# Patient Record
Sex: Male | Born: 1945
Health system: Southern US, Community
[De-identification: ages and names within clinical notes are randomized; demographics above are authoritative.]

## PROBLEM LIST (undated history)

## (undated) DIAGNOSIS — H269 Unspecified cataract: Secondary | ICD-10-CM

## (undated) DIAGNOSIS — I10 Essential (primary) hypertension: Secondary | ICD-10-CM

## (undated) DIAGNOSIS — E559 Vitamin D deficiency, unspecified: Secondary | ICD-10-CM

## (undated) DIAGNOSIS — Z860100 Personal history of colon polyps, unspecified: Secondary | ICD-10-CM

## (undated) DIAGNOSIS — M542 Cervicalgia: Secondary | ICD-10-CM

## (undated) DIAGNOSIS — E785 Hyperlipidemia, unspecified: Secondary | ICD-10-CM

## (undated) DIAGNOSIS — M1990 Unspecified osteoarthritis, unspecified site: Secondary | ICD-10-CM

## (undated) DIAGNOSIS — D696 Thrombocytopenia, unspecified: Secondary | ICD-10-CM

## (undated) HISTORY — PX: CATARACT EXTRACTION: SUR2

## (undated) HISTORY — DX: Personal history of colonic polyps: Z86.010

## (undated) HISTORY — DX: Vitamin D deficiency, unspecified: E55.9

## (undated) HISTORY — DX: Personal history of colon polyps, unspecified: Z86.0100

## (undated) HISTORY — DX: Cervicalgia: M54.2

## (undated) HISTORY — DX: Hyperlipidemia, unspecified: E78.5

## (undated) HISTORY — PX: CARDIAC CATHETERIZATION: SHX172

## (undated) HISTORY — DX: Essential (primary) hypertension: I10

## (undated) HISTORY — DX: Thrombocytopenia, unspecified: D69.6

## (undated) HISTORY — DX: Unspecified cataract: H26.9

---

## 2013-07-14 DIAGNOSIS — I10 Essential (primary) hypertension: Secondary | ICD-10-CM | POA: Diagnosis not present

## 2013-07-14 DIAGNOSIS — D126 Benign neoplasm of colon, unspecified: Secondary | ICD-10-CM | POA: Diagnosis not present

## 2013-07-14 DIAGNOSIS — Z86010 Personal history of colonic polyps: Secondary | ICD-10-CM | POA: Diagnosis not present

## 2013-08-27 DIAGNOSIS — Z23 Encounter for immunization: Secondary | ICD-10-CM | POA: Diagnosis not present

## 2013-08-31 DIAGNOSIS — H2510 Age-related nuclear cataract, unspecified eye: Secondary | ICD-10-CM | POA: Diagnosis not present

## 2013-08-31 DIAGNOSIS — H268 Other specified cataract: Secondary | ICD-10-CM | POA: Diagnosis not present

## 2013-09-15 DIAGNOSIS — D485 Neoplasm of uncertain behavior of skin: Secondary | ICD-10-CM | POA: Diagnosis not present

## 2013-09-15 DIAGNOSIS — Z85828 Personal history of other malignant neoplasm of skin: Secondary | ICD-10-CM | POA: Diagnosis not present

## 2013-09-15 DIAGNOSIS — L821 Other seborrheic keratosis: Secondary | ICD-10-CM | POA: Diagnosis not present

## 2013-09-15 DIAGNOSIS — L570 Actinic keratosis: Secondary | ICD-10-CM | POA: Diagnosis not present

## 2013-11-04 DIAGNOSIS — E785 Hyperlipidemia, unspecified: Secondary | ICD-10-CM | POA: Diagnosis not present

## 2013-11-09 DIAGNOSIS — I10 Essential (primary) hypertension: Secondary | ICD-10-CM | POA: Diagnosis not present

## 2013-11-09 DIAGNOSIS — E559 Vitamin D deficiency, unspecified: Secondary | ICD-10-CM | POA: Diagnosis not present

## 2013-11-09 DIAGNOSIS — E785 Hyperlipidemia, unspecified: Secondary | ICD-10-CM | POA: Diagnosis not present

## 2013-11-09 DIAGNOSIS — Z0000 Encounter for general adult medical examination without abnormal findings: Secondary | ICD-10-CM | POA: Diagnosis not present

## 2014-03-16 DIAGNOSIS — D2360 Other benign neoplasm of skin of unspecified upper limb, including shoulder: Secondary | ICD-10-CM | POA: Diagnosis not present

## 2014-03-16 DIAGNOSIS — L255 Unspecified contact dermatitis due to plants, except food: Secondary | ICD-10-CM | POA: Diagnosis not present

## 2014-03-16 DIAGNOSIS — Z85828 Personal history of other malignant neoplasm of skin: Secondary | ICD-10-CM | POA: Diagnosis not present

## 2014-03-16 DIAGNOSIS — L570 Actinic keratosis: Secondary | ICD-10-CM | POA: Diagnosis not present

## 2014-05-03 DIAGNOSIS — E785 Hyperlipidemia, unspecified: Secondary | ICD-10-CM | POA: Diagnosis not present

## 2014-05-03 DIAGNOSIS — E559 Vitamin D deficiency, unspecified: Secondary | ICD-10-CM | POA: Diagnosis not present

## 2014-05-03 DIAGNOSIS — Z125 Encounter for screening for malignant neoplasm of prostate: Secondary | ICD-10-CM | POA: Diagnosis not present

## 2014-05-10 DIAGNOSIS — Z0000 Encounter for general adult medical examination without abnormal findings: Secondary | ICD-10-CM | POA: Diagnosis not present

## 2014-08-20 DIAGNOSIS — Z23 Encounter for immunization: Secondary | ICD-10-CM | POA: Diagnosis not present

## 2014-10-15 DIAGNOSIS — L821 Other seborrheic keratosis: Secondary | ICD-10-CM | POA: Diagnosis not present

## 2014-10-15 DIAGNOSIS — L570 Actinic keratosis: Secondary | ICD-10-CM | POA: Diagnosis not present

## 2014-10-15 DIAGNOSIS — D225 Melanocytic nevi of trunk: Secondary | ICD-10-CM | POA: Diagnosis not present

## 2014-10-15 DIAGNOSIS — L820 Inflamed seborrheic keratosis: Secondary | ICD-10-CM | POA: Diagnosis not present

## 2014-10-15 DIAGNOSIS — D1801 Hemangioma of skin and subcutaneous tissue: Secondary | ICD-10-CM | POA: Diagnosis not present

## 2014-10-15 DIAGNOSIS — C44519 Basal cell carcinoma of skin of other part of trunk: Secondary | ICD-10-CM | POA: Diagnosis not present

## 2014-12-13 ENCOUNTER — Ambulatory Visit: Payer: Medicare Other | Admitting: Internal Medicine

## 2014-12-17 ENCOUNTER — Ambulatory Visit (INDEPENDENT_AMBULATORY_CARE_PROVIDER_SITE_OTHER): Payer: Medicare Other | Admitting: Internal Medicine

## 2014-12-17 ENCOUNTER — Encounter: Payer: Self-pay | Admitting: Internal Medicine

## 2014-12-17 VITALS — BP 118/70 | HR 60 | Temp 97.1°F | Resp 18 | Ht 73.78 in | Wt 190.6 lb

## 2014-12-17 DIAGNOSIS — D696 Thrombocytopenia, unspecified: Secondary | ICD-10-CM | POA: Diagnosis not present

## 2014-12-17 DIAGNOSIS — Z860100 Personal history of colon polyps, unspecified: Secondary | ICD-10-CM | POA: Insufficient documentation

## 2014-12-17 DIAGNOSIS — Z7189 Other specified counseling: Secondary | ICD-10-CM | POA: Insufficient documentation

## 2014-12-17 DIAGNOSIS — E559 Vitamin D deficiency, unspecified: Secondary | ICD-10-CM | POA: Diagnosis not present

## 2014-12-17 DIAGNOSIS — E785 Hyperlipidemia, unspecified: Secondary | ICD-10-CM | POA: Insufficient documentation

## 2014-12-17 DIAGNOSIS — I10 Essential (primary) hypertension: Secondary | ICD-10-CM | POA: Diagnosis not present

## 2014-12-17 NOTE — Progress Notes (Signed)
Patient ID: Brandon [name], male   DOB: 03-12-1946, 69 y.o.   MRN: [id number]   Location:  [facility]  Code Status: full code at present--was discussed in detail and says if he was resuscitated and did not recover well, then he would expect his living will to be put into effect  Advanced Directive information Does patient have an advance directive?: Yes, Type of Advance Directive: [place], Does patient want to make changes to advanced directive?: No - Patient declined   Allergies  Allergen Reactions  . Niaspan [Niacin Er]   . Zocor [Simvastatin]     Chief Complaint  Patient presents with  . Establish Care    HPI: Patient is a 69 y.o. male seen in the office today to establish with the practice.  Moved from [place], [place] in early June.  Had been living rurally far from hospitals and they have a son in [place].  Prior PCP Pamala Hurry.  Pt is a retired Gaffer.  He is now in walking distance.    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  Has been seen at [facility] previously.    2005-7ish, was hospitalized with irregular heart rate.  Thinks he got medication and it went back into sinus rhythm.  About a year or two later, felt funny with a pressure in the chest and had a cath at that time.  No significant blockages per pt.  Monitored.  Got a cardiologist he followed with for 2-3 years.  No recurrence since.  Primary care has put him on bp and cholesterol medication.  On hctz in addition to lisinopril and has been at goal since.  Pt's wife noted some memory loss when he was on zocor.  Something new started and he got hives (?this was the niacin).  Doing fenofibrate after that and has tolerated it.    Colonoscopy:  Has his records for those--2011 with polyps. 2014 was told to return in 5 years (2019). We need those records also.    Dermatology:  Martin Majestic to one and to sue 2 wks of efudex cream.    Uses some  [facility] rx meds.  Advised to reorder his meds.    Concerned about short term memory loss.  Has living will and hcpoa he and his wife just did.  Says he's in good health.  Asks about DNR.  His mother had circulatory problems, ended up on HD, was having mini strokes.  Decided to stop HD.  His father committed suicide.  Pt does not want to linger, does not want to be a burden, and wants to maintain good quality of life.  If he has dementia, he wants the plug pulled asap.    Review of Systems:  Review of Systems  Constitutional: Negative for fever, chills and malaise/fatigue.  HENT: Negative for hearing loss.   Eyes:       Wears glasses  Respiratory: Negative for shortness of breath.   Cardiovascular: Negative for chest pain and leg swelling.  Gastrointestinal: Negative for abdominal pain, constipation, blood in stool and melena.  Genitourinary: Negative for dysuria.  Musculoskeletal: Negative for falls.  Skin: Negative for rash.  Neurological: Negative for dizziness, loss of consciousness, weakness and headaches.  Psychiatric/Behavioral: Positive for memory loss. Negative for depression.    Past Medical History  Diagnosis Date  . Hypertension   . Vitamin D deficiency   . Thrombocytopenia   . History of colonic polyps   .  Hyperlipidemia     No past surgical history on file.  Social History:   reports that he has never smoked. He does not have any smokeless tobacco history on file. He reports that he drinks alcohol. He reports that he does not use illicit drugs.  Family History  Problem Relation Age of Onset  . Clotting disorder Father   . Suicidality Father   . High Cholesterol Mother   . Renal Disease Mother   . Stroke Mother 33    Medications: Patient's Medications  New Prescriptions   No medications on file  Previous Medications   ASPIRIN EC 81 MG TABLET    Take 81 mg by mouth daily.   CHOLECALCIFEROL (VITAMIN D3) 2000 UNITS TABS    Take 1 tablet by mouth daily.     FENOFIBRATE 160 MG TABLET    Take 160 mg by mouth daily.   FLAXSEED, LINSEED, (FLAX SEED OIL) 1000 MG CAPS    Take 1 capsule by mouth daily.    FLUOROURACIL (EFUDEX) 5 % CREAM    Apply topically as needed.   HYDROCHLOROTHIAZIDE (HYDRODIURIL) 25 MG TABLET    Take 25 mg by mouth daily.   LISINOPRIL (PRINIVIL,ZESTRIL) 40 MG TABLET    Take 40 mg by mouth daily.   VITAMIN B-12 (CYANOCOBALAMIN) 100 MCG TABLET    Take 100 mcg by mouth daily.  Modified Medications   No medications on file  Discontinued Medications   No medications on file     Physical Exam: Filed Vitals:   12/17/14 1038  BP: 118/70  Pulse: 60  Temp: 97.1 F (36.2 C)  TempSrc: Oral  Resp: 18  Height: 6' 1.78" (1.874 m)  Weight: 190 lb 9.6 oz (86.456 kg)  SpO2: 97%  Physical Exam  Constitutional: He is oriented to person, place, and time. He appears well-developed and well-nourished. No distress.  Cardiovascular: Normal rate, regular rhythm, normal heart sounds and intact distal pulses.   Pulmonary/Chest: Effort normal and breath sounds normal. No respiratory distress.  Abdominal: Bowel sounds are normal.  Musculoskeletal: Normal range of motion.  Ambulatory w/o assistive device  Neurological: He is alert and oriented to person, place, and time.  Skin: Skin is warm and dry.  Psychiatric:  anxious   Labs reviewed: Need records  Assessment/Plan 1. Essential hypertension -bp at goal with hctz, lisinopril  2. Vitamin D deficiency -cont vitamin D 2000 units daily  3. Thrombocytopenia -f/u cbc--unclear what cause was until we receive his records - CBC with Differential/Platelet; Future  4. History of colonic polyps -has had these on first cscope and had repeat in 3 years, next due in 2019 he thinks--again await records  5. Hyperlipidemia -cont fenofibrate, flax seed oil and reassess - Comprehensive metabolic panel; Future - Lipid panel; Future  6. Counseling regarding advanced care planning and goals of  care -had extensive discussion about code status orders, living will and hcpoa -he has decided to maintain full code status due to his currently excellent qol and health, but we will discuss again should his health circumstances decline -he has a living will  Labs/tests ordered:   Orders Placed This Encounter  Procedures  . CBC with Differential/Platelet    Standing Status: Future     Number of Occurrences:      Standing Expiration Date: 06/17/2015  . Comprehensive metabolic panel    Standing Status: Future     Number of Occurrences:      Standing Expiration Date: 06/17/2015    Order Specific  Question:  Has the patient fasted?    Answer:  Yes  . Lipid panel    Standing Status: Future     Number of Occurrences:      Standing Expiration Date: 06/17/2015    Order Specific Question:  Has the patient fasted?    Answer:  Yes   We do not yet have any vaccination records or any other preventive care info  Next appt:  About 2 mos (next available physical slot)  Mckinze Poirier L. Marisol Glazer, D.O. [facility] [address], [place] [address] Cell Phone (Mon-Fri 8am-5pm):  [phone] On Call:  [phone] & follow prompts after 5pm & weekends Office Phone:  [phone] Office Fax:  [phone]

## 2014-12-17 NOTE — Patient Instructions (Signed)
Please bring [place] a copy of your health care power of attorney paperwork.

## 2015-03-17 ENCOUNTER — Other Ambulatory Visit: Payer: Self-pay | Admitting: *Deleted

## 2015-03-17 ENCOUNTER — Other Ambulatory Visit: Payer: Medicare Other

## 2015-03-17 DIAGNOSIS — I10 Essential (primary) hypertension: Secondary | ICD-10-CM | POA: Diagnosis not present

## 2015-03-17 DIAGNOSIS — D696 Thrombocytopenia, unspecified: Secondary | ICD-10-CM

## 2015-03-17 DIAGNOSIS — E785 Hyperlipidemia, unspecified: Secondary | ICD-10-CM

## 2015-03-18 LAB — LIPID PANEL
Chol/HDL Ratio: 2.2 ratio units (ref 0.0–5.0)
Cholesterol, Total: 115 mg/dL (ref 100–199)
HDL: 53 mg/dL (ref >39)
LDL Calculated: 53 mg/dL (ref 0–99)
Triglycerides: 44 mg/dL (ref 0–149)
VLDL Cholesterol Cal: 9 mg/dL (ref 5–40)

## 2015-03-18 LAB — COMPREHENSIVE METABOLIC PANEL
ALT: 18 IU/L (ref 0–44)
AST: 24 IU/L (ref 0–40)
Albumin/Globulin Ratio: 2.2 (ref 1.1–2.5)
Albumin: 3.9 g/dL (ref 3.6–4.8)
Alkaline Phosphatase: 34 IU/L — ABNORMAL LOW (ref 39–117)
BUN/Creatinine Ratio: 20 (ref 10–22)
BUN: 22 mg/dL (ref 8–27)
Bilirubin Total: 0.5 mg/dL (ref 0.0–1.2)
CO2: 22 mmol/L (ref 18–29)
Calcium: 9.1 mg/dL (ref 8.6–10.2)
Chloride: 104 mmol/L (ref 97–108)
Creatinine, Ser: 1.08 mg/dL (ref 0.76–1.27)
GFR calc Af Amer: 82 mL/min/{1.73_m2} (ref >59)
GFR calc non Af Amer: 71 mL/min/{1.73_m2} (ref >59)
Globulin, Total: 1.8 g/dL (ref 1.5–4.5)
Glucose: 81 mg/dL (ref 65–99)
Potassium: 4 mmol/L (ref 3.5–5.2)
Sodium: 143 mmol/L (ref 134–144)
Total Protein: 5.7 g/dL — ABNORMAL LOW (ref 6.0–8.5)

## 2015-03-18 LAB — CBC WITH DIFFERENTIAL/PLATELET
Basophils Absolute: 0 10*3/uL (ref 0.0–0.2)
Basos: 1 %
EOS (ABSOLUTE): 0.1 10*3/uL (ref 0.0–0.4)
Eos: 3 %
Hematocrit: 43.1 % (ref 37.5–51.0)
Hemoglobin: 14.6 g/dL (ref 12.6–17.7)
Immature Grans (Abs): 0 10*3/uL (ref 0.0–0.1)
Immature Granulocytes: 0 %
Lymphocytes Absolute: 1.2 10*3/uL (ref 0.7–3.1)
Lymphs: 27 %
MCH: 30.2 pg (ref 26.6–33.0)
MCHC: 33.9 g/dL (ref 31.5–35.7)
MCV: 89 fL (ref 79–97)
Monocytes Absolute: 0.4 10*3/uL (ref 0.1–0.9)
Monocytes: 8 %
Neutrophils Absolute: 2.7 10*3/uL (ref 1.4–7.0)
Neutrophils: 61 %
Platelets: 133 10*3/uL — ABNORMAL LOW (ref 150–379)
RBC: 4.84 x10E6/uL (ref 4.14–5.80)
RDW: 13.4 % (ref 12.3–15.4)
WBC: 4.4 10*3/uL (ref 3.4–10.8)

## 2015-03-21 ENCOUNTER — Encounter: Payer: Self-pay | Admitting: Internal Medicine

## 2015-03-21 ENCOUNTER — Ambulatory Visit (INDEPENDENT_AMBULATORY_CARE_PROVIDER_SITE_OTHER): Payer: Medicare Other | Admitting: Internal Medicine

## 2015-03-21 VITALS — BP 110/78 | HR 64 | Temp 97.6°F | Resp 18 | Ht 74.0 in | Wt 184.8 lb

## 2015-03-21 DIAGNOSIS — Z86010 Personal history of colonic polyps: Secondary | ICD-10-CM

## 2015-03-21 DIAGNOSIS — Z0100 Encounter for examination of eyes and vision without abnormal findings: Secondary | ICD-10-CM | POA: Diagnosis not present

## 2015-03-21 DIAGNOSIS — E785 Hyperlipidemia, unspecified: Secondary | ICD-10-CM | POA: Diagnosis not present

## 2015-03-21 DIAGNOSIS — I10 Essential (primary) hypertension: Secondary | ICD-10-CM | POA: Diagnosis not present

## 2015-03-21 DIAGNOSIS — D696 Thrombocytopenia, unspecified: Secondary | ICD-10-CM

## 2015-03-21 DIAGNOSIS — Z23 Encounter for immunization: Secondary | ICD-10-CM | POA: Diagnosis not present

## 2015-03-21 DIAGNOSIS — C4442 Squamous cell carcinoma of skin of scalp and neck: Secondary | ICD-10-CM | POA: Diagnosis not present

## 2015-03-21 DIAGNOSIS — M25551 Pain in right hip: Secondary | ICD-10-CM | POA: Diagnosis not present

## 2015-03-21 MED ORDER — FENOFIBRATE 160 MG PO TABS: 160.0000 mg | ORAL_TABLET | Freq: Every day | ORAL | Status: DC

## 2015-03-21 MED ORDER — HYDROCHLOROTHIAZIDE 25 MG PO TABS: 25.0000 mg | ORAL_TABLET | Freq: Every day | ORAL | Status: DC

## 2015-03-21 MED ORDER — LISINOPRIL 40 MG PO TABS: 40.0000 mg | ORAL_TABLET | Freq: Every day | ORAL | Status: DC

## 2015-03-21 NOTE — Progress Notes (Signed)
Passed clock test 

## 2015-03-21 NOTE — Progress Notes (Signed)
Patient ID: Brandon [name], male   DOB: Jan 12, 1946, 69 y.o.   MRN: [id number]   Location:  [facility]  Code Status: full code Goals of Care: Advanced Directive information Does patient have an advance directive?: Yes, Type of Advance Directive: [place];Living will, Does patient want to make changes to advanced directive?: No - Patient declined   Chief Complaint  Patient presents with  . Annual Exam    Annual exam    HPI: Patient is a 69 y.o. white male seen in the office today for his annual exam.  Nothing is new to speak of.  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    1.  Ophthalmology--his wife is Company secretary.  Needs referral. 2.  Colonoscopy:  Due Sept 2019 as had one polyp in 9/14 3.  BP log:  Excellent--range from 81-121/58-81; HR 58-75--using automatic cuff.  Has not felt weak, fatigued, dizzy, lightheaded.   4.  Has a dentist and dermatologist [facility]).  Used two weeks of efudex cream on top of head.  Thinks it was effective.  Didn't want to put on forehead or sides of face.  Is using sunscreen and wearing hat.  Had place frozen on left temple near eyebrow that didn't work and another on right temple that was successful.  Had been there in December.   5.  ACO:  Explained THN to him. 6.  Vaccinations:  Had zostavax.  Has always gotten his flu shots.  Had tdap.  Prevnar can be given today and pneumovax in 11 mos.    Review of Systems:  Review of Systems  Constitutional: Negative for fever, chills, weight loss and malaise/fatigue.  HENT: Negative for congestion and hearing loss.   Eyes: Negative for blurred vision.       Glasses  Respiratory: Negative for cough and shortness of breath.   Cardiovascular: Negative for chest pain and leg swelling.    Gastrointestinal: Negative for heartburn, abdominal pain, constipation, blood in stool and melena.  Genitourinary: Negative for dysuria, urgency, frequency and hematuria.  Musculoskeletal: Negative for myalgias and falls.  Skin: Negative for rash.  Neurological: Negative for dizziness, loss of consciousness, weakness and headaches.  Endo/Heme/Allergies: Does not bruise/bleed easily.  Psychiatric/Behavioral: Negative for depression and memory loss.    Past Medical History  Diagnosis Date  . Hypertension   . Vitamin D deficiency   . Thrombocytopenia   . History of colonic polyps   . Hyperlipidemia     History reviewed. No pertinent past surgical history.  Allergies  Allergen Reactions  . Zocor [Simvastatin] Other (See Comments)    Memory loss  . Niaspan [Niacin Er]    Medications: Patient's Medications  New Prescriptions   No medications on file  Previous Medications   ASPIRIN EC 81 MG TABLET    Take 81 mg by mouth daily.   CHOLECALCIFEROL (VITAMIN D3) 2000 UNITS TABS    Take 1 tablet by mouth daily.    FENOFIBRATE 160 MG TABLET    Take 160 mg by mouth daily.   FLAXSEED, LINSEED, (FLAX SEED OIL) 1000 MG CAPS    Take 1 capsule by mouth daily.    FLUOROURACIL (EFUDEX) 5 % CREAM    Apply topically as needed.   HYDROCHLOROTHIAZIDE (HYDRODIURIL) 25 MG TABLET    Take 25 mg by  mouth daily.   LISINOPRIL (PRINIVIL,ZESTRIL) 40 MG TABLET    Take 40 mg by mouth daily.   VITAMIN B-12 (CYANOCOBALAMIN) 100 MCG TABLET    Take 100 mcg by mouth daily.  Modified Medications   No medications on file  Discontinued Medications   No medications on file    Physical Exam: Filed Vitals:   03/21/15 0846  BP: 110/78  Pulse: 64  Temp: 97.6 F (36.4 C)  TempSrc: Oral  Resp: 18  Height: 6\' 2"  (1.88 m)  Weight: 184 lb 12.8 oz (83.825 kg)  SpO2: 96%   Physical Exam  Constitutional: He is oriented to person, place, and time. He appears well-developed and well-nourished. No distress.  HENT:   Head: Normocephalic and atraumatic.  Right Ear: External ear normal.  Left Ear: External ear normal.  Nose: Nose normal.  Mouth/Throat: Oropharynx is clear and moist. No oropharyngeal exudate.  Some wax in bilateral canals, left greater than right  Eyes: Conjunctivae and EOM are normal. Pupils are equal, round, and reactive to light.  Neck: Normal range of motion. Neck supple. No JVD present. No thyromegaly present.  Cardiovascular: Normal rate, regular rhythm, normal heart sounds and intact distal pulses.   Pulmonary/Chest: Effort normal and breath sounds normal. No respiratory distress.  Abdominal: Soft. Bowel sounds are normal. He exhibits no distension and no mass. There is no tenderness.  Musculoskeletal: Normal range of motion. He exhibits no edema or tenderness.  Neurological: He is alert and oriented to person, place, and time. No cranial nerve deficit. He exhibits normal muscle tone. Coordination normal.  2+DTR left, 1+ right  Skin: Skin is warm and dry.  Psychiatric: He has a normal mood and affect. His behavior is normal. Judgment and thought content normal.  A bit anxious, talks fast    Labs reviewed: Basic Metabolic Panel:  Recent Labs  03/17/15 0901  NA 143  K 4.0  CL 104  CO2 22  GLUCOSE 81  BUN 22  CREATININE 1.08  CALCIUM 9.1   Liver Function Tests:  Recent Labs  03/17/15 0901  AST 24  ALT 18  ALKPHOS 34*  BILITOT 0.5  PROT 5.7*   CBC:  Recent Labs  03/17/15 0901  WBC 4.4  NEUTROABS 2.7  HCT 43.1   Lipid Panel:  Recent Labs  03/17/15 0901  CHOL 115  HDL 53  LDLCALC 53  TRIG 44  CHOLHDL 2.2    Assessment/Plan 1. Essential hypertension -bp at goal with lisinopril 40mg  daily and hctz 25mg  daily  2. Thrombocytopenia -platelets were slightly low which has been chronic  3. Hyperlipidemia -lipids at goal  -cont fenofibrate 160mg  daily  4. Squamous cell carcinoma of scalp -has completed treated with efudex and will keep annual  f/u with [facility]  5. Right hip pain -unclear for sure if this is bursitis vs. OA vs. Coming from his back -he noted this after walking longer distances, but does not have any now to complete a complete exam about it -advised to let me know if returns  6. History of colonic polyps - due 07/14/18 next due to single polyp on last cscope in [place] in 2014  7. Encounter for vision screening - Ambulatory referral to Ophthalmology for routine exam, wears glasses  8. Need for vaccination with 13-polyvalent pneumococcal conjugate vaccine -prevnar given  Labs/tests ordered:  Will order fasting labs the morning of his appt so he does not have to come in twice.  Next appt:  6 mos with labs  day of  Rithwik Schmieg L. Danely Bayliss, D.O. [facility] [address], [place] [address] Cell Phone (Mon-Fri 8am-5pm):  [phone] On Call:  [phone] & follow prompts after 5pm & weekends Office Phone:  [phone] Office Fax:  [phone]

## 2015-03-29 ENCOUNTER — Encounter: Payer: Self-pay | Admitting: Internal Medicine

## 2015-03-29 ENCOUNTER — Ambulatory Visit (INDEPENDENT_AMBULATORY_CARE_PROVIDER_SITE_OTHER): Payer: Medicare Other | Admitting: Internal Medicine

## 2015-03-29 VITALS — BP 110/78 | HR 58 | Temp 97.9°F | Resp 20 | Ht 74.0 in | Wt 182.6 lb

## 2015-03-29 DIAGNOSIS — M542 Cervicalgia: Secondary | ICD-10-CM | POA: Insufficient documentation

## 2015-03-29 HISTORY — DX: Cervicalgia: M54.2

## 2015-03-29 MED ORDER — HYDROCODONE-ACETAMINOPHEN 5-325 MG PO TABS: ORAL_TABLET | ORAL | Status: DC

## 2015-03-29 MED ORDER — CYCLOBENZAPRINE HCL 10 MG PO TABS: ORAL_TABLET | ORAL | Status: DC

## 2015-03-29 NOTE — Progress Notes (Signed)
Patient ID: Brandon [name], male   DOB: Jun 15, 1946, 69 y.o.   MRN: [id number]    Facility  [facility]    Place of Service:   OFFICE    Allergies  Allergen Reactions  . Zocor [Simvastatin] Other (See Comments)    Memory loss  . Niaspan [Niacin Er]     Chief Complaint  Patient presents with  . Acute Visit    Patient c/o He is complaining of neck pain on right side , says pain started last tuesday     HPI:  Having right-sided neck pain. He was taking [place] drain ditch over the last month. He's had no falls or other injuries. Started hurting during a drive to [facility]. Use ibuprofen as gave little relief. He has trouble looking over his right shoulder.  Medications: Patient's Medications  New Prescriptions   No medications on file  Previous Medications   ASPIRIN EC 81 MG TABLET    Take 81 mg by mouth daily.   CHOLECALCIFEROL (VITAMIN D3) 2000 UNITS TABS    Take 1 tablet by mouth daily.    FENOFIBRATE 160 MG TABLET    Take 1 tablet (160 mg total) by mouth daily.   FLAXSEED, LINSEED, (FLAX SEED OIL) 1000 MG CAPS    Take 1 capsule by mouth daily.    HYDROCHLOROTHIAZIDE (HYDRODIURIL) 25 MG TABLET    Take 1 tablet (25 mg total) by mouth daily.   LISINOPRIL (PRINIVIL,ZESTRIL) 40 MG TABLET    Take 1 tablet (40 mg total) by mouth daily.   VITAMIN B-12 (CYANOCOBALAMIN) 100 MCG TABLET    Take 100 mcg by mouth daily.  Modified Medications   No medications on file  Discontinued Medications   No medications on file     Review of Systems  Constitutional: Negative for fever and chills.  HENT: Negative for congestion and hearing loss.   Eyes:       Glasses  Respiratory: Negative for cough and shortness of breath.   Cardiovascular: Negative for chest pain and leg swelling.  Gastrointestinal: Negative for abdominal pain, constipation and blood in stool.  Genitourinary: Negative for dysuria, urgency, frequency and hematuria.  Musculoskeletal: Positive for neck pain and neck stiffness.  Skin:  Negative for rash.  Neurological: Negative for dizziness, weakness and headaches.  Hematological: Does not bruise/bleed easily.    Filed Vitals:   03/29/15 1657  BP: 110/78  Pulse: 58  Temp: 97.9 F (36.6 C)  TempSrc: Oral  Resp: 20  Height: 6\' 2"  (1.88 m)  Weight: 182 lb 9.6 oz (82.827 kg)  SpO2: 98%   Body mass index is 23.43 kg/(m^2).  Physical Exam  Constitutional: He is oriented to person, place, and time. He appears well-developed and well-nourished. No distress.  HENT:  Head: Normocephalic and atraumatic.  Right Ear: External ear normal.  Left Ear: External ear normal.  Nose: Nose normal.  Mouth/Throat: Oropharynx is clear and moist. No oropharyngeal exudate.  Some wax in bilateral canals, left greater than right  Eyes: Conjunctivae and EOM are normal. Pupils are equal, round, and reactive to light.  Neck: Normal range of motion. Neck supple. No JVD present. No thyromegaly present.  Cardiovascular: Normal rate, regular rhythm, normal heart sounds and intact distal pulses.   Pulmonary/Chest: Effort normal and breath sounds normal. No respiratory distress.  Abdominal: Soft. Bowel sounds are normal. He exhibits no distension and no mass. There is no tenderness.  Musculoskeletal: Normal range of motion. He exhibits tenderness (right neck). He exhibits no edema.  Painful area of the right neck on sternocleidomastoid and trapezoid  Neurological: He is alert and oriented to person, place, and time. No cranial nerve deficit. He exhibits normal muscle tone. Coordination normal.  2+DTR left, 1+ right  Skin: Skin is warm and dry.  Psychiatric: He has a normal mood and affect. His behavior is normal. Judgment and thought content normal.  A bit anxious, talks fast     Labs reviewed: Lab on 03/17/2015  Component Date Value Ref Range Status  . WBC 03/17/2015 4.4  3.4 - 10.8 x10E3/uL Final  . RBC 03/17/2015 4.84  4.14 - 5.80 x10E6/uL Final  . Hemoglobin 03/17/2015 14.6  12.6 -  17.7 g/dL Final  . Hematocrit 03/17/2015 43.1  37.5 - 51.0 % Final  . MCV 03/17/2015 89  79 - 97 fL Final  . MCH 03/17/2015 30.2  26.6 - 33.0 pg Final  . MCHC 03/17/2015 33.9  31.5 - 35.7 g/dL Final  . RDW 03/17/2015 13.4  12.3 - 15.4 % Final  . Platelets 03/17/2015 133* 150 - 379 x10E3/uL Final  . NEUTROPHILS 03/17/2015 61   Final  . Lymphs 03/17/2015 27   Final  . Monocytes 03/17/2015 8   Final  . Eos 03/17/2015 3   Final  . Basos 03/17/2015 1   Final  . Neutrophils Absolute 03/17/2015 2.7  1.4 - 7.0 x10E3/uL Final  . Lymphocytes Absolute 03/17/2015 1.2  0.7 - 3.1 x10E3/uL Final  . Monocytes Absolute 03/17/2015 0.4  0.1 - 0.9 x10E3/uL Final  . EOS (ABSOLUTE) 03/17/2015 0.1  0.0 - 0.4 x10E3/uL Final  . Basophils Absolute 03/17/2015 0.0  0.0 - 0.2 x10E3/uL Final  . Immature Granulocytes 03/17/2015 0   Final  . Immature Grans (Abs) 03/17/2015 0.0  0.0 - 0.1 x10E3/uL Final  . Glucose 03/17/2015 81  65 - 99 mg/dL Final  . BUN 03/17/2015 22  8 - 27 mg/dL Final  . Creatinine, Ser 03/17/2015 1.08  0.76 - 1.27 mg/dL Final  . GFR calc non Af Amer 03/17/2015 71  >59 mL/min/1.73 Final  . GFR calc Af Amer 03/17/2015 82  >59 mL/min/1.73 Final  . BUN/Creatinine Ratio 03/17/2015 20  10 - 22 Final  . Sodium 03/17/2015 143  134 - 144 mmol/L Final  . Potassium 03/17/2015 4.0  3.5 - 5.2 mmol/L Final  . Chloride 03/17/2015 104  97 - 108 mmol/L Final  . CO2 03/17/2015 22  18 - 29 mmol/L Final  . Calcium 03/17/2015 9.1  8.6 - 10.2 mg/dL Final  . Total Protein 03/17/2015 5.7* 6.0 - 8.5 g/dL Final  . Albumin 03/17/2015 3.9  3.6 - 4.8 g/dL Final  . Globulin, Total 03/17/2015 1.8  1.5 - 4.5 g/dL Final  . Albumin/Globulin Ratio 03/17/2015 2.2  1.1 - 2.5 Final  . Bilirubin Total 03/17/2015 0.5  0.0 - 1.2 mg/dL Final  . Alkaline Phosphatase 03/17/2015 34* 39 - 117 IU/L Final  . AST 03/17/2015 24  0 - 40 IU/L Final  . ALT 03/17/2015 18  0 - 44 IU/L Final  . Cholesterol, Total 03/17/2015 115  100 - 199 mg/dL  Final  . Triglycerides 03/17/2015 44  0 - 149 mg/dL Final  . HDL 03/17/2015 53  >39 mg/dL Final   Comment: According to ATP-III Guidelines, HDL-C >59 mg/dL is considered a negative risk factor for CHD.   Marland Kitchen VLDL Cholesterol Cal 03/17/2015 9  5 - 40 mg/dL Final  . LDL Calculated 03/17/2015 53  0 - 99 mg/dL Final  .  Chol/HDL Ratio 03/17/2015 2.2  0.0 - 5.0 ratio units Final   Comment:                                   T. Chol/HDL Ratio                                             Men  Women                               1/2 Avg.Risk  3.4    3.3                                   Avg.Risk  5.0    4.4                                2X Avg.Risk  9.6    7.1                                3X Avg.Risk 23.4   11.0      Assessment/Plan  1. Cervicalgia - cyclobenzaprine (FLEXERIL) 10 MG tablet; One tablet 2-3 times daily to relax muscles  Dispense: 30 tablet; Refill: 0 - HYDROcodone-acetaminophen (NORCO/VICODIN) 5-325 MG per tablet; One every 4 hours if needed for pain  Dispense: 60 tablet; Refill: 0

## 2015-04-06 ENCOUNTER — Encounter: Payer: Self-pay | Admitting: Internal Medicine

## 2015-04-15 DIAGNOSIS — D3131 Benign neoplasm of right choroid: Secondary | ICD-10-CM | POA: Diagnosis not present

## 2015-04-15 DIAGNOSIS — H2513 Age-related nuclear cataract, bilateral: Secondary | ICD-10-CM | POA: Diagnosis not present

## 2015-04-15 DIAGNOSIS — H1789 Other corneal scars and opacities: Secondary | ICD-10-CM | POA: Diagnosis not present

## 2015-04-18 ENCOUNTER — Encounter: Payer: Self-pay | Admitting: Internal Medicine

## 2015-06-23 ENCOUNTER — Ambulatory Visit: Payer: Medicare Other | Admitting: Internal Medicine

## 2015-07-25 ENCOUNTER — Ambulatory Visit (INDEPENDENT_AMBULATORY_CARE_PROVIDER_SITE_OTHER): Payer: Medicare Other

## 2015-07-25 DIAGNOSIS — Z23 Encounter for immunization: Secondary | ICD-10-CM | POA: Diagnosis not present

## 2015-09-26 ENCOUNTER — Ambulatory Visit (INDEPENDENT_AMBULATORY_CARE_PROVIDER_SITE_OTHER): Payer: Medicare Other | Admitting: Internal Medicine

## 2015-09-26 ENCOUNTER — Encounter: Payer: Self-pay | Admitting: Internal Medicine

## 2015-09-26 VITALS — BP 110/80 | HR 67 | Temp 97.4°F | Resp 18 | Ht 74.0 in | Wt 192.4 lb

## 2015-09-26 DIAGNOSIS — D696 Thrombocytopenia, unspecified: Secondary | ICD-10-CM

## 2015-09-26 DIAGNOSIS — L910 Hypertrophic scar: Secondary | ICD-10-CM | POA: Diagnosis not present

## 2015-09-26 DIAGNOSIS — I10 Essential (primary) hypertension: Secondary | ICD-10-CM

## 2015-09-26 DIAGNOSIS — E785 Hyperlipidemia, unspecified: Secondary | ICD-10-CM

## 2015-09-26 DIAGNOSIS — M6588 Other synovitis and tenosynovitis, other site: Secondary | ICD-10-CM | POA: Diagnosis not present

## 2015-09-26 DIAGNOSIS — M779 Enthesopathy, unspecified: Secondary | ICD-10-CM

## 2015-09-26 DIAGNOSIS — M778 Other enthesopathies, not elsewhere classified: Secondary | ICD-10-CM

## 2015-09-26 NOTE — Patient Instructions (Signed)
I recommend you discuss your thumb discomfort with your bike shop to see if they can adjust your shifter or handlebars.   Make an annual follow up with dermatology.    You may cut your hydrochlorothiazide in half and continue to record your blood pressures.  If they are elevated over 130 consistently, resumed the full 25mg  tablet daily.

## 2015-09-26 NOTE — Progress Notes (Signed)
Patient ID: Brandon [name], male   DOB: 1946/08/05, 69 y.o.   MRN: [id number]   Location: [facility] Provider: Rexene Edison. Mariea Clonts, D.O., C.M.D.  Code Status: full code Goals of Care: Advanced Directive information Does patient have an advance directive?: Yes  Chief Complaint  Patient presents with  . Medical Management of Chronic Issues    6 month follow-up for Hypertension, Hyperlipidemia  . OTHER    B/P record in chart Patients personal    HPI: Patient is a 69 y.o. male seen in the office today for med mgt of chronic diseases.  He's been bicycling recently outside.  Says spinning is boring.  60 miles per week.  HTN:  BP doing great at home--takes his lisinopril and hctz.  98-133/60s-80s.  Denies side effects from medications.  Has been on same regimen long term.  When he started the hctz, it's been enough to lower his pressure.  Discussed cutting hctz in half and keeping track of it.  If going over 130, then resume.  Hyperlipidemia:  Cholesterol levels are normal now.    Vitamin D deficiency:  Takes supplement.  Has a bump where he had a biopsy done on his right lower neck.  He can't see it.  It stays itchy.  Had it at his last appt and didn't remember to ask me.  Has been bicycling 3-4 months.  Right thumb joint now has a click.  He correlates it with the right thumb shifting and shifting more on the right then left.  Under certain conditions, it hurts.    Review of Systems:  Review of Systems  Constitutional: Negative for fever and chills.  HENT: Negative for congestion.   Eyes: Negative for blurred vision.       Glasses  Respiratory: Negative for cough and shortness of breath.   Cardiovascular: Negative for chest pain.  Gastrointestinal: Negative for abdominal pain.  Genitourinary: Negative for dysuria.  Musculoskeletal: Positive for joint pain. Negative for falls.       Right thumb  Skin: Negative for rash.       Bump on neck where had skin biopsy  Neurological:  Negative for dizziness, loss of consciousness and headaches.  Endo/Heme/Allergies: Does not bruise/bleed easily.  Psychiatric/Behavioral: Negative for memory loss.       Some word-finding difficulty which seems worse    Past Medical History  Diagnosis Date  . Hypertension   . Vitamin D deficiency   . Thrombocytopenia ([place])   . History of colonic polyps   . Hyperlipidemia   . Cervicalgia 03/29/2015    History reviewed. No pertinent past surgical history.  Allergies  Allergen Reactions  . Zocor [Simvastatin] Other (See Comments)    Memory loss  . Niaspan [Niacin Er]       Medication List       This list is accurate as of: 09/26/15  8:36 AM.  Always use your most recent med list.               aspirin EC 81 MG tablet  Take 81 mg by mouth daily.     fenofibrate 160 MG tablet  Take 1 tablet (160 mg total) by mouth daily.     Flax Seed Oil 1000 MG Caps  Take 1 capsule by mouth daily.     hydrochlorothiazide 25 MG tablet  Commonly known as:  HYDRODIURIL  Take 1 tablet (25 mg total) by mouth daily.     lisinopril 40 MG tablet  Commonly known as:  PRINIVIL,ZESTRIL  Take 1 tablet (40 mg total) by mouth daily.     vitamin B-12 100 MCG tablet  Commonly known as:  CYANOCOBALAMIN  Take 100 mcg by mouth daily.     Vitamin D3 2000 UNITS Tabs  Take 1 tablet by mouth daily.        Health Maintenance  Topic Date Due  . Hepatitis C Screening  [birth year]  . PNA vac Low Risk Adult (2 of 2 - PPSV23) 03/20/2016  . INFLUENZA VACCINE  05/29/2016  . TETANUS/TDAP  10/29/2017  . COLONOSCOPY  07/14/2018  . ZOSTAVAX  Completed    Physical Exam: Filed Vitals:   09/26/15 0823  BP: 110/80  Pulse: 67  Temp: 97.4 F (36.3 C)  TempSrc: Oral  Resp: 18  Height: 6\' 2"  (1.88 m)  Weight: 192 lb 6.4 oz (87.272 kg)  SpO2: 98%   Body mass index is 24.69 kg/(m^2). Physical Exam  Constitutional: He is oriented to person, place, and time. He appears well-developed and  well-nourished. No distress.  Cardiovascular: Normal rate, regular rhythm, normal heart sounds and intact distal pulses.   Musculoskeletal:  Notes discomfort with pressing motion of right thumb (like when shifting his bike), some popping at base/MCP joint  Neurological: He is alert and oriented to person, place, and time.  Word-finding difficulty seems more prominent today  Skin:  Right posterior neck with small keloid (pencil-eraser sized) at site of prior skin biopsy  Psychiatric: He has a normal mood and affect.    Labs reviewed: Basic Metabolic Panel:  Recent Labs  03/17/15 0901  NA 143  K 4.0  CL 104  CO2 22  GLUCOSE 81  BUN 22  CREATININE 1.08  CALCIUM 9.1   Liver Function Tests:  Recent Labs  03/17/15 0901  AST 24  ALT 18  ALKPHOS 34*  BILITOT 0.5  PROT 5.7*  ALBUMIN 3.9   No results for input(s): LIPASE, AMYLASE in the last 8760 hours. No results for input(s): AMMONIA in the last 8760 hours. CBC:  Recent Labs  03/17/15 0901  WBC 4.4  NEUTROABS 2.7  HCT 43.1   Lipid Panel:  Recent Labs  03/17/15 0901  CHOL 115  HDL 53  LDLCALC 53  TRIG 44  CHOLHDL 2.2   Assessment/Plan 1. Essential hypertension, benign -bp is well controlled with ace and hctz -cont these and cycling for exercise -may try reducing hctz by 1/2 and continue bp record b/c some bps have been in the 90s - Comprehensive metabolic panel; Future  2. Hyperlipidemia - TG normal with fenofibrate and flaxseed oil - Lipid panel; Future  3. Thrombocytopenia ([place]) - reassess cbc before next appt -no symptoms related to this prior finding - CBC with Differential/Platelet; Future  4. Keloid scar of skin -right posterior neck from biopsy last year -is going to schedule his annual f/u with derm  5. Thumb tendonitis -advised to first try making modifications at the bike shop to accommodate his pain -if these changes are unsuccessful, would consider imaging to assess the arthritis  and also possible OT referral for advice on this  Labs/tests ordered:   Orders Placed This Encounter  Procedures  . CBC with Differential/Platelet    Standing Status: Future     Number of Occurrences:      Standing Expiration Date: 09/25/2016  . Comprehensive metabolic panel    Standing Status: Future     Number of Occurrences:      Standing Expiration Date: 09/25/2016    Order  Specific Question:  Has the patient fasted?    Answer:  Yes  . Lipid panel    Standing Status: Future     Number of Occurrences:      Standing Expiration Date: 09/25/2016    Order Specific Question:  Has the patient fasted?    Answer:  Yes    Next appt:  6 mos for annual exam with labs before, pneumovax  Aeliana Spates L. Reagen Goates, D.O. [facility] [address], [place] [address] Cell Phone (Mon-Fri 8am-5pm):  [phone] On Call:  [phone] & follow prompts after 5pm & weekends Office Phone:  [phone] Office Fax:  [phone]

## 2015-10-27 DIAGNOSIS — D225 Melanocytic nevi of trunk: Secondary | ICD-10-CM | POA: Diagnosis not present

## 2015-10-27 DIAGNOSIS — Z85828 Personal history of other malignant neoplasm of skin: Secondary | ICD-10-CM | POA: Diagnosis not present

## 2015-10-27 DIAGNOSIS — L570 Actinic keratosis: Secondary | ICD-10-CM | POA: Diagnosis not present

## 2015-10-27 DIAGNOSIS — D1801 Hemangioma of skin and subcutaneous tissue: Secondary | ICD-10-CM | POA: Diagnosis not present

## 2015-10-27 DIAGNOSIS — L814 Other melanin hyperpigmentation: Secondary | ICD-10-CM | POA: Diagnosis not present

## 2015-10-27 DIAGNOSIS — D2262 Melanocytic nevi of left upper limb, including shoulder: Secondary | ICD-10-CM | POA: Diagnosis not present

## 2015-10-27 DIAGNOSIS — L821 Other seborrheic keratosis: Secondary | ICD-10-CM | POA: Diagnosis not present

## 2015-11-14 ENCOUNTER — Ambulatory Visit (INDEPENDENT_AMBULATORY_CARE_PROVIDER_SITE_OTHER): Payer: Medicare Other | Admitting: Internal Medicine

## 2015-11-14 ENCOUNTER — Encounter: Payer: Self-pay | Admitting: Internal Medicine

## 2015-11-14 VITALS — BP 122/70 | HR 59 | Temp 97.8°F | Resp 20 | Wt 189.2 lb

## 2015-11-14 DIAGNOSIS — I10 Essential (primary) hypertension: Secondary | ICD-10-CM | POA: Diagnosis not present

## 2015-11-14 DIAGNOSIS — R0781 Pleurodynia: Secondary | ICD-10-CM | POA: Diagnosis not present

## 2015-11-14 DIAGNOSIS — B9789 Other viral agents as the cause of diseases classified elsewhere: Principal | ICD-10-CM

## 2015-11-14 DIAGNOSIS — J069 Acute upper respiratory infection, unspecified: Secondary | ICD-10-CM

## 2015-11-14 NOTE — Patient Instructions (Signed)
Nettipot to flush sinuses  vicks vaporub  Hydration  Warm humidity  Upper Respiratory Infection, Adult Most upper respiratory infections (URIs) are a viral infection of the air passages leading to the lungs. A URI affects the nose, throat, and upper air passages. The most common type of URI is nasopharyngitis and is typically referred to as "the common cold." URIs run their course and usually go away on their own. Most of the time, a URI does not require medical attention, but sometimes a bacterial infection in the upper airways can follow a viral infection. This is called a secondary infection. Sinus and middle ear infections are common types of secondary upper respiratory infections. Bacterial pneumonia can also complicate a URI. A URI can worsen asthma and chronic obstructive pulmonary disease (COPD). Sometimes, these complications can require emergency medical care and may be life threatening.  CAUSES Almost all URIs are caused by viruses. A virus is a type of germ and can spread from one person to another.  RISKS FACTORS You may be at risk for a URI if:   You smoke.   You have chronic heart or lung disease.  You have a weakened defense (immune) system.   You are very young or very old.   You have nasal allergies or asthma.  You work in crowded or poorly ventilated areas.  You work in health care facilities or schools. SIGNS AND SYMPTOMS  Symptoms typically develop 2-3 days after you come in contact with a cold virus. Most viral URIs last 7-10 days. However, viral URIs from the influenza virus (flu virus) can last 14-18 days and are typically more severe. Symptoms may include:   Runny or stuffy (congested) nose.   Sneezing.   Cough.   Sore throat.   Headache.   Fatigue.   Fever.   Loss of appetite.   Pain in your forehead, behind your eyes, and over your cheekbones (sinus pain).  Muscle aches.  DIAGNOSIS  Your health care provider may diagnose a  URI by:  Physical exam.  Tests to check that your symptoms are not due to another condition such as:  Strep throat.  Sinusitis.  Pneumonia.  Asthma. TREATMENT  A URI goes away on its own with time. It cannot be cured with medicines, but medicines may be prescribed or recommended to relieve symptoms. Medicines may help:  Reduce your fever.  Reduce your cough.  Relieve nasal congestion. HOME CARE INSTRUCTIONS   Take medicines only as directed by your health care provider.   Gargle warm saltwater or take cough drops to comfort your throat as directed by your health care provider.  Use a warm mist humidifier or inhale steam from a shower to increase air moisture. This may make it easier to breathe.  Drink enough fluid to keep your urine clear or pale yellow.   Eat soups and other clear broths and maintain good nutrition.   Rest as needed.   Return to work when your temperature has returned to normal or as your health care provider advises. You may need to stay home longer to avoid infecting others. You can also use a face mask and careful hand washing to prevent spread of the virus.  Increase the usage of your inhaler if you have asthma.   Do not use any tobacco products, including cigarettes, chewing tobacco, or electronic cigarettes. If you need help quitting, ask your health care provider. PREVENTION  The best way to protect yourself from getting a cold is to practice  good hygiene.   Avoid oral or hand contact with people with cold symptoms.   Wash your hands often if contact occurs.  There is no clear evidence that vitamin C, vitamin E, echinacea, or exercise reduces the chance of developing a cold. However, it is always recommended to get plenty of rest, exercise, and practice good nutrition.  SEEK MEDICAL CARE IF:   You are getting worse rather than better.   Your symptoms are not controlled by medicine.   You have chills.  You have worsening  shortness of breath.  You have brown or red mucus.  You have yellow or brown nasal discharge.  You have pain in your face, especially when you bend forward.  You have a fever.  You have swollen neck glands.  You have pain while swallowing.  You have white areas in the back of your throat. SEEK IMMEDIATE MEDICAL CARE IF:   You have severe or persistent:  Headache.  Ear pain.  Sinus pain.  Chest pain.  You have chronic lung disease and any of the following:  Wheezing.  Prolonged cough.  Coughing up blood.  A change in your usual mucus.  You have a stiff neck.  You have changes in your:  Vision.  Hearing.  Thinking.  Mood. MAKE SURE YOU:   Understand these instructions.  Will watch your condition.  Will get help right away if you are not doing well or get worse.   This information is not intended to replace advice given to you by your health care provider. Make sure you discuss any questions you have with your health care provider.   Document Released: 04/10/2001 Document Revised: 03/01/2015 Document Reviewed: 01/20/2014 Elsevier Interactive Patient Education [employer].

## 2015-11-14 NOTE — Progress Notes (Signed)
Patient ID: Brandon [name], male   DOB: 02-06-1946, 70 y.o.   MRN: [id number]   Location: [facility] Provider: Rexene Edison. Mariea Clonts, D.O., C.M.D.  Code Status: full code Goals of Care: Advanced Directive information Advanced Directives 11/14/2015  Does patient have an advance directive? Yes  Type of Advance Directive [place]  Does patient want to make changes to advanced directive? No - Patient declined  Copy of advanced directive(s) in chart? Yes    Chief Complaint  Patient presents with  . Acute Visit    cold& cough x 1 1/2 weeks, OTC meds -store brand cough syrup and throat lozenges, phlegm was seen     HPI: Patient is a 70 y.o. male seen in the office today for an acute visit for cough.   No sore throat initially, just felt a little off, dry throat only, then increased cough and productive of sputum.  Got worse after he rode his bike Friday--lots of hacking of thick mucus. Feels pain over his left side.  Had walking pneumonia years before.  No fever.   No chills.   Yesterday and today, dryer.  Still some head congestion present.  No headache.  No ear ache. His wife has not been ill.   Has used otc generic cough syrup, throat lozenges.  Some trouble with sleep at first due to cough, but that's improved also.  Not short of breath on exertion.  Oxygen 98%.    Reduction of hctz led to elevated bps so back up to full tablet.  Did a restoration program two-three wks before with lots of mold and mildew were present there.  Was meant to do this again, but opted not to b/c of coughing.    Review of Systems:  Review of Systems  Constitutional: Negative for fever, chills and malaise/fatigue.  HENT: Positive for congestion. Negative for ear pain and sore throat.        Some ear pressure and popping  Respiratory: Positive for cough and sputum production. Negative for hemoptysis, shortness of breath and wheezing.   Cardiovascular: Positive for chest pain. Negative for  leg swelling.       Left lower ribs with coughing  Gastrointestinal: Negative for nausea, vomiting, abdominal pain and diarrhea.  Musculoskeletal: Negative for falls.  Neurological: Negative for dizziness, weakness and headaches.    Past Medical History  Diagnosis Date  . Hypertension   . Vitamin D deficiency   . Thrombocytopenia ([place])   . History of colonic polyps   . Hyperlipidemia   . Cervicalgia 03/29/2015    No past surgical history on file.  Allergies  Allergen Reactions  . Zocor [Simvastatin] Other (See Comments)    Memory loss  . Niaspan [Niacin Er]       Medication List       This list is accurate as of: 11/14/15 12:36 PM.  Always use your most recent med list.               aspirin EC 81 MG tablet  Take 81 mg by mouth daily.     fenofibrate 160 MG tablet  Take 1 tablet (160 mg total) by mouth daily.     Flax Seed Oil 1000 MG Caps  Take 1 capsule by mouth daily.     hydrochlorothiazide 25 MG tablet  Commonly known as:  HYDRODIURIL  Take 1 tablet (25 mg total) by mouth daily.     lisinopril 40 MG tablet  Commonly known as:  PRINIVIL,ZESTRIL  Take 1 tablet (40 mg total) by mouth daily.     vitamin B-12 100 MCG tablet  Commonly known as:  CYANOCOBALAMIN  Take 100 mcg by mouth daily.     Vitamin D3 2000 units Tabs  Take 1 tablet by mouth daily.        Physical Exam: Filed Vitals:   11/14/15 1212  BP: 122/70  Pulse: 59  Temp: 97.8 F (36.6 C)  TempSrc: Oral  Resp: 20  Weight: 189 lb 3.2 oz (85.821 kg)  SpO2: 98%   Body mass index is 24.28 kg/(m^2). Physical Exam  Constitutional: He is oriented to person, place, and time. He appears well-developed and well-nourished. No distress.  HENT:  Head: Normocephalic and atraumatic.  Right Ear: External ear normal.  Left Ear: External ear normal.  Nose: Nose normal.  Mouth/Throat: Oropharynx is clear and moist.  Mild PND; nasal septal deviation  Eyes: Conjunctivae are normal.  Neck: Neck  supple.  Cardiovascular: Normal rate, regular rhythm and normal heart sounds.   Pulmonary/Chest: Effort normal.  Few rhonchi audible on posterior exam, but cleared with more breaths and cough  Lymphadenopathy:    He has no cervical adenopathy.  Neurological: He is alert and oriented to person, place, and time.  Psychiatric: He has a normal mood and affect.    Labs reviewed: Basic Metabolic Panel:  Recent Labs  03/17/15 0901  NA 143  K 4.0  CL 104  CO2 22  GLUCOSE 81  BUN 22  CREATININE 1.08  CALCIUM 9.1   Liver Function Tests:  Recent Labs  03/17/15 0901  AST 24  ALT 18  ALKPHOS 34*  BILITOT 0.5  PROT 5.7*  ALBUMIN 3.9   No results for input(s): LIPASE, AMYLASE in the last 8760 hours. No results for input(s): AMMONIA in the last 8760 hours. CBC:  Recent Labs  03/17/15 0901  WBC 4.4  NEUTROABS 2.7  HCT 43.1  MCV 89  PLT 133*   Lipid Panel:  Recent Labs  03/17/15 0901  CHOL 115  HDL 53  LDLCALC 53  TRIG 44  CHOLHDL 2.2   Assessment/Plan 1. Viral URI with cough -with some bronchitis from this -is improving gradually -hydrate with water, inhale warm humidity, use nettipot to help with sinus congestion component and decrease postnasal drip -may continue a cough syrup if needed otc -does not appear to need abx or CXR at present  2. Essential hypertension, benign -bp controlled with full hctz so continue this  3. Rib pain on left side -suspect intercostal muscle irritation from coughing, had no diminished breath sounds in that region  -should this and cough and congestion not be improving further by the end of the week, would obtain CXR--advised to call fri am if not better  Labs/tests ordered:  No new Next appt:  04/02/2016  Jobin Montelongo L. Alizee Maple, D.O. [facility] [address], [place] [address] Cell Phone (Mon-Fri 8am-5pm):  [phone] On Call:  [phone] & follow prompts after 5pm &  weekends Office Phone:  [phone] Office Fax:  [phone]

## 2016-02-20 DIAGNOSIS — L814 Other melanin hyperpigmentation: Secondary | ICD-10-CM | POA: Diagnosis not present

## 2016-02-20 DIAGNOSIS — D492 Neoplasm of unspecified behavior of bone, soft tissue, and skin: Secondary | ICD-10-CM | POA: Diagnosis not present

## 2016-02-20 DIAGNOSIS — D485 Neoplasm of uncertain behavior of skin: Secondary | ICD-10-CM | POA: Diagnosis not present

## 2016-02-20 DIAGNOSIS — L570 Actinic keratosis: Secondary | ICD-10-CM | POA: Diagnosis not present

## 2016-02-20 DIAGNOSIS — Z85828 Personal history of other malignant neoplasm of skin: Secondary | ICD-10-CM | POA: Diagnosis not present

## 2016-02-27 HISTORY — PX: ARM SKIN LESION BIOPSY / EXCISION: SUR471

## 2016-03-13 DIAGNOSIS — L821 Other seborrheic keratosis: Secondary | ICD-10-CM | POA: Diagnosis not present

## 2016-03-13 DIAGNOSIS — D485 Neoplasm of uncertain behavior of skin: Secondary | ICD-10-CM | POA: Diagnosis not present

## 2016-03-13 DIAGNOSIS — L089 Local infection of the skin and subcutaneous tissue, unspecified: Secondary | ICD-10-CM | POA: Diagnosis not present

## 2016-03-27 ENCOUNTER — Encounter: Payer: Self-pay | Admitting: Internal Medicine

## 2016-03-28 NOTE — Telephone Encounter (Signed)
I was unable to get this procedure to add to patients history.

## 2016-03-29 ENCOUNTER — Other Ambulatory Visit: Payer: Medicare Other

## 2016-04-02 ENCOUNTER — Ambulatory Visit: Payer: Medicare Other | Admitting: Internal Medicine

## 2016-04-04 ENCOUNTER — Other Ambulatory Visit: Payer: Medicare Other

## 2016-04-04 DIAGNOSIS — I10 Essential (primary) hypertension: Secondary | ICD-10-CM

## 2016-04-04 DIAGNOSIS — E785 Hyperlipidemia, unspecified: Secondary | ICD-10-CM | POA: Diagnosis not present

## 2016-04-04 DIAGNOSIS — D696 Thrombocytopenia, unspecified: Secondary | ICD-10-CM | POA: Diagnosis not present

## 2016-04-05 ENCOUNTER — Encounter: Payer: Self-pay | Admitting: *Deleted

## 2016-04-05 LAB — CBC WITH DIFFERENTIAL/PLATELET
Basophils Absolute: 0 10*3/uL (ref 0.0–0.2)
Basos: 0 %
EOS (ABSOLUTE): 0.1 10*3/uL (ref 0.0–0.4)
Eos: 2 %
Hematocrit: 43.4 % (ref 37.5–51.0)
Hemoglobin: 14.8 g/dL (ref 12.6–17.7)
Immature Grans (Abs): 0 10*3/uL (ref 0.0–0.1)
Immature Granulocytes: 1 %
Lymphocytes Absolute: 1.4 10*3/uL (ref 0.7–3.1)
Lymphs: 23 %
MCH: 29.9 pg (ref 26.6–33.0)
MCHC: 34.1 g/dL (ref 31.5–35.7)
MCV: 88 fL (ref 79–97)
Monocytes Absolute: 0.4 10*3/uL (ref 0.1–0.9)
Monocytes: 6 %
Neutrophils Absolute: 4 10*3/uL (ref 1.4–7.0)
Neutrophils: 68 %
Platelets: 150 10*3/uL (ref 150–379)
RBC: 4.95 x10E6/uL (ref 4.14–5.80)
RDW: 13.6 % (ref 12.3–15.4)
WBC: 5.9 10*3/uL (ref 3.4–10.8)

## 2016-04-05 LAB — COMPREHENSIVE METABOLIC PANEL
ALT: 18 IU/L (ref 0–44)
AST: 22 IU/L (ref 0–40)
Albumin/Globulin Ratio: 2.3 — ABNORMAL HIGH (ref 1.2–2.2)
Albumin: 4.2 g/dL (ref 3.6–4.8)
Alkaline Phosphatase: 41 IU/L (ref 39–117)
BUN/Creatinine Ratio: 15 (ref 10–24)
BUN: 18 mg/dL (ref 8–27)
Bilirubin Total: 0.5 mg/dL (ref 0.0–1.2)
CO2: 27 mmol/L (ref 18–29)
Calcium: 9.4 mg/dL (ref 8.6–10.2)
Chloride: 102 mmol/L (ref 96–106)
Creatinine, Ser: 1.17 mg/dL (ref 0.76–1.27)
GFR calc Af Amer: 74 mL/min/{1.73_m2} (ref >59)
GFR calc non Af Amer: 64 mL/min/{1.73_m2} (ref >59)
Globulin, Total: 1.8 g/dL (ref 1.5–4.5)
Glucose: 80 mg/dL (ref 65–99)
Potassium: 3.9 mmol/L (ref 3.5–5.2)
Sodium: 141 mmol/L (ref 134–144)
Total Protein: 6 g/dL (ref 6.0–8.5)

## 2016-04-05 LAB — LIPID PANEL
Chol/HDL Ratio: 2.8 ratio units (ref 0.0–5.0)
Cholesterol, Total: 147 mg/dL (ref 100–199)
HDL: 52 mg/dL (ref >39)
LDL Calculated: 81 mg/dL (ref 0–99)
Triglycerides: 70 mg/dL (ref 0–149)
VLDL Cholesterol Cal: 14 mg/dL (ref 5–40)

## 2016-04-06 ENCOUNTER — Encounter: Payer: Self-pay | Admitting: Internal Medicine

## 2016-04-06 ENCOUNTER — Ambulatory Visit (INDEPENDENT_AMBULATORY_CARE_PROVIDER_SITE_OTHER): Payer: Medicare Other | Admitting: Internal Medicine

## 2016-04-06 VITALS — BP 120/68 | HR 60 | Temp 98.2°F | Ht 74.0 in | Wt 187.0 lb

## 2016-04-06 DIAGNOSIS — M6588 Other synovitis and tenosynovitis, other site: Secondary | ICD-10-CM | POA: Diagnosis not present

## 2016-04-06 DIAGNOSIS — E785 Hyperlipidemia, unspecified: Secondary | ICD-10-CM | POA: Diagnosis not present

## 2016-04-06 DIAGNOSIS — R4701 Aphasia: Secondary | ICD-10-CM | POA: Diagnosis not present

## 2016-04-06 DIAGNOSIS — Z23 Encounter for immunization: Secondary | ICD-10-CM

## 2016-04-06 DIAGNOSIS — I10 Essential (primary) hypertension: Secondary | ICD-10-CM

## 2016-04-06 DIAGNOSIS — M779 Enthesopathy, unspecified: Secondary | ICD-10-CM

## 2016-04-06 DIAGNOSIS — M778 Other enthesopathies, not elsewhere classified: Secondary | ICD-10-CM | POA: Insufficient documentation

## 2016-04-06 NOTE — Progress Notes (Signed)
Location:  [facility] clinic Provider:  Yarissa Reining L. Mariea Clonts, D.O., C.M.D.  Code Status: full code for now as discussed at prior appt Goals of Care:  Advanced Directives 04/06/2016  Does patient have an advance directive? Yes  Type of Advance Directive [place]  Does patient want to make changes to advanced directive? -  Copy of advanced directive(s) in chart? Yes     Chief Complaint  Patient presents with  . Medical Management of Chronic Issues    6 mth follow-up    HPI: Patient is a 70 y.o. male seen today for medical management of chronic diseases.  He is doing great.    1.  Memory concerns.  He is almost 74.  Wants to know when he forgets things and doesn't remember, Pleas Koch thinks he is off to the dementia ward. Sometimes takes a few mins, sometimes a few hrs, but does come back.  2.  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  Dr. Harriett Sine.    Continues to cycle for exercise.  Was advised to use baby sunscreen with zinc by derm.  Thumb problem resolved after he got a new touring bicycle.  Was so bad he could hardly tie his shoe.  Still a little stiff, but pretty much normal now.  Was aggravated and resolved.    BP here was fabulous.  Has taken it some at home, but not consistently.  Could not do w/o hctz in addition to his lisinopril.    Asks about PSA testing.  Only gets up once.  No difficulty starting stream.    Past Medical History  Diagnosis Date  . Hypertension   . Vitamin D deficiency   . Thrombocytopenia ([place])   . History of colonic polyps   . Hyperlipidemia   . Cervicalgia 03/29/2015    Past Surgical History  Procedure Laterality Date  . Arm skin lesion biopsy / excision Left 02/2016    atypical lentigmous and nested melanocytic proliferation--Dr. Harriett Sine    Allergies  Allergen Reactions  . Zocor [Simvastatin] Other (See  Comments)    Memory loss  . Niaspan [Niacin Er]       Medication List       This list is accurate as of: 04/06/16  8:30 AM.  Always use your most recent med list.               aspirin EC 81 MG tablet  Take 81 mg by mouth daily.     fenofibrate 160 MG tablet  Take 1 tablet (160 mg total) by mouth daily.     Flax Seed Oil 1000 MG Caps  Take 1 capsule by mouth daily.     hydrochlorothiazide 25 MG tablet  Commonly known as:  HYDRODIURIL  Take 1 tablet (25 mg total) by mouth daily.     lisinopril 40 MG tablet  Commonly known as:  PRINIVIL,ZESTRIL  Take 1 tablet (40 mg total) by mouth daily.     vitamin B-12 100 MCG tablet  Commonly known as:  CYANOCOBALAMIN  Take 100 mcg by mouth daily.     Vitamin D3 2000 units Tabs  Take 1 tablet by mouth daily.        Review of Systems:  Review of Systems  Constitutional: Negative for fever, chills and malaise/fatigue.  HENT: Negative for congestion and hearing loss.   Eyes: Negative for blurred vision.  Glasses  Respiratory: Negative for cough and shortness of breath.   Cardiovascular: Negative for chest pain, palpitations and leg swelling.  Gastrointestinal: Negative for abdominal pain, constipation, blood in stool and melena.  Genitourinary: Negative for dysuria, urgency, frequency and hematuria.  Musculoskeletal: Negative for myalgias, joint pain and falls.       Right thumb better  Skin: Negative for itching and rash.       S/p excision of premalignant lesion on left forearm  Neurological: Negative for dizziness, loss of consciousness, weakness and headaches.  Psychiatric/Behavioral: Positive for memory loss. Negative for depression.       He reports word-finding and some is evident on exam, but he does eventually get words    Health Maintenance  Topic Date Due  . Hepatitis C Screening  10/26/1946  . PNA vac Low Risk Adult (2 of 2 - PPSV23) 03/20/2016  . INFLUENZA VACCINE  05/29/2016  . TETANUS/TDAP  10/29/2017   . COLONOSCOPY  07/14/2018  . ZOSTAVAX  Completed    Physical Exam: Filed Vitals:   04/06/16 0813  BP: 120/68  Pulse: 60  Temp: 98.2 F (36.8 C)  TempSrc: Oral  Height: 6\' 2"  (1.88 m)  Weight: 187 lb (84.823 kg)  SpO2: 96%   Body mass index is 24 kg/(m^2). Physical Exam  Constitutional: He is oriented to person, place, and time. He appears well-developed and well-nourished. No distress.  Eyes:  glasses  Cardiovascular: Normal rate, regular rhythm, normal heart sounds and intact distal pulses.   Pulmonary/Chest: Effort normal and breath sounds normal. No respiratory distress.  Abdominal: Soft. Bowel sounds are normal. He exhibits no distension. There is no tenderness.  Musculoskeletal: Normal range of motion.  Neurological: He is alert and oriented to person, place, and time.  Skin: Skin is warm and dry.  Left forearm with healing incision site from biopsy  Psychiatric: He has a normal mood and affect.    Labs reviewed: Basic Metabolic Panel:  Recent Labs  04/04/16 0910  NA 141  K 3.9  CL 102  CO2 27  GLUCOSE 80  BUN 18  CREATININE 1.17  CALCIUM 9.4   Liver Function Tests:  Recent Labs  04/04/16 0910  AST 22  ALT 18  ALKPHOS 41  BILITOT 0.5  PROT 6.0  ALBUMIN 4.2   No results for input(s): LIPASE, AMYLASE in the last 8760 hours. No results for input(s): AMMONIA in the last 8760 hours. CBC:  Recent Labs  04/04/16 0910  WBC 5.9  NEUTROABS 4.0  HCT 43.4  MCV 88  PLT 150   Lipid Panel:  Recent Labs  04/04/16 0910  CHOL 147  HDL 52  LDLCALC 81  TRIG 70  CHOLHDL 2.8   Assessment/Plan 1. Essential hypertension -bp at goal with current therapy so cont same plus diet and exercise  2. Hyperlipidemia - cont diet and exercise--did trend up this time - Lipid panel; Future - CBC with Differential/Platelet; Future  3. Thumb tendonitis -resolved  4. 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 Need for prophylactic vaccination against Streptococcus pneumoniae (pneumococcus) - Pneumococcal polysaccharide vaccine 23-valent greater than or equal to 2yo subcutaneous/IM--pneumovax given today so up to date now  Labs/tests ordered:   Orders Placed This Encounter  Procedures  . Pneumococcal polysaccharide vaccine 23-valent greater than or equal to 2yo subcutaneous/IM  . Lipid panel    Standing Status: Future     Number of Occurrences:      Standing Expiration Date: 04/06/2017    Order Specific Question:  Has the patient fasted?    Answer:  Yes  . CBC with Differential/Platelet    Standing Status: Future     Number of Occurrences:      Standing Expiration Date: 04/06/2017    Next appt:  6 mos for annual with labs before  [place]. Cortez Steelman, D.O. [facility] [address], [place] [address] Cell Phone (Mon-Fri 8am-5pm):  [phone] On Call:  [phone] & follow prompts after 5pm & weekends Office Phone:  [phone] Office Fax:  [phone]

## 2016-04-08 ENCOUNTER — Other Ambulatory Visit: Payer: Self-pay | Admitting: Internal Medicine

## 2016-04-18 DIAGNOSIS — Z0100 Encounter for examination of eyes and vision without abnormal findings: Secondary | ICD-10-CM | POA: Diagnosis not present

## 2016-04-18 DIAGNOSIS — D3131 Benign neoplasm of right choroid: Secondary | ICD-10-CM | POA: Diagnosis not present

## 2016-04-18 DIAGNOSIS — H401411 Capsular glaucoma with pseudoexfoliation of lens, right eye, mild stage: Secondary | ICD-10-CM | POA: Diagnosis not present

## 2016-04-18 DIAGNOSIS — H1789 Other corneal scars and opacities: Secondary | ICD-10-CM | POA: Diagnosis not present

## 2016-05-28 ENCOUNTER — Other Ambulatory Visit: Payer: Self-pay | Admitting: Internal Medicine

## 2016-08-03 ENCOUNTER — Ambulatory Visit (INDEPENDENT_AMBULATORY_CARE_PROVIDER_SITE_OTHER): Payer: Medicare Other

## 2016-08-03 DIAGNOSIS — Z23 Encounter for immunization: Secondary | ICD-10-CM | POA: Diagnosis not present

## 2016-10-08 ENCOUNTER — Other Ambulatory Visit: Payer: Medicare Other

## 2016-10-12 ENCOUNTER — Encounter: Payer: Medicare Other | Admitting: Internal Medicine

## 2016-10-15 DIAGNOSIS — Z0100 Encounter for examination of eyes and vision without abnormal findings: Secondary | ICD-10-CM | POA: Diagnosis not present

## 2016-10-15 DIAGNOSIS — H401411 Capsular glaucoma with pseudoexfoliation of lens, right eye, mild stage: Secondary | ICD-10-CM | POA: Diagnosis not present

## 2016-10-23 ENCOUNTER — Other Ambulatory Visit: Payer: Medicare Other

## 2016-10-23 ENCOUNTER — Other Ambulatory Visit: Payer: Self-pay

## 2016-10-23 DIAGNOSIS — E785 Hyperlipidemia, unspecified: Secondary | ICD-10-CM | POA: Diagnosis not present

## 2016-10-23 LAB — LIPID PANEL
Cholesterol: 118 mg/dL (ref <200)
HDL: 45 mg/dL (ref >40)
LDL Cholesterol: 64 mg/dL (ref <100)
Total CHOL/HDL Ratio: 2.6 Ratio (ref <5.0)
Triglycerides: 46 mg/dL (ref <150)
VLDL: 9 mg/dL (ref <30)

## 2016-10-23 LAB — CBC WITH DIFFERENTIAL/PLATELET
Basophils Absolute: 0 cells/uL (ref 0–200)
Basophils Relative: 0 %
Eosinophils Absolute: 150 cells/uL (ref 15–500)
Eosinophils Relative: 3 %
HCT: 44.6 % (ref 38.5–50.0)
Hemoglobin: 15.2 g/dL (ref 13.2–17.1)
Lymphocytes Relative: 19 %
Lymphs Abs: 950 cells/uL (ref 850–3900)
MCH: 31 pg (ref 27.0–33.0)
MCHC: 34.1 g/dL (ref 32.0–36.0)
MCV: 90.8 fL (ref 80.0–100.0)
MPV: 10.8 fL (ref 7.5–12.5)
Monocytes Absolute: 400 cells/uL (ref 200–950)
Monocytes Relative: 8 %
Neutro Abs: 3500 cells/uL (ref 1500–7800)
Neutrophils Relative %: 70 %
Platelets: 184 10*3/uL (ref 140–400)
RBC: 4.91 MIL/uL (ref 4.20–5.80)
RDW: 12.8 % (ref 11.0–15.0)
WBC: 5 10*3/uL (ref 3.8–10.8)

## 2016-10-25 ENCOUNTER — Ambulatory Visit (INDEPENDENT_AMBULATORY_CARE_PROVIDER_SITE_OTHER): Payer: Medicare Other | Admitting: Internal Medicine

## 2016-10-25 ENCOUNTER — Encounter: Payer: Self-pay | Admitting: Internal Medicine

## 2016-10-25 ENCOUNTER — Ambulatory Visit (INDEPENDENT_AMBULATORY_CARE_PROVIDER_SITE_OTHER): Payer: Medicare Other

## 2016-10-25 VITALS — BP 122/80 | HR 57 | Temp 97.7°F | Ht 73.5 in | Wt 188.6 lb

## 2016-10-25 VITALS — BP 122/80 | HR 57 | Temp 97.7°F | Ht 73.5 in | Wt 188.0 lb

## 2016-10-25 DIAGNOSIS — D0362 Melanoma in situ of left upper limb, including shoulder: Secondary | ICD-10-CM | POA: Diagnosis not present

## 2016-10-25 DIAGNOSIS — I10 Essential (primary) hypertension: Secondary | ICD-10-CM | POA: Diagnosis not present

## 2016-10-25 DIAGNOSIS — M779 Enthesopathy, unspecified: Secondary | ICD-10-CM

## 2016-10-25 DIAGNOSIS — Z1159 Encounter for screening for other viral diseases: Secondary | ICD-10-CM

## 2016-10-25 DIAGNOSIS — Z0000 Encounter for general adult medical examination without abnormal findings: Secondary | ICD-10-CM | POA: Diagnosis not present

## 2016-10-25 DIAGNOSIS — D696 Thrombocytopenia, unspecified: Secondary | ICD-10-CM

## 2016-10-25 DIAGNOSIS — L814 Other melanin hyperpigmentation: Secondary | ICD-10-CM | POA: Diagnosis not present

## 2016-10-25 DIAGNOSIS — D225 Melanocytic nevi of trunk: Secondary | ICD-10-CM | POA: Diagnosis not present

## 2016-10-25 DIAGNOSIS — E785 Hyperlipidemia, unspecified: Secondary | ICD-10-CM

## 2016-10-25 DIAGNOSIS — Z85828 Personal history of other malignant neoplasm of skin: Secondary | ICD-10-CM | POA: Diagnosis not present

## 2016-10-25 DIAGNOSIS — D2261 Melanocytic nevi of right upper limb, including shoulder: Secondary | ICD-10-CM | POA: Diagnosis not present

## 2016-10-25 DIAGNOSIS — M778 Other enthesopathies, not elsewhere classified: Secondary | ICD-10-CM | POA: Diagnosis not present

## 2016-10-25 DIAGNOSIS — D2262 Melanocytic nevi of left upper limb, including shoulder: Secondary | ICD-10-CM | POA: Diagnosis not present

## 2016-10-25 DIAGNOSIS — M542 Cervicalgia: Secondary | ICD-10-CM

## 2016-10-25 DIAGNOSIS — L821 Other seborrheic keratosis: Secondary | ICD-10-CM | POA: Diagnosis not present

## 2016-10-25 DIAGNOSIS — L570 Actinic keratosis: Secondary | ICD-10-CM | POA: Diagnosis not present

## 2016-10-25 DIAGNOSIS — D1801 Hemangioma of skin and subcutaneous tissue: Secondary | ICD-10-CM | POA: Diagnosis not present

## 2016-10-25 NOTE — Progress Notes (Signed)
Subjective:   Brandon [name] is a 70 y.o. male who presents for Medicare Annual/Subsequent preventive examination.  Review of Systems:  Cardiac Risk Factors include: advanced age (>35men, >68 women);family history of premature cardiovascular disease;hypertension;male gender     Objective:    Vitals: BP 122/80 (BP Location: Left Arm, Patient Position: Sitting, Cuff Size: Normal)   Pulse (!) 57   Temp 97.7 F (36.5 C) (Oral)   Ht 6' 1.5" (1.867 m)   Wt 188 lb 9.6 oz (85.5 kg)   SpO2 99%   BMI 24.55 kg/m   Body mass index is 24.55 kg/m.  Tobacco History  Smoking Status  . Never Smoker  Smokeless Tobacco  . Never Used     Counseling given: No   Past Medical History:  Diagnosis Date  . Cervicalgia 03/29/2015  . History of colonic polyps   . Hyperlipidemia   . Hypertension   . Thrombocytopenia ([place])   . Vitamin D deficiency    Past Surgical History:  Procedure Laterality Date  . ARM SKIN LESION BIOPSY / EXCISION Left 02/2016   atypical lentigmous and nested melanocytic proliferation--Dr. Harriett Sine   Family History  Problem Relation Age of Onset  . Clotting disorder Father   . Suicidality Father   . High Cholesterol Mother   . Renal Disease Mother   . Stroke Mother 72   History  Sexual Activity  . Sexual activity: Not Currently    Outpatient Encounter Prescriptions as of 10/25/2016  Medication Sig  . aspirin EC 81 MG tablet Take 81 mg by mouth daily.  . fenofibrate 160 MG tablet Take 1 tablet by mouth  daily  . hydrochlorothiazide (HYDRODIURIL) 25 MG tablet Take 1 tablet by mouth  daily  . lisinopril (PRINIVIL,ZESTRIL) 40 MG tablet Take 1 tablet by mouth  daily  . [DISCONTINUED] Cholecalciferol (VITAMIN D3) 2000 UNITS TABS Take 1 tablet by mouth daily.   . [DISCONTINUED] Flaxseed, Linseed, (FLAX SEED OIL) 1000 MG CAPS Take 1 capsule by mouth daily.   . [DISCONTINUED] vitamin B-12 (CYANOCOBALAMIN) 100 MCG tablet Take 100 mcg by mouth daily.   No  facility-administered encounter medications on file as of 10/25/2016.     Activities of Daily Living In your present state of health, do you have any difficulty performing the following activities: 10/25/2016  Hearing? N  Vision? Y  Difficulty concentrating or making decisions? Y  Walking or climbing stairs? N  Dressing or bathing? N  Doing errands, shopping? N  Preparing Food and eating ? N  Using the Toilet? N  In the past six months, have you accidently leaked urine? N  Do you have problems with loss of bowel control? N  Managing your Medications? N  Managing your Finances? N  Housekeeping or managing your Housekeeping? N  Some recent data might be hidden    Patient Care Team: Gayland Curry, DO as PCP - General (Geriatric Medicine)   Assessment:     Exercise Activities and Dietary recommendations Current Exercise Habits: Home exercise routine, Type of exercise: Other - see comments (Pt does 50 miles of bicycling per week ), Time (Minutes): 60, Frequency (Times/Week): 4, Weekly Exercise (Minutes/Week): 240, Intensity: Moderate  Goals    . <enter goal here>          Starting 10/25/16, I will maintain my current exercise lifestyle of biking 50 miles per week.       Fall Risk Fall Risk  10/25/2016 04/06/2016 11/14/2015 09/26/2015 03/29/2015  Falls in the  past year? Yes No No No No  Number falls in past yr: 1 - - - -  Injury with Fall? Yes - - - -  Follow up Education provided - - - -   Depression Screen PHQ 2/9 Scores 10/25/2016 04/06/2016 12/17/2014  PHQ - 2 Score 1 0 0    Cognitive Function MMSE - Mini Mental State Exam 10/25/2016 03/21/2015  Orientation to time 5 5  Orientation to Place 5 5  Registration 3 3  Attention/ Calculation 5 5  Recall 3 3  Language- name 2 objects 2 2  Language- repeat 1 1  Language- follow 3 step command 2 3  Language- read & follow direction 1 1  Write a sentence 1 1  Copy design 1 1  Total score 29 30        Immunization History   Administered Date(s) Administered  . Influenza,inj,Quad PF,36+ Mos 07/25/2015, 08/03/2016  . Pneumococcal Conjugate-13 03/21/2015  . Pneumococcal Polysaccharide-23 04/06/2016  . Tdap 10/30/2007  . Zoster 10/29/2012   Screening Tests Health Maintenance  Topic Date Due  . Hepatitis C Screening  02/25/1946  . TETANUS/TDAP  10/29/2017  . COLONOSCOPY  07/14/2018  . INFLUENZA VACCINE  Completed  . ZOSTAVAX  Completed  . PNA vac Low Risk Adult  Completed      Plan:    I have personally reviewed and addressed the Medicare Annual Wellness questionnaire and have noted the following in the patient's chart:  A. Medical and social history B. Use of alcohol, tobacco or illicit drugs  C. Current medications and supplements D. Functional ability and status E.  Nutritional status F.  Physical activity G. Advance directives H. List of other physicians I.  Hospitalizations, surgeries, and ER visits in previous 12 months J.  [place] to include hearing, vision, cognitive, depression L. Referrals and appointments - none  In addition, I have reviewed and discussed with patient certain preventive protocols, quality metrics, and best practice recommendations. A written personalized care plan for preventive services as well as general preventive health recommendations were provided to patient.  See attached scanned questionnaire for additional information.   Signed,   Allyn Kenner, LPN Health Advisor    I reviewed health advisor's note, was available for consultation and agree with the assessment and plan as written.    Abdirizak Richison L. Lillis Nuttle, D.O. [facility] [address], [place] [address] Cell Phone (Mon-Fri 8am-5pm):  [phone] On Call:  [phone] & follow prompts after 5pm & weekends Office Phone:  [phone] Office Fax:  [phone]

## 2016-10-25 NOTE — Progress Notes (Signed)
Quick Notes   Health Maintenance:  Pt is up to date on health maintenance.    Abnormal Screen: None; MMSE-29/30 Passed Clock Test     Patient Concerns:  Pt would like to take 2 month cross country bike ride and he would like to discuss this with you.     Nurse Concerns:  None

## 2016-10-25 NOTE — Patient Instructions (Addendum)
Brandon [name] , Thank you for taking time to come for your Medicare Wellness Visit. I appreciate your ongoing commitment to your health goals. Please review the following plan we discussed and let me know if I can assist you in the future.   These are the goals we discussed: Goals    . <enter goal here>          Starting 10/25/16, I will maintain my current exercise lifestyle of biking 50 miles per week.        This is a list of the screening recommended for you and due dates:  Health Maintenance  Topic Date Due  .  Hepatitis C: One time screening is recommended by Center for Disease Control  (CDC) for  adults born from [birth year] through 1964.   [birth year]  . Tetanus Vaccine  10/29/2017  . Colon Cancer Screening  07/14/2018  . Flu Shot  Completed  . Shingles Vaccine  Completed  . Pneumonia vaccines  Completed  Preventive Care for Adults  A healthy lifestyle and preventive care can promote health and wellness. Preventive health guidelines for adults include the following key practices.  . A routine yearly physical is a good way to check with your health care provider about your health and preventive screening. It is a chance to share any concerns and updates on your health and to receive a thorough exam.  . Visit your dentist for a routine exam and preventive care every 6 months. Brush your teeth twice a day and floss once a day. Good oral hygiene prevents tooth decay and gum disease.  . The frequency of eye exams is based on your age, health, family medical history, use  of contact lenses, and other factors. Follow your health care provider's ecommendations for frequency of eye exams.  . Eat a healthy diet. Foods like vegetables, fruits, whole grains, low-fat dairy products, and lean protein foods contain the nutrients you need without too many calories. Decrease your intake of foods high in solid fats, added sugars, and salt. Eat the right amount of calories for you. Get information about a  proper diet from your health care provider, if necessary.  . Regular physical exercise is one of the most important things you can do for your health. Most adults should get at least 150 minutes of moderate-intensity exercise (any activity that increases your heart rate and causes you to sweat) each week. In addition, most adults need muscle-strengthening exercises on 2 or more days a week.  Silver Sneakers may be a benefit available to you. To determine eligibility, you may visit the website: www.silversneakers.com or contact program at 415-758-6728 Mon-Fri between 8AM-8PM.   . Maintain a healthy weight. The body mass index (BMI) is a screening tool to identify possible weight problems. It provides an estimate of body fat based on height and weight. Your health care provider can find your BMI and can help you achieve or maintain a healthy weight.   For adults 20 years and older: ? A BMI below 18.5 is considered underweight. ? A BMI of 18.5 to 24.9 is normal. ? A BMI of 25 to 29.9 is considered overweight. ? A BMI of 30 and above is considered obese.   . Maintain normal blood lipids and cholesterol levels by exercising and minimizing your intake of saturated fat. Eat a balanced diet with plenty of fruit and vegetables. Blood tests for lipids and cholesterol should begin at age 57 and be repeated every 5 years. If your lipid  or cholesterol levels are high, you are over 50, or you are at high risk for heart disease, you may need your cholesterol levels checked more frequently. Ongoing high lipid and cholesterol levels should be treated with medicines if diet and exercise are not working.  . If you smoke, find out from your health care provider how to quit. If you do not use tobacco, please do not start.  . If you choose to drink alcohol, please do not consume more than 2 drinks per day. One drink is considered to be 12 ounces (355 mL) of beer, 5 ounces (148 mL) of wine, or 1.5 ounces (44 mL) of  liquor.  . If you are 40-62 years old, ask your health care provider if you should take aspirin to prevent strokes.  . Use sunscreen. Apply sunscreen liberally and repeatedly throughout the day. You should seek shade when your shadow is shorter than you. Protect yourself by wearing long sleeves, pants, a wide-brimmed hat, and sunglasses year round, whenever you are outdoors.  . Once a month, do a whole body skin exam, using a mirror to look at the skin on your back. Tell your health care provider of new moles, moles that have irregular borders, moles that are larger than a pencil eraser, or moles that have changed in shape or color.

## 2016-10-25 NOTE — Progress Notes (Signed)
Location:  [facility] clinic Provider:  Kadi Hession L. Mariea Clonts, D.O., C.M.D.  Code Status: full code Goals of Care:  Advanced Directives 10/25/2016  Does Patient Have a Medical Advance Directive? Yes  Type of Advance Directive [place]  Does patient want to make changes to medical advance directive? -  Copy of [place] in Chart? Yes   Chief Complaint  Patient presents with  . Extended Visit    HPI: Patient is a 70 y.o. male seen today for medical management of chronic diseases.  He has medicare so this is an extended visit.  He had his AWV with LPN, Alisa.    He is up to date except for hepatitis C screening.    He has a list of items: Age related vs. Bicycling--wants to take a 2 month bicycle trip.  He's been reading some blogs.  He is currently averaging 50 miles per week.  [place] a [place] in [place] and then ride over to [place] and come back.  Goal to be home in July for his bday.    Wonders about his labwork.  LDL has improved from last time to a normal (had been trending upward).  His increased cycling has helped. He bikes to eat.  Platelets have normalized.    He had a day he woke up with pain in his neck.  Had some difficulty with cervicalgia in May of last year.    When he saw ophtho, he was told he needed cataract surgery.  Meets again with Dr. Prudencio Burly soon to arrange this.    Says he has days where his body is falling apart.    Stopped his vitamins and wonders if he should have.  Admits his difficulty finding words might be worse.  Gets anxious when he is trying to find names.  Then comes up easily 5 mins later.    Scores 29/30 on mmse.    He had one episode of feeling off balance when he went outside to pick up the paper.  He was walking down 4 steps and he missed a step and he fell forward to the ground and struck his head.  He is much more careful and deliberate about the steps.    Past Medical  History:  Diagnosis Date  . Cervicalgia 03/29/2015  . History of colonic polyps   . Hyperlipidemia   . Hypertension   . Thrombocytopenia ([place])   . Vitamin D deficiency     Past Surgical History:  Procedure Laterality Date  . ARM SKIN LESION BIOPSY / EXCISION Left 02/2016   atypical lentigmous and nested melanocytic proliferation--Dr. Harriett Sine    Allergies  Allergen Reactions  . Zocor [Simvastatin] Other (See Comments)    Memory loss  . Niaspan [Niacin Er]     Allergies as of 10/25/2016      Reactions   Zocor [simvastatin] Other (See Comments)   Memory loss   Niaspan [niacin Er]       Medication List       Accurate as of 10/25/16  9:34 AM. Always use your most recent med list.          aspirin EC 81 MG tablet Take 81 mg by mouth daily.   fenofibrate 160 MG tablet Take 1 tablet by mouth  daily   hydrochlorothiazide 25 MG tablet Commonly known as:  HYDRODIURIL Take 1 tablet by mouth  daily   lisinopril 40 MG  tablet Commonly known as:  PRINIVIL,ZESTRIL Take 1 tablet by mouth  daily       Review of Systems:  Review of Systems  Constitutional: Negative for chills, fever and malaise/fatigue.  HENT: Negative for congestion, ear pain, hearing loss and sore throat.   Eyes: Negative for blurred vision.       Glasses, cataract right eye in need of surgery  Respiratory: Negative for cough and shortness of breath.   Cardiovascular: Negative for chest pain and palpitations.  Gastrointestinal: Negative for abdominal pain, blood in stool, constipation, diarrhea and melena.  Genitourinary: Negative for dysuria.  Musculoskeletal: Positive for falls and neck pain. Negative for back pain, joint pain and myalgias.  Skin: Negative for itching and rash.  Neurological: Negative for dizziness, loss of consciousness and weakness.  Endo/Heme/Allergies: Does not bruise/bleed easily.  Psychiatric/Behavioral: Negative for depression and memory loss. The patient does not  have insomnia.     Health Maintenance  Topic Date Due  . Hepatitis C Screening  08-08-1946  . TETANUS/TDAP  10/29/2017  . COLONOSCOPY  07/14/2018  . INFLUENZA VACCINE  Completed  . ZOSTAVAX  Completed  . PNA vac Low Risk Adult  Completed    Physical Exam: Vitals:   10/25/16 0901  BP: 122/80  Pulse: (!) 57  Temp: 97.7 F (36.5 C)  TempSrc: Oral  SpO2: 99%  Weight: 188 lb (85.3 kg)  Height: 6' 1.5" (1.867 m)   Body mass index is 24.47 kg/m. Physical Exam  Constitutional: He is oriented to person, place, and time. He appears well-developed and well-nourished. No distress.  HENT:  Head: Normocephalic and atraumatic.  Right Ear: External ear normal.  Left Ear: External ear normal.  Nose: Nose normal.  Mouth/Throat: Oropharynx is clear and moist. No oropharyngeal exudate.  Eyes: Conjunctivae and EOM are normal. Pupils are equal, round, and reactive to light.  glasses  Neck: Normal range of motion. Neck supple.  Cardiovascular: Normal rate, regular rhythm, normal heart sounds and intact distal pulses.   Pulmonary/Chest: Effort normal and breath sounds normal. No respiratory distress.  Abdominal: Soft. Bowel sounds are normal. He exhibits no distension and no mass. There is no tenderness. There is no rebound and no guarding.  Musculoskeletal: Normal range of motion. He exhibits no edema, tenderness or deformity.  Neurological: He is alert and oriented to person, place, and time. No cranial nerve deficit.  Patellar DTRs 1+; slow to find words  Skin: Skin is warm and dry. Capillary refill takes less than 2 seconds.  Psychiatric: He has a normal mood and affect. His behavior is normal. Judgment and thought content normal.    Labs reviewed: Basic Metabolic Panel:  Recent Labs  04/04/16 0910  NA 141  K 3.9  CL 102  CO2 27  GLUCOSE 80  BUN 18  CREATININE 1.17  CALCIUM 9.4   Liver Function Tests:  Recent Labs  04/04/16 0910  AST 22  ALT 18  ALKPHOS 41  BILITOT  0.5  PROT 6.0  ALBUMIN 4.2   No results for input(s): LIPASE, AMYLASE in the last 8760 hours. No results for input(s): AMMONIA in the last 8760 hours. CBC:  Recent Labs  04/04/16 0910 10/23/16 0807  WBC 5.9 5.0  NEUTROABS 4.0 3,500  HGB  --  15.2  HCT 43.4 44.6  MCV 88 90.8  PLT 150 184   Lipid Panel:  Recent Labs  04/04/16 0910 10/23/16 0807  CHOL 147 118  HDL 52 45  LDLCALC 81 64  TRIG 70 46  CHOLHDL 2.8 2.6   Assessment/Plan 1. Essential hypertension - bp well controlled, cont current routine with cycling, hctz, lisinopril - COMPLETE METABOLIC PANEL WITH GFR; Future  2. Thrombocytopenia (HCC) - platelets normalized the past two checks, monitor - CBC with Differential/Platelet; Future  3. Cervicalgia -has a spell occasionally--latest after falling asleep with his head down  4. Hyperlipidemia, unspecified hyperlipidemia type - lipids at goal; cont fenofibrate and exercise routine - Lipid panel; Future  5. Thumb tendonitis -resolved with change of braking mechanism on his bike  6. Encounter for hepatitis C screening test for low risk patient - Hep C Antibody; Future  Labs/tests ordered:   Orders Placed This Encounter  Procedures  . COMPLETE METABOLIC PANEL WITH GFR    [facility]    Standing Status:   Future    Standing Expiration Date:   10/25/2017  . CBC with Differential/Platelet    Standing Status:   Future    Standing Expiration Date:   10/25/2017  . Lipid panel    Standing Status:   Future    Standing Expiration Date:   10/25/2017    Order Specific Question:   Has the patient fasted?    Answer:   Yes  . Hep C Antibody    Standing Status:   Future    Standing Expiration Date:   10/25/2017   Next appt:  ~6 mos med mgt, labs before  Erielle Gawronski L. Treanna Dumler, D.O. [facility] [address], [place] [address] Cell Phone (Mon-Fri 8am-5pm):  [phone] On Call:  [phone] & follow prompts  after 5pm & weekends Office Phone:  [phone] Office Fax:  [phone]

## 2016-11-07 DIAGNOSIS — H2513 Age-related nuclear cataract, bilateral: Secondary | ICD-10-CM | POA: Diagnosis not present

## 2016-11-12 ENCOUNTER — Other Ambulatory Visit: Payer: Self-pay | Admitting: Internal Medicine

## 2016-11-16 DIAGNOSIS — D0362 Melanoma in situ of left upper limb, including shoulder: Secondary | ICD-10-CM | POA: Diagnosis not present

## 2016-11-16 DIAGNOSIS — L988 Other specified disorders of the skin and subcutaneous tissue: Secondary | ICD-10-CM | POA: Diagnosis not present

## 2016-12-04 DIAGNOSIS — H2511 Age-related nuclear cataract, right eye: Secondary | ICD-10-CM | POA: Diagnosis not present

## 2016-12-04 DIAGNOSIS — H25811 Combined forms of age-related cataract, right eye: Secondary | ICD-10-CM | POA: Diagnosis not present

## 2017-01-01 DIAGNOSIS — H2512 Age-related nuclear cataract, left eye: Secondary | ICD-10-CM | POA: Diagnosis not present

## 2017-01-01 DIAGNOSIS — H25812 Combined forms of age-related cataract, left eye: Secondary | ICD-10-CM | POA: Diagnosis not present

## 2017-01-29 ENCOUNTER — Encounter: Payer: Self-pay | Admitting: Nurse Practitioner

## 2017-01-29 ENCOUNTER — Ambulatory Visit (INDEPENDENT_AMBULATORY_CARE_PROVIDER_SITE_OTHER): Payer: Medicare Other | Admitting: Nurse Practitioner

## 2017-01-29 VITALS — BP 122/80 | HR 74 | Temp 97.6°F | Resp 18 | Ht 73.0 in | Wt 191.6 lb

## 2017-01-29 DIAGNOSIS — R05 Cough: Secondary | ICD-10-CM

## 2017-01-29 DIAGNOSIS — R059 Cough, unspecified: Secondary | ICD-10-CM

## 2017-01-29 NOTE — Progress Notes (Signed)
Careteam: Patient Care Team: Gayland Curry, DO as PCP - General (Geriatric Medicine)  Advanced Directive information Does Patient Have a Medical Advance Directive?: Yes, Type of Advance Directive: Healthcare Power of [place];Living will  Allergies  Allergen Reactions  . Zocor [Simvastatin] Other (See Comments)    Memory loss  . Niaspan [Niacin Er]     Chief Complaint  Patient presents with  . Acute Visit    Pt is being seen due to cough x 3 weeks.      HPI: Patient is a 71 y.o. male seen in the office today due to cough. Pt reports he had sore throat, head congestion with occasional cough. Now he has a "low grade cough" tickle in his throat. Nonproductive. Not all the time just a pain. Has a bicycle trip a month from now and wants it gone.  Went on a bicycle ride and it was not bothersome.  No shortness of breath.  No fever.  Worried about walking pneumonia-- had this about 15 years ago.  Brother has hay fever, could be this also.  Review of Systems:  Review of Systems  Constitutional: Negative for activity change, appetite change, fatigue and unexpected weight change.  HENT: Negative for congestion, postnasal drip and sinus pressure.   Respiratory: Positive for cough. Negative for chest tightness, shortness of breath and wheezing.   Cardiovascular: Negative for chest pain and palpitations.    Past Medical History:  Diagnosis Date  . Cervicalgia 03/29/2015  . History of colonic polyps   . Hyperlipidemia   . Hypertension   . Thrombocytopenia ([place])   . Vitamin D deficiency    Past Surgical History:  Procedure Laterality Date  . ARM SKIN LESION BIOPSY / EXCISION Left 02/2016   atypical lentigmous and nested melanocytic proliferation--Dr. Harriett Sine   Social History:   reports that he has never smoked. He has never used smokeless tobacco. He reports that he drinks alcohol. He reports that he does not use drugs.  Family History  Problem Relation Age of  Onset  . Clotting disorder Father   . Suicidality Father   . High Cholesterol Mother   . Renal Disease Mother   . Stroke Mother 45    Medications: Patient's Medications  New Prescriptions   No medications on file  Previous Medications   ASPIRIN EC 81 MG TABLET    Take 81 mg by mouth daily.   FENOFIBRATE 160 MG TABLET    TAKE 1 TABLET BY MOUTH  DAILY   HYDROCHLOROTHIAZIDE (HYDRODIURIL) 25 MG TABLET    TAKE 1 TABLET BY MOUTH  DAILY   LISINOPRIL (PRINIVIL,ZESTRIL) 40 MG TABLET    TAKE 1 TABLET BY MOUTH  DAILY  Modified Medications   No medications on file  Discontinued Medications   No medications on file     Physical Exam:  Vitals:   01/29/17 1453  BP: 122/80  Pulse: 74  Resp: 18  Temp: 97.6 F (36.4 C)  TempSrc: Oral  SpO2: 96%  Weight: 191 lb 9.6 oz (86.9 kg)  Height: 6\' 1"  (1.854 m)   Body mass index is 25.28 kg/m.  Physical Exam  Constitutional: He is oriented to person, place, and time. He appears well-developed and well-nourished. No distress.  HENT:  Head: Normocephalic and atraumatic.  Right Ear: External ear normal.  Left Ear: External ear normal.  Nose: Nose normal.  Mouth/Throat: Oropharynx is clear and moist. No oropharyngeal exudate.  Eyes: Conjunctivae and EOM are normal. Pupils are equal,  round, and reactive to light.  glasses  Neck: Normal range of motion. Neck supple.  Cardiovascular: Normal rate, regular rhythm, normal heart sounds and intact distal pulses.   Pulmonary/Chest: Effort normal and breath sounds normal. No respiratory distress.  Musculoskeletal: He exhibits no edema.  Neurological: He is alert and oriented to person, place, and time.    Labs reviewed: Basic Metabolic Panel:  Recent Labs  04/04/16 0910  NA 141  K 3.9  CL 102  CO2 27  GLUCOSE 80  BUN 18  CREATININE 1.17  CALCIUM 9.4   Liver Function Tests:  Recent Labs  04/04/16 0910  AST 22  ALT 18  ALKPHOS 41  BILITOT 0.5  PROT 6.0  ALBUMIN 4.2   No  results for input(s): LIPASE, AMYLASE in the last 8760 hours. No results for input(s): AMMONIA in the last 8760 hours. CBC:  Recent Labs  04/04/16 0910 10/23/16 0807  WBC 5.9 5.0  NEUTROABS 4.0 3,500  HGB  --  15.2  HCT 43.4 44.6  MCV 88 90.8  PLT 150 184   Lipid Panel:  Recent Labs  04/04/16 0910 10/23/16 0807  CHOL 147 118  HDL 52 45  LDLCALC 81 64  TRIG 70 46  CHOLHDL 2.8 2.6   TSH: No results for input(s): TSH in the last 8760 hours. A1C: No results found for: HGBA1C   Assessment/Plan 1. Cough Most likely post-viral, lungs clear and pt without other symptoms provided reassurance  -also discussed hay fever and seasonal allergies which can cause increase cough, may use Claritin 10 mg OTC daily as needed   Janvi Ammar K. Harle Battiest  [facility] [phone] 8 am - 5 pm) [phone] (after hours)

## 2017-02-22 ENCOUNTER — Telehealth: Payer: Self-pay | Admitting: *Deleted

## 2017-02-22 MED ORDER — ZOSTER VAC RECOMB ADJUVANTED 50 MCG/0.5ML IM SUSR: 0.5000 mL | Freq: Once | INTRAMUSCULAR | 1 refills | Status: AC

## 2017-02-22 NOTE — Telephone Encounter (Signed)
Patient notified and agreed. Faxed Rx to pharmacy.  

## 2017-02-22 NOTE — Telephone Encounter (Signed)
Patient called and requested a Rx to be sent to his pharmacy to get the Shinglrex.  He also stated that he rides a bike and was fixing to leave on a long bike ride and wondered if he should get it before he left tomorrow. I instructed him to wait just in case there were any possible side effects. He agreed but would like Rx faxed to pharmacy for when he gets back.  Please Advise.

## 2017-02-22 NOTE — Telephone Encounter (Signed)
Ok to send Rx for shingrix with one refill so he can get both shots.  Please ask him to have them send [place] proof when it's been given.  Thanks.

## 2017-04-07 ENCOUNTER — Other Ambulatory Visit: Payer: Self-pay | Admitting: Internal Medicine

## 2017-05-13 ENCOUNTER — Other Ambulatory Visit: Payer: Self-pay | Admitting: Internal Medicine

## 2017-05-13 ENCOUNTER — Other Ambulatory Visit: Payer: Medicare Other

## 2017-05-13 DIAGNOSIS — E785 Hyperlipidemia, unspecified: Secondary | ICD-10-CM | POA: Diagnosis not present

## 2017-05-13 DIAGNOSIS — Z1159 Encounter for screening for other viral diseases: Secondary | ICD-10-CM | POA: Diagnosis not present

## 2017-05-13 DIAGNOSIS — D696 Thrombocytopenia, unspecified: Secondary | ICD-10-CM

## 2017-05-13 DIAGNOSIS — I10 Essential (primary) hypertension: Secondary | ICD-10-CM | POA: Diagnosis not present

## 2017-05-13 LAB — CBC WITH DIFFERENTIAL/PLATELET
Basophils Absolute: 0 cells/uL (ref 0–200)
Basophils Relative: 0 %
Eosinophils Absolute: 130 cells/uL (ref 15–500)
Eosinophils Relative: 2 %
HCT: 45.4 % (ref 38.5–50.0)
Hemoglobin: 15.3 g/dL (ref 13.2–17.1)
Lymphocytes Relative: 15 %
Lymphs Abs: 975 cells/uL (ref 850–3900)
MCH: 30.9 pg (ref 27.0–33.0)
MCHC: 33.7 g/dL (ref 32.0–36.0)
MCV: 91.7 fL (ref 80.0–100.0)
MPV: 10.8 fL (ref 7.5–12.5)
Monocytes Absolute: 585 cells/uL (ref 200–950)
Monocytes Relative: 9 %
Neutro Abs: 4810 cells/uL (ref 1500–7800)
Neutrophils Relative %: 74 %
Platelets: 152 10*3/uL (ref 140–400)
RBC: 4.95 MIL/uL (ref 4.20–5.80)
RDW: 13.5 % (ref 11.0–15.0)
WBC: 6.5 10*3/uL (ref 3.8–10.8)

## 2017-05-13 LAB — COMPLETE METABOLIC PANEL WITH GFR
ALT: 18 U/L (ref 9–46)
AST: 16 U/L (ref 10–35)
Albumin: 4 g/dL (ref 3.6–5.1)
Alkaline Phosphatase: 44 U/L (ref 40–115)
BUN: 24 mg/dL (ref 7–25)
CO2: 28 mmol/L (ref 20–31)
Calcium: 9.3 mg/dL (ref 8.6–10.3)
Chloride: 105 mmol/L (ref 98–110)
Creat: 1.22 mg/dL — ABNORMAL HIGH (ref 0.70–1.18)
GFR, Est African American: 69 mL/min (ref >=60)
GFR, Est Non African American: 60 mL/min (ref >=60)
Glucose, Bld: 91 mg/dL (ref 65–99)
Potassium: 4.2 mmol/L (ref 3.5–5.3)
Sodium: 141 mmol/L (ref 135–146)
Total Bilirubin: 0.7 mg/dL (ref 0.2–1.2)
Total Protein: 6.2 g/dL (ref 6.1–8.1)

## 2017-05-13 LAB — LIPID PANEL
Cholesterol: 161 mg/dL (ref <200)
HDL: 51 mg/dL (ref >40)
LDL Cholesterol: 98 mg/dL (ref <100)
Total CHOL/HDL Ratio: 3.2 Ratio (ref <5.0)
Triglycerides: 59 mg/dL (ref <150)
VLDL: 12 mg/dL (ref <30)

## 2017-05-14 LAB — HEPATITIS C ANTIBODY: HCV Ab: NEGATIVE

## 2017-05-16 ENCOUNTER — Encounter: Payer: Self-pay | Admitting: Internal Medicine

## 2017-05-16 ENCOUNTER — Ambulatory Visit (INDEPENDENT_AMBULATORY_CARE_PROVIDER_SITE_OTHER): Payer: Medicare Other | Admitting: Internal Medicine

## 2017-05-16 VITALS — BP 120/70 | HR 72 | Temp 98.2°F | Wt 178.0 lb

## 2017-05-16 DIAGNOSIS — E785 Hyperlipidemia, unspecified: Secondary | ICD-10-CM

## 2017-05-16 DIAGNOSIS — R39198 Other difficulties with micturition: Secondary | ICD-10-CM | POA: Diagnosis not present

## 2017-05-16 DIAGNOSIS — R351 Nocturia: Secondary | ICD-10-CM | POA: Diagnosis not present

## 2017-05-16 DIAGNOSIS — I10 Essential (primary) hypertension: Secondary | ICD-10-CM

## 2017-05-16 DIAGNOSIS — H401411 Capsular glaucoma with pseudoexfoliation of lens, right eye, mild stage: Secondary | ICD-10-CM | POA: Diagnosis not present

## 2017-05-16 NOTE — Progress Notes (Signed)
Location:  [facility] clinic Provider:  Briana Farner L. Mariea Clonts, D.O., C.M.D.  Code Status: full code at initial discussion Goals of Care:  Advanced Directives 01/29/2017  Does Patient Have a Medical Advance Directive? Yes  Type of Paramedic of [place];Living will  Does patient want to make changes to medical advance directive? -  Copy of [place] in Chart? -    Chief Complaint  Patient presents with  . Medical Management of Chronic Issues    8mth follow-up    HPI: Patient is a 71 y.o. male seen today for medical management of chronic diseases.    He's back from his [place].  3100 miles in 2 months.  He's been back since the 6/26.  He's lost 14 lbs per our scale.  He's needing to wear his belt to hold up his pants.  BMI now 23.  Was 54.  He only had 3 rainy days where he rode.    Blood pressure at goal.    He comes with a list.  He's been getting up 2-3 times to urinate ongoing.  In the last few days, something is different.  He was up eery 2 hrs last night (4x).  Wonders if related to biking.  He'll get up to go, be ready to go and it takes a while to get started.  Once it starts, it goes, but not full flow.  No dysuria, no pain.  Hasn't ridden for 3 weeks.  No increased urgency overall.    He's taking baby asa EC 81mg .  Read a [place] times article suggesting he should take 325mg  instead due to weighing more than 154lbs.  LDL has trended up for him.  Ate more ice cream and fried chicken at convenience stores.  Discussed dietary changes now that he's back home.    Discussed very minor bump in his creatinine likely due to lack of adequate hydration over a 2 month period.  Platelets are now normal.  Had 2-3 days of lethargy.  Now he feels fine.  Discussed it could be related to less activity.  Also adjusting to being back home with [place] again.    Past Medical History:  Diagnosis Date  . Cervicalgia 03/29/2015  . History of colonic polyps    . Hyperlipidemia   . Hypertension   . Thrombocytopenia ([place])   . Vitamin D deficiency     Past Surgical History:  Procedure Laterality Date  . ARM SKIN LESION BIOPSY / EXCISION Left 02/2016   atypical lentigmous and nested melanocytic proliferation--Dr. Harriett Sine    Allergies  Allergen Reactions  . Zocor [Simvastatin] Other (See Comments)    Memory loss  . Niaspan [Niacin Er]     Allergies as of 05/16/2017      Reactions   Zocor [simvastatin] Other (See Comments)   Memory loss   Niaspan [niacin Er]       Medication List       Accurate as of 05/16/17  9:04 AM. Always use your most recent med list.          aspirin EC 81 MG tablet Take 81 mg by mouth daily.   fenofibrate 160 MG tablet TAKE 1 TABLET BY MOUTH  DAILY   Flaxseed Oil 1000 MG Caps Take by mouth daily.   hydrochlorothiazide 25 MG tablet Commonly known as:  HYDRODIURIL TAKE 1 TABLET BY MOUTH  DAILY   lisinopril 40 MG tablet Commonly known as:  PRINIVIL,ZESTRIL TAKE 1 TABLET  BY MOUTH  DAILY   vitamin B-12 1000 MCG tablet Commonly known as:  CYANOCOBALAMIN Take 1,000 mcg by mouth daily.   Vitamin D3 1000 units Caps Take by mouth daily.       Review of Systems:  Review of Systems  Constitutional: Positive for weight loss. Negative for chills, fever and malaise/fatigue.  HENT: Negative for congestion and hearing loss.   Eyes: Negative for blurred vision.  Respiratory: Negative for cough and shortness of breath.   Cardiovascular: Negative for chest pain, palpitations and leg swelling.  Gastrointestinal: Negative for abdominal pain, blood in stool, constipation, diarrhea, heartburn, melena, nausea and vomiting.  Genitourinary: Positive for frequency. Negative for dysuria, flank pain, hematuria and urgency.  Musculoskeletal: Negative for falls and joint pain.  Skin: Negative for itching and rash.  Neurological: Negative for dizziness, loss of consciousness and weakness.    Endo/Heme/Allergies: Does not bruise/bleed easily.  Psychiatric/Behavioral: Negative for depression and memory loss.    Health Maintenance  Topic Date Due  . INFLUENZA VACCINE  05/29/2017  . TETANUS/TDAP  10/29/2017  . COLONOSCOPY  07/14/2018  . Hepatitis C Screening  Completed  . PNA vac Low Risk Adult  Completed    Physical Exam: Vitals:   05/16/17 0827  BP: 120/70  Pulse: 72  Temp: 98.2 F (36.8 C)  TempSrc: Oral  SpO2: 95%  Weight: 178 lb (80.7 kg)   Body mass index is 23.48 kg/m. Physical Exam  Constitutional: He is oriented to person, place, and time. He appears well-developed and well-nourished. No distress.  Cardiovascular: Normal rate, regular rhythm, normal heart sounds and intact distal pulses.   No murmur heard. Pulmonary/Chest: Effort normal and breath sounds normal. No respiratory distress. He has no wheezes.  Abdominal: Soft. Bowel sounds are normal. He exhibits no distension. There is no tenderness.  Genitourinary: Rectum normal and penis normal.  Genitourinary Comments: Prostate seems diffusely enlarged but no palpable lumps   Musculoskeletal: Normal range of motion. He exhibits no tenderness.  Neurological: He is alert and oriented to person, place, and time. No cranial nerve deficit.  Some chronic word-finding difficulty  Skin: Skin is warm and dry. Capillary refill takes less than 2 seconds.  Psychiatric: He has a normal mood and affect.    Labs reviewed: Basic Metabolic Panel:  Recent Labs  05/13/17 0840  NA 141  K 4.2  CL 105  CO2 28  GLUCOSE 91  BUN 24  CREATININE 1.22*  CALCIUM 9.3   Liver Function Tests:  Recent Labs  05/13/17 0840  AST 16  ALT 18  ALKPHOS 44  BILITOT 0.7  PROT 6.2  ALBUMIN 4.0   No results for input(s): LIPASE, AMYLASE in the last 8760 hours. No results for input(s): AMMONIA in the last 8760 hours. CBC:  Recent Labs  10/23/16 0807 05/13/17 0840  WBC 5.0 6.5  NEUTROABS 3,500 4,810  HGB 15.2 15.3   HCT 44.6 45.4  MCV 90.8 91.7  PLT 184 152   Lipid Panel:  Recent Labs  10/23/16 0807 05/13/17 0840  CHOL 118 161  HDL 45 51  LDLCALC 64 98  TRIG 46 59  CHOLHDL 2.6 3.2   Assessment/Plan 1. Essential hypertension - bp at goal with hctz and lisinopril  - CBC with Differential/Platelet; Future - Basic metabolic panel; Future  2. Hyperlipidemia, unspecified hyperlipidemia type - cont fenofibrate therapy, LDL trended up when he was eating high fat foods during cycling - Lipid panel; Future  3. Slow urinary stream - new onset  in past 3-4 days - seems to have enlarged prostate by my exam - PSA to determine level and will start flomax if only mildly elevated, but refer to urology if significantly elevated  4. Nocturia more than twice per night - has worsened recently also, see #3 - PSA  Labs/tests ordered:   Orders Placed This Encounter  Procedures  . PSA  . Lipid panel    Standing Status:   Future    Standing Expiration Date:   05/16/2018    Order Specific Question:   Has the patient fasted?    Answer:   Yes  . CBC with Differential/Platelet    Standing Status:   Future    Standing Expiration Date:   05/16/2018  . Basic metabolic panel    Standing Status:   Future    Standing Expiration Date:   05/16/2018    Order Specific Question:   Has the patient fasted?    Answer:   Yes    Next appt:  6 mos for EV (pt with medicare so not official cpe)  Alesa Echevarria L. Shanda Cadotte, D.O. [facility] [address], [place] [address] Cell Phone (Mon-Fri 8am-5pm):  [phone] On Call:  [phone] & follow prompts after 5pm & weekends Office Phone:  [phone] Office Fax:  [phone]

## 2017-05-17 ENCOUNTER — Encounter: Payer: Self-pay | Admitting: Internal Medicine

## 2017-05-17 DIAGNOSIS — L821 Other seborrheic keratosis: Secondary | ICD-10-CM | POA: Diagnosis not present

## 2017-05-17 DIAGNOSIS — D1801 Hemangioma of skin and subcutaneous tissue: Secondary | ICD-10-CM | POA: Diagnosis not present

## 2017-05-17 DIAGNOSIS — L570 Actinic keratosis: Secondary | ICD-10-CM | POA: Diagnosis not present

## 2017-05-17 DIAGNOSIS — C44519 Basal cell carcinoma of skin of other part of trunk: Secondary | ICD-10-CM | POA: Diagnosis not present

## 2017-05-17 DIAGNOSIS — L814 Other melanin hyperpigmentation: Secondary | ICD-10-CM | POA: Diagnosis not present

## 2017-05-17 DIAGNOSIS — D225 Melanocytic nevi of trunk: Secondary | ICD-10-CM | POA: Diagnosis not present

## 2017-05-17 DIAGNOSIS — Z85828 Personal history of other malignant neoplasm of skin: Secondary | ICD-10-CM | POA: Diagnosis not present

## 2017-05-17 DIAGNOSIS — D485 Neoplasm of uncertain behavior of skin: Secondary | ICD-10-CM | POA: Diagnosis not present

## 2017-05-17 DIAGNOSIS — D692 Other nonthrombocytopenic purpura: Secondary | ICD-10-CM | POA: Diagnosis not present

## 2017-05-17 DIAGNOSIS — Z85820 Personal history of malignant melanoma of skin: Secondary | ICD-10-CM | POA: Diagnosis not present

## 2017-05-17 LAB — PSA: PSA: 1 ng/mL (ref <=4.0)

## 2017-05-17 MED ORDER — ZOSTER VAC RECOMB ADJUVANTED 50 MCG/0.5ML IM SUSR: 0.5000 mL | Freq: Once | INTRAMUSCULAR | 1 refills | Status: AC

## 2017-05-20 ENCOUNTER — Other Ambulatory Visit: Payer: Self-pay | Admitting: *Deleted

## 2017-05-20 MED ORDER — TAMSULOSIN HCL 0.4 MG PO CAPS: 0.4000 mg | ORAL_CAPSULE | Freq: Every day | ORAL | 0 refills | Status: DC

## 2017-05-20 NOTE — Telephone Encounter (Signed)
Spoke with patient about lab results, he understood and didn't have any questions. rx sent to pharmacy by e-script

## 2017-05-30 ENCOUNTER — Ambulatory Visit (INDEPENDENT_AMBULATORY_CARE_PROVIDER_SITE_OTHER): Payer: Medicare Other | Admitting: Internal Medicine

## 2017-05-30 ENCOUNTER — Encounter: Payer: Self-pay | Admitting: Internal Medicine

## 2017-05-30 VITALS — BP 112/60 | HR 60 | Temp 98.0°F | Wt 180.0 lb

## 2017-05-30 DIAGNOSIS — N410 Acute prostatitis: Secondary | ICD-10-CM

## 2017-05-30 DIAGNOSIS — R319 Hematuria, unspecified: Secondary | ICD-10-CM | POA: Diagnosis not present

## 2017-05-30 LAB — POCT URINALYSIS DIPSTICK
Bilirubin, UA: NEGATIVE
Glucose, UA: NEGATIVE
Ketones, UA: NEGATIVE
Nitrite, UA: NEGATIVE
Spec Grav, UA: 1.015 (ref 1.010–1.025)
Urobilinogen, UA: NEGATIVE E.U./dL — AB
pH, UA: 6 (ref 5.0–8.0)

## 2017-05-30 MED ORDER — SULFAMETHOXAZOLE-TRIMETHOPRIM 800-160 MG PO TABS: 1.0000 | ORAL_TABLET | Freq: Two times a day (BID) | ORAL | 0 refills | Status: DC

## 2017-05-30 NOTE — Patient Instructions (Signed)
Prostatitis Prostatitis is swelling or inflammation of the prostate gland. The prostate is a walnut-sized gland that is involved in the production of semen. It is located below a man's bladder, in front of the rectum. There are four types of prostatitis:  Chronic nonbacterial prostatitis. This is the most common type of prostatitis. It may be associated with a viral infection or autoimmune disorder.  Acute bacterial prostatitis. This is the least common type of prostatitis. It starts quickly and is usually associated with a bladder infection, high fever, and shaking chills. It can occur at any age.  Chronic bacterial prostatitis. This type usually results from acute bacterial prostatitis that happens repeatedly (is recurrent) or has not been treated properly. It can occur in men of any age but is most common among middle-aged men whose prostate has begun to get larger. The symptoms are not as severe as symptoms caused by acute bacterial prostatitis.  Prostatodynia or chronic pelvic pain syndrome (CPPS). This type is also called pelvic floor disorder. It is associated with increased muscular tone in the pelvis surrounding the prostate. What are the causes? Bacterial prostatitis is caused by infection from bacteria. Chronic nonbacterial prostatitis may be caused by:  Urinary tract infections (UTIs).  Nerve damage.  A response by the body's disease-fighting system (autoimmune response).  Chemicals in the urine. The causes of the other types of prostatitis are usually not known. What are the signs or symptoms? Symptoms of this condition vary depending upon the type of prostatitis. If you have acute bacterial prostatitis, you may experience:  Urinary symptoms, such as:  Painful urination.  Burning during urination.  Frequent and sudden urges to urinate.  Inability to start urinating.  A weak or interrupted stream of urine.  Vomiting.  Nausea.  Fever.  Chills.  Inability to  empty the bladder completely.  Pain in the:  Muscles or joints.  Lower back.  Lower abdomen. If you have any of the other types of prostatitis, you may experience:  Urinary symptoms, such as:  Sudden urges to urinate.  Frequent urination.  Difficulty starting urination.  Weak urine stream.  Dribbling after urination.  Discharge from the urethra. The urethra is a tube that opens at the end of the penis.  Pain in the:  Testicles.  Penis or tip of the penis.  Rectum.  Area in front of the rectum and below the scrotum (perineum).  Problems with sexual function.  Painful ejaculation.  Bloody semen. How is this diagnosed? This condition may be diagnosed based on:  A physical and medical exam.  Your symptoms.  A urine test to check for bacteria.  An exam in which a health care provider uses a finger to feel the prostate (digital rectal exam).  A test of a sample of semen.  Blood tests.  Ultrasound.  Removal of prostate tissue to be examined under a microscope (biopsy).  Tests to check how your body handles urine (urodynamic tests).  A test to look inside your bladder or urethra (cystoscopy). How is this treated? Treatment for this condition depends on the type of prostatitis. Treatment may involve:  Medicines to relieve pain or inflammation.  Medicines to help relax your muscles.  Physical therapy.  Heat therapy.  Techniques to help you control certain body functions (biofeedback).  Relaxation exercises.  Antibiotic medicine, if your condition is caused by bacteria.  Warm water baths (sitz baths). Sitz baths help with relaxing your pelvic floor muscles, which helps to relieve pressure on the prostate. Follow   these instructions at home:  Take over-the-counter and prescription medicines only as told by your health care provider.  If you were prescribed an antibiotic, take it as told by your health care provider. Do not stop taking the  antibiotic even if you start to feel better.  If physical therapy, biofeedback, or relaxation exercises were prescribed, do exercises as instructed.  Take sitz baths as directed by your health care provider. For a sitz bath, sit in warm water that is deep enough to cover your hips and buttocks.  Keep all follow-up visits as told by your health care provider. This is important. Contact a health care provider if:  Your symptoms get worse.  You have a fever. Get help right away if:  You have chills.  You feel nauseous.  You vomit.  You feel light-headed or feel like you are going to faint.  You are unable to urinate.  You have blood or blood clots in your urine. This information is not intended to replace advice given to you by your health care provider. Make sure you discuss any questions you have with your health care provider. Document Released: 10/12/2000 Document Revised: 07/05/2016 Document Reviewed: 07/05/2016 Elsevier Interactive Patient Education  2017 [employer]vier Inc.  

## 2017-05-30 NOTE — Progress Notes (Signed)
Location:  [facility] clinic Provider: Fallou Hulbert L. Mariea Clonts, D.O., C.M.D.  Code Status: DNR Goals of Care:  Advanced Directives 01/29/2017  Does Patient Have a Medical Advance Directive? Yes  Type of Paramedic of [place];Living will  Does patient want to make changes to medical advance directive? -  Copy of [place] in Chart? -   Chief Complaint  Patient presents with  . Acute Visit    blood in urine    HPI: Patient is a 71 y.o. male seen today for an acute visit for hematuria this am.  At his last visit, he was having decreased urinary stream and increased frequency after returning from a long bike trip.  His prostate was enlarged when I examined him at that visit, but PSA only 1.  We started him on flomax aout 1.5 wks ago b/c he had no dysuria or hematuria at that point.  He reports that after that, he developed pressure over his bladder for a few days that then resolved.  He now developed hematuria this am that he first noted at 4:30am.  It recurred 30 mins later and it happened 3-4 times.  He drank more water and it gradually cleared.  The second episode was bright red and frightened him.  By 7:30-8am, it was just yellow.  He was outside working yesterday in the yard exerting himself which was not different than the norm.  He does not have dysuria today.  His frequency has remained a problem.  He's been afebrile w/o chills.  He otherwise feels fine.    Past Medical History:  Diagnosis Date  . Cervicalgia 03/29/2015  . History of colonic polyps   . Hyperlipidemia   . Hypertension   . Thrombocytopenia ([place])   . Vitamin D deficiency     Past Surgical History:  Procedure Laterality Date  . ARM SKIN LESION BIOPSY / EXCISION Left 02/2016   atypical lentigmous and nested melanocytic proliferation--Dr. Harriett Sine    Allergies  Allergen Reactions  . Zocor [Simvastatin] Other (See Comments)    Memory loss  . Niaspan [Niacin Er]     Allergies  as of 05/30/2017      Reactions   Zocor [simvastatin] Other (See Comments)   Memory loss   Niaspan [niacin Er]       Medication List       Accurate as of 05/30/17 12:18 PM. Always use your most recent med list.          aspirin EC 81 MG tablet Take 81 mg by mouth daily.   fenofibrate 160 MG tablet TAKE 1 TABLET BY MOUTH  DAILY   Flaxseed Oil 1000 MG Caps Take by mouth daily.   hydrochlorothiazide 25 MG tablet Commonly known as:  HYDRODIURIL TAKE 1 TABLET BY MOUTH  DAILY   lisinopril 40 MG tablet Commonly known as:  PRINIVIL,ZESTRIL TAKE 1 TABLET BY MOUTH  DAILY   tamsulosin 0.4 MG Caps capsule Commonly known as:  FLOMAX Take 1 capsule (0.4 mg total) by mouth daily.   vitamin B-12 1000 MCG tablet Commonly known as:  CYANOCOBALAMIN Take 1,000 mcg by mouth daily.   Vitamin D3 1000 units Caps Take by mouth daily.       Review of Systems:  Review of Systems  Constitutional: Negative for chills, fever and malaise/fatigue.  HENT: Negative for hearing loss.   Eyes:       Glasses  Respiratory: Negative for shortness of breath.   Cardiovascular: Negative for  chest pain and palpitations.  Gastrointestinal: Negative for abdominal pain.  Genitourinary: Positive for dysuria, frequency and hematuria. Negative for flank pain and urgency.  Musculoskeletal: Negative for falls, joint pain and myalgias.  Neurological: Negative for weakness.  Psychiatric/Behavioral: Negative for depression. The patient is nervous/anxious.     Health Maintenance  Topic Date Due  . INFLUENZA VACCINE  05/29/2017  . TETANUS/TDAP  10/29/2017  . COLONOSCOPY  07/14/2018  . Hepatitis C Screening  Completed  . PNA vac Low Risk Adult  Completed    Physical Exam: Vitals:   05/30/17 1122  BP: 112/60  Pulse: 60  Temp: 98 F (36.7 C)  TempSrc: Oral  SpO2: 94%  Weight: 180 lb (81.6 kg)   Body mass index is 23.75 kg/m. Physical Exam  Constitutional: He is oriented to person, place, and  time. He appears well-developed and well-nourished. No distress.  Cardiovascular: Normal rate, regular rhythm and normal heart sounds.   Pulmonary/Chest: Effort normal and breath sounds normal. No respiratory distress.  Abdominal: Soft. Bowel sounds are normal. He exhibits no distension and no mass. There is no tenderness. There is no rebound and no guarding. No hernia.  Musculoskeletal: Normal range of motion.  Neurological: He is alert and oriented to person, place, and time.  Skin: Skin is warm and dry.  Psychiatric: He has a normal mood and affect.  Anxious today, usual word-finding difficulty    Labs reviewed: Basic Metabolic Panel:  Recent Labs  05/13/17 0840  NA 141  K 4.2  CL 105  CO2 28  GLUCOSE 91  BUN 24  CREATININE 1.22*  CALCIUM 9.3   Liver Function Tests:  Recent Labs  05/13/17 0840  AST 16  ALT 18  ALKPHOS 44  BILITOT 0.7  PROT 6.2  ALBUMIN 4.0   No results for input(s): LIPASE, AMYLASE in the last 8760 hours. No results for input(s): AMMONIA in the last 8760 hours. CBC:  Recent Labs  10/23/16 0807 05/13/17 0840  WBC 5.0 6.5  NEUTROABS 3,500 4,810  HGB 15.2 15.3  HCT 44.6 45.4  MCV 90.8 91.7  PLT 184 152   Lipid Panel:  Recent Labs  10/23/16 0807 05/13/17 0840  CHOL 118 161  HDL 45 51  LDLCALC 64 98  TRIG 46 59  CHOLHDL 2.6 3.2   Assessment/Plan 1. Hematuria, unspecified type - POC Urinalysis Dipstick positive for blood and wbcs, no nitrite - Urine Culture sent - sulfamethoxazole-trimethoprim (BACTRIM DS,SEPTRA DS) 800-160 MG tablet; Take 1 tablet by mouth 2 (two) times daily.  Dispense: 14 tablet; Refill: 0  2. Acute prostatitis - seems to explain all symptoms, more subacute actually - push fluids - sulfamethoxazole-trimethoprim (BACTRIM DS,SEPTRA DS) 800-160 MG tablet; Take 1 tablet by mouth 2 (two) times daily.  Dispense: 14 tablet; Refill: 0 - eat yogurt while on abx to maintain bowel flora--let me know if not better after  2 wks -stop flomax b/c I suspect his prostatitis was brewing last visit and explained his enlarged prostate, abnormal stream and increased frequency  Labs/tests ordered:   Orders Placed This Encounter  Procedures  . Urine Culture  . POC Urinalysis Dipstick    Next appt:  11/12/2017  Juddson Cobern L. Kriston Pasquarello, D.O. [facility] [address], [place] [address] Cell Phone (Mon-Fri 8am-5pm):  [phone] On Call:  [phone] & follow prompts after 5pm & weekends Office Phone:  [phone] Office Fax:  [phone]

## 2017-05-31 LAB — URINE CULTURE: Organism ID, Bacteria: NO GROWTH

## 2017-10-28 ENCOUNTER — Ambulatory Visit (INDEPENDENT_AMBULATORY_CARE_PROVIDER_SITE_OTHER): Payer: Medicare Other

## 2017-10-28 VITALS — BP 128/78 | HR 66 | Temp 97.8°F | Ht 73.0 in | Wt 194.0 lb

## 2017-10-28 DIAGNOSIS — Z0000 Encounter for general adult medical examination without abnormal findings: Secondary | ICD-10-CM

## 2017-10-28 NOTE — Progress Notes (Signed)
Subjective:   Brandon [name] is a 71 y.o. male who presents for Medicare Annual/Subsequent preventive examination.  Last AWV-10/25/2016       Objective:    Vitals: BP 128/78 (BP Location: Right Arm, Patient Position: Sitting)   Pulse 66   Temp 97.8 F (36.6 C) (Oral)   Ht 6\' 1"  (1.854 m)   Wt 194 lb (88 kg)   SpO2 96%   BMI 25.60 kg/m   Body mass index is 25.6 kg/m.  Advanced Directives 10/28/2017 01/29/2017 10/25/2016 04/06/2016 11/14/2015 09/26/2015 03/21/2015  Does Patient Have a Medical Advance Directive? Yes Yes Yes Yes Yes Yes Yes  Type of Paramedic of Brandon [name];Living will Brandon [name];Living will Healthcare Power of Brandon [name] of Brandon [name] of Brandon [name];Living will  Does patient want to make changes to medical advance directive? No - Patient declined - - - No - Patient declined - No - Patient declined  Copy of [place] in Chart? Yes - Yes Yes Yes Yes Yes    Tobacco Social History   Tobacco Use  Smoking Status Never Smoker  Smokeless Tobacco Never Used     Counseling given: Not Answered   Clinical Intake:  Pre-visit preparation completed: No  Pain : No/denies pain     Nutritional Risks: None Diabetes: No  How often do you need to have someone help you when you read instructions, pamphlets, or other written materials from your doctor or pharmacy?: 2 - Rarely What is the last grade level you completed in school?: Masters  Interpreter Needed?: No  Information entered by :: Brandon Doing, RN  Past Medical History:  Diagnosis Date  . Cervicalgia 03/29/2015  . History of colonic polyps   . Hyperlipidemia   . Hypertension   . Thrombocytopenia ([place])   . Vitamin D deficiency    Past Surgical History:  Procedure Laterality Date  . ARM SKIN LESION BIOPSY / EXCISION Left 02/2016   atypical lentigmous and nested melanocytic proliferation--Dr.  Harriett Sine  . CATARACT EXTRACTION Bilateral january and february 2018   Family History  Problem Relation Age of Onset  . Clotting disorder Father   . Suicidality Father   . High Cholesterol Mother   . Renal Disease Mother   . Stroke Mother 69   Social History   Socioeconomic History  . Marital status: Married    Spouse name: None  . Number of children: None  . Years of education: None  . Highest education level: None  Social Needs  . Financial resource strain: Not hard at all  . Food insecurity - worry: Never true  . Food insecurity - inability: Never true  . Transportation needs - medical: No  . Transportation needs - non-medical: No  Occupational History  . None  Tobacco Use  . Smoking status: Never Smoker  . Smokeless tobacco: Never Used  Substance and Sexual Activity  . Alcohol use: Yes    Alcohol/week: 0.0 oz    Comment: 3 drinks a week  . Drug use: No  . Sexual activity: Not Currently  Other Topics Concern  . None  Social History Narrative   Diet- well balanced   Caffeine- Yes (coffee)   Married- Yes, Brandon [name], 1 story with 2 persons   Pets- 1 cat   Current/Past profession- Gaffer   Exercise- Yes ( walking and aerobics)   Living Will- Yes   DNR- N/A  POA/HPOA- N/A       Outpatient Encounter Medications as of 10/28/2017  Medication Sig  . aspirin EC 81 MG tablet Take 81 mg by mouth daily.  . Cholecalciferol (VITAMIN D3) 1000 units CAPS Take by mouth daily.  . fenofibrate 160 MG tablet TAKE 1 TABLET BY MOUTH  DAILY  . Flaxseed, Linseed, (FLAXSEED OIL) 1000 MG CAPS Take by mouth daily.  . hydrochlorothiazide (HYDRODIURIL) 25 MG tablet TAKE 1 TABLET BY MOUTH  DAILY  . lisinopril (PRINIVIL,ZESTRIL) 40 MG tablet TAKE 1 TABLET BY MOUTH  DAILY  . vitamin B-12 (CYANOCOBALAMIN) 1000 MCG tablet Take 1,000 mcg by mouth daily.  . [DISCONTINUED] sulfamethoxazole-trimethoprim (BACTRIM DS,SEPTRA DS) 800-160 MG tablet Take 1 tablet by  mouth 2 (two) times daily.   No facility-administered encounter medications on file as of 10/28/2017.     Activities of Daily Living In your present state of health, do you have any difficulty performing the following activities: 10/28/2017  Hearing? Y  Vision? N  Difficulty concentrating or making decisions? Y  Walking or climbing stairs? N  Dressing or bathing? N  [name] errands, shopping? N  Preparing Food and eating ? N  Using the Toilet? N  In the past six months, have you accidently leaked urine? N  Do you have problems with loss of bowel control? N  Managing your Medications? N  Managing your Finances? N  Housekeeping or managing your Housekeeping? N  Some recent data might be hidden    Patient Care Team: Brandon Curry, DO as PCP - General (Geriatric Medicine)   Assessment:   This is a routine wellness examination for Brandon [name].  Exercise Activities and Dietary recommendations Current Exercise Habits: Home exercise routine, Type of exercise: Other - see comments(biking), Time (Minutes): 30, Frequency (Times/Week): 2, Weekly Exercise (Minutes/Week): 60, Exercise limited by: None identified  Goals    . Patient Stated     He wants to bike more throughout the week to do a longer biking trip eventually.       Fall Risk Fall Risk  10/28/2017 05/16/2017 01/29/2017 10/25/2016 04/06/2016  Falls in the past year? Yes No No Yes No  Number falls in past yr: 2 or more - - 1 -  Comment - - - Pt missed a step while going down stairs and fell; hit his head. -  Injury with Fall? No - - Yes -  Comment - - - Bumps and bruises but none that warrented a ER visit.  -  Follow up - - - Education provided -   Is the patient's home free of loose throw rugs in walkways, pet beds, electrical cords, etc?   yes      Grab bars in the bathroom? yes      Handrails on the stairs?   yes      Adequate lighting?   yes  Timed Get Up and Go Performed: 25 seconds, fall risk  Depression Screen PHQ 2/9  Scores 10/28/2017 05/16/2017 10/25/2016 04/06/2016  PHQ - 2 Score 1 0 1 0    Cognitive Function MMSE - Mini Mental State Exam 10/25/2016 03/21/2015  Orientation to time 5 5  Orientation to Place 5 5  Registration 3 3  Attention/ Calculation 5 5  Recall 3 3  Language- name 2 objects 2 2  Language- repeat 1 1  Language- follow 3 step command 2 3  Language- read & follow direction 1 1  Write a sentence 1 1  Copy design 1 1  Total score 29 30        Immunization History  Administered Date(s) Administered  . Influenza,inj,Quad PF,6+ Mos 07/25/2015, 08/03/2016  . Pneumococcal Conjugate-13 03/21/2015  . Pneumococcal Polysaccharide-23 04/06/2016  . Tdap 10/30/2007  . Zoster 10/29/2012    Qualifies for Shingles Vaccine? Yes, educated and on waiting list at [place] Maintenance  Topic Date Due  . INFLUENZA VACCINE  05/29/2017  . TETANUS/TDAP  10/29/2017  . COLONOSCOPY  07/14/2018  . Hepatitis C Screening  Completed  . PNA vac Low Risk Adult  Completed   Cancer Screenings: Lung: Low Dose CT Chest recommended if Age 48-80 years, 30 pack-year currently smoking OR have quit w/in 15years. Patient does not qualify. Colorectal: up to date  Additional Screenings:  Hepatitis B/HIV/Syphillis:Not indicated Hepatitis C Screening: Not indicated    Plan:    I have personally reviewed and addressed the Medicare Annual Wellness questionnaire and have noted the following in the patient's chart:  A. Medical and social history B. Use of alcohol, tobacco or illicit drugs  C. Current medications and supplements D. Functional ability and status E.  Nutritional status F.  Physical activity G. Advance directives H. List of other physicians I.  Hospitalizations, surgeries, and ER visits in previous 12 months J.  [place] to include hearing, vision, cognitive, depression L. Referrals and appointments - none  In addition, I have reviewed and discussed with  patient certain preventive protocols, quality metrics, and best practice recommendations. A written personalized care plan for preventive services as well as general preventive health recommendations were provided to patient.  See attached scanned questionnaire for additional information.   Signed,   [name], RN Nurse Health Advisor   Quick Notes   Health Maintenance: On waiting list for Shingrix.      Abnormal Screen: MMSE 30/30 Passed clock drawing     Patient Concerns: Seeing dermatologist next month for top of head Pt c/o decline of memory and repeating things    Nurse Concerns: None

## 2017-10-28 NOTE — Patient Instructions (Signed)
Brandon [name] , Thank you for taking time to come for your Medicare Wellness Visit. I appreciate your ongoing commitment to your health goals. Please review the following plan we discussed and let me know if I can assist you in the future.   Screening recommendations/referrals: Colonoscopy up to date. Due 07/15/2023 Recommended yearly ophthalmology/optometry visit for glaucoma screening and checkup Recommended yearly dental visit for hygiene and checkup  Vaccinations: Influenza vaccine up to date. Due 2019 fall season Pneumococcal vaccine up to date Tdap vaccine up to date. Due 10/29/2017 Shingles vaccine due, on waiting is    Advanced directives: In Chart  Conditions/risks identified: none  Next appointment: Dr. Mariea Clonts 11/14/2017 @ 8:30am                       [name], RN 10/31/2018 @ 8:30am  Preventive Care 70 Years and Older, Male Preventive care refers to lifestyle choices and visits with your health care provider that can promote health and wellness. What does preventive care include?  A yearly physical exam. This is also called an annual well check.  Dental exams once or twice a year.  Routine eye exams. Ask your health care provider how often you should have your eyes checked.  Personal lifestyle choices, including:  Daily care of your teeth and gums.  Regular physical activity.  Eating a healthy diet.  Avoiding tobacco and drug use.  Limiting alcohol use.  Practicing safe sex.  Taking low doses of aspirin every day.  Taking vitamin and mineral supplements as recommended by your health care provider. What happens during an annual well check? The services and screenings done by your health care provider during your annual well check will depend on your age, overall health, lifestyle risk factors, and family history of disease. Counseling  Your health care provider may ask you questions about your:  Alcohol use.  Tobacco use.  Drug use.  Emotional  well-being.  Home and relationship well-being.  Sexual activity.  Eating habits.  History of falls.  Memory and ability to understand (cognition).  Work and work Statistician. Screening  You may have the following tests or measurements:  Height, weight, and BMI.  Blood pressure.  Lipid and cholesterol levels. These may be checked every 5 years, or more frequently if you are over 46 years old.  Skin check.  Lung cancer screening. You may have this screening every year starting at age 57 if you have a 30-pack-year history of smoking and currently smoke or have quit within the past 15 years.  Fecal occult blood test (FOBT) of the stool. You may have this test every year starting at age 44.  Flexible sigmoidoscopy or colonoscopy. You may have a sigmoidoscopy every 5 years or a colonoscopy every 10 years starting at age 52.  Prostate cancer screening. Recommendations will vary depending on your family history and other risks.  Hepatitis C blood test.  Hepatitis B blood test.  Sexually transmitted disease (STD) testing.  Diabetes screening. This is done by checking your blood sugar (glucose) after you have not eaten for a while (fasting). You may have this done every 1-3 years.  Abdominal aortic aneurysm (AAA) screening. You may need this if you are a current or former smoker.  Osteoporosis. You may be screened starting at age 24 if you are at high risk. Talk with your health care provider about your test results, treatment options, and if necessary, the need for more tests. Vaccines  Your health  care provider may recommend certain vaccines, such as:  Influenza vaccine. This is recommended every year.  Tetanus, diphtheria, and acellular pertussis (Tdap, Td) vaccine. You may need a Td booster every 10 years.  Zoster vaccine. You may need this after age 17.  Pneumococcal 13-valent conjugate (PCV13) vaccine. One dose is recommended after age 70.  Pneumococcal  polysaccharide (PPSV23) vaccine. One dose is recommended after age 70. Talk to your health care provider about which screenings and vaccines you need and how often you need them. This information is not intended to replace advice given to you by your health care provider. Make sure you discuss any questions you have with your health care provider. Document Released: 11/11/2015 Document Revised: 07/04/2016 Document Reviewed: 08/16/2015 Elsevier Interactive Patient Education  2017 [place] Prevention in the Home Falls can cause injuries. They can happen to people of all ages. There are many things you can do to make your home safe and to help prevent falls. What can I do on the outside of my home?  Regularly fix the edges of walkways and driveways and fix any cracks.  Remove anything that might make you trip as you walk through a door, such as a raised step or threshold.  Trim any bushes or trees on the path to your home.  Use bright outdoor lighting.  Clear any walking paths of anything that might make someone trip, such as rocks or tools.  Regularly check to see if handrails are loose or broken. Make sure that both sides of any steps have handrails.  Any raised decks and porches should have guardrails on the edges.  Have any leaves, snow, or ice cleared regularly.  Use sand or salt on walking paths during winter.  Clean up any spills in your garage right away. This includes oil or grease spills. What can I do in the bathroom?  Use night lights.  Install grab bars by the toilet and in the tub and shower. Do not use towel bars as grab bars.  Use non-skid mats or decals in the tub or shower.  If you need to sit down in the shower, use a plastic, non-slip stool.  Keep the floor dry. Clean up any water that spills on the floor as soon as it happens.  Remove soap buildup in the tub or shower regularly.  Attach bath mats securely with double-sided non-slip rug  tape.  Do not have throw rugs and other things on the floor that can make you trip. What can I do in the bedroom?  Use night lights.  Make sure that you have a light by your bed that is easy to reach.  Do not use any sheets or blankets that are too big for your bed. They should not hang down onto the floor.  Have a firm chair that has side arms. You can use this for support while you get dressed.  Do not have throw rugs and other things on the floor that can make you trip. What can I do in the kitchen?  Clean up any spills right away.  Avoid walking on wet floors.  Keep items that you use a lot in easy-to-reach places.  If you need to reach something above you, use a strong step stool that has a grab bar.  Keep electrical cords out of the way.  Do not use floor polish or wax that makes floors slippery. If you must use wax, use non-skid floor wax.  Do not have  throw rugs and other things on the floor that can make you trip. What can I do with my stairs?  Do not leave any items on the stairs.  Make sure that there are handrails on both sides of the stairs and use them. Fix handrails that are broken or loose. Make sure that handrails are as long as the stairways.  Check any carpeting to make sure that it is firmly attached to the stairs. Fix any carpet that is loose or worn.  Avoid having throw rugs at the top or bottom of the stairs. If you do have throw rugs, attach them to the floor with carpet tape.  Make sure that you have a light switch at the top of the stairs and the bottom of the stairs. If you do not have them, ask someone to add them for you. What else can I do to help prevent falls?  Wear shoes that:  Do not have high heels.  Have rubber bottoms.  Are comfortable and fit you well.  Are closed at the toe. Do not wear sandals.  If you use a stepladder:  Make sure that it is fully opened. Do not climb a closed stepladder.  Make sure that both sides of the  stepladder are locked into place.  Ask someone to hold it for you, if possible.  Clearly mark and make sure that you can see:  Any grab bars or handrails.  First and last steps.  Where the edge of each step is.  Use tools that help you move around (mobility aids) if they are needed. These include:  Canes.  Walkers.  Scooters.  Crutches.  Turn on the lights when you go into a dark area. Replace any light bulbs as soon as they burn out.  Set up your furniture so you have a clear path. Avoid moving your furniture around.  If any of your floors are uneven, fix them.  If there are any pets around you, be aware of where they are.  Review your medicines with your doctor. Some medicines can make you feel dizzy. This can increase your chance of falling. Ask your doctor what other things that you can do to help prevent falls. This information is not intended to replace advice given to you by your health care provider. Make sure you discuss any questions you have with your health care provider. Document Released: 08/11/2009 Document Revised: 03/22/2016 Document Reviewed: 11/19/2014 Elsevier Interactive Patient Education  2017 [employer].

## 2017-11-12 ENCOUNTER — Other Ambulatory Visit: Payer: Medicare Other

## 2017-11-12 DIAGNOSIS — E785 Hyperlipidemia, unspecified: Secondary | ICD-10-CM | POA: Diagnosis not present

## 2017-11-12 DIAGNOSIS — I10 Essential (primary) hypertension: Secondary | ICD-10-CM

## 2017-11-13 LAB — CBC WITH DIFFERENTIAL/PLATELET
Basophils Absolute: 30 cells/uL (ref 0–200)
Basophils Relative: 0.6 %
Eosinophils Absolute: 80 cells/uL (ref 15–500)
Eosinophils Relative: 1.6 %
HCT: 44.7 % (ref 38.5–50.0)
Hemoglobin: 15.3 g/dL (ref 13.2–17.1)
Lymphs Abs: 1145 cells/uL (ref 850–3900)
MCH: 30.2 pg (ref 27.0–33.0)
MCHC: 34.2 g/dL (ref 32.0–36.0)
MCV: 88.2 fL (ref 80.0–100.0)
MPV: 11.6 fL (ref 7.5–12.5)
Monocytes Relative: 7.8 %
Neutro Abs: 3355 cells/uL (ref 1500–7800)
Neutrophils Relative %: 67.1 %
Platelets: 149 10*3/uL (ref 140–400)
RBC: 5.07 10*6/uL (ref 4.20–5.80)
RDW: 12.2 % (ref 11.0–15.0)
Total Lymphocyte: 22.9 %
WBC mixed population: 390 cells/uL (ref 200–950)
WBC: 5 10*3/uL (ref 3.8–10.8)

## 2017-11-13 LAB — BASIC METABOLIC PANEL
BUN/Creatinine Ratio: 15 (calc) (ref 6–22)
BUN: 18 mg/dL (ref 7–25)
CO2: 31 mmol/L (ref 20–32)
Calcium: 9.8 mg/dL (ref 8.6–10.3)
Chloride: 105 mmol/L (ref 98–110)
Creat: 1.24 mg/dL — ABNORMAL HIGH (ref 0.70–1.18)
Glucose, Bld: 82 mg/dL (ref 65–99)
Potassium: 4 mmol/L (ref 3.5–5.3)
Sodium: 142 mmol/L (ref 135–146)

## 2017-11-13 LAB — LIPID PANEL
Cholesterol: 145 mg/dL (ref <200)
HDL: 52 mg/dL (ref >40)
LDL Cholesterol (Calc): 79 mg/dL (calc)
Non-HDL Cholesterol (Calc): 93 mg/dL (calc) (ref <130)
Total CHOL/HDL Ratio: 2.8 (calc) (ref <5.0)
Triglycerides: 62 mg/dL (ref <150)

## 2017-11-14 ENCOUNTER — Ambulatory Visit (INDEPENDENT_AMBULATORY_CARE_PROVIDER_SITE_OTHER): Payer: Medicare Other | Admitting: Internal Medicine

## 2017-11-14 ENCOUNTER — Encounter: Payer: Self-pay | Admitting: Internal Medicine

## 2017-11-14 VITALS — BP 138/80 | HR 58 | Temp 98.5°F | Wt 197.0 lb

## 2017-11-14 DIAGNOSIS — I10 Essential (primary) hypertension: Secondary | ICD-10-CM

## 2017-11-14 DIAGNOSIS — D696 Thrombocytopenia, unspecified: Secondary | ICD-10-CM | POA: Diagnosis not present

## 2017-11-14 DIAGNOSIS — R39198 Other difficulties with micturition: Secondary | ICD-10-CM | POA: Diagnosis not present

## 2017-11-14 DIAGNOSIS — Z7189 Other specified counseling: Secondary | ICD-10-CM | POA: Diagnosis not present

## 2017-11-14 DIAGNOSIS — R4701 Aphasia: Secondary | ICD-10-CM | POA: Diagnosis not present

## 2017-11-14 DIAGNOSIS — E785 Hyperlipidemia, unspecified: Secondary | ICD-10-CM | POA: Diagnosis not present

## 2017-11-14 MED ORDER — ZOSTER VAC RECOMB ADJUVANTED 50 MCG/0.5ML IM SUSR: 0.5000 mL | Freq: Once | INTRAMUSCULAR | 0 refills | Status: AC
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
## 2017-11-14 NOTE — Addendum Note (Signed)
Addended by: Despina Hidden on: 11/14/2017 10:04 AM   Modules accepted: Orders
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
## 2017-11-20 DIAGNOSIS — D485 Neoplasm of uncertain behavior of skin: Secondary | ICD-10-CM | POA: Diagnosis not present

## 2017-11-20 DIAGNOSIS — L814 Other melanin hyperpigmentation: Secondary | ICD-10-CM | POA: Diagnosis not present

## 2017-11-20 DIAGNOSIS — D1801 Hemangioma of skin and subcutaneous tissue: Secondary | ICD-10-CM | POA: Diagnosis not present

## 2017-11-20 DIAGNOSIS — L570 Actinic keratosis: Secondary | ICD-10-CM | POA: Diagnosis not present

## 2017-11-20 DIAGNOSIS — B351 Tinea unguium: Secondary | ICD-10-CM | POA: Diagnosis not present

## 2017-11-20 DIAGNOSIS — Z85820 Personal history of malignant melanoma of skin: Secondary | ICD-10-CM | POA: Diagnosis not present

## 2017-11-20 DIAGNOSIS — Z85828 Personal history of other malignant neoplasm of skin: Secondary | ICD-10-CM | POA: Diagnosis not present

## 2017-11-20 DIAGNOSIS — L821 Other seborrheic keratosis: Secondary | ICD-10-CM | POA: Diagnosis not present

## 2017-11-20 DIAGNOSIS — C44519 Basal cell carcinoma of skin of other part of trunk: Secondary | ICD-10-CM | POA: Diagnosis not present

## 2017-12-02 ENCOUNTER — Other Ambulatory Visit: Payer: Self-pay | Admitting: Internal Medicine

## 2017-12-02 DIAGNOSIS — Z85828 Personal history of other malignant neoplasm of skin: Secondary | ICD-10-CM | POA: Diagnosis not present

## 2017-12-02 DIAGNOSIS — C44519 Basal cell carcinoma of skin of other part of trunk: Secondary | ICD-10-CM | POA: Diagnosis not present

## 2017-12-02 DIAGNOSIS — L570 Actinic keratosis: Secondary | ICD-10-CM | POA: Diagnosis not present

## 2018-05-11 ENCOUNTER — Other Ambulatory Visit: Payer: Self-pay | Admitting: Internal Medicine

## 2018-05-13 ENCOUNTER — Other Ambulatory Visit: Payer: Medicare Other

## 2018-05-13 DIAGNOSIS — I10 Essential (primary) hypertension: Secondary | ICD-10-CM | POA: Diagnosis not present

## 2018-05-13 DIAGNOSIS — E785 Hyperlipidemia, unspecified: Secondary | ICD-10-CM

## 2018-05-13 DIAGNOSIS — D696 Thrombocytopenia, unspecified: Secondary | ICD-10-CM

## 2018-05-13 LAB — COMPLETE METABOLIC PANEL WITH GFR
AG Ratio: 2.1 (calc) (ref 1.0–2.5)
ALT: 19 U/L (ref 9–46)
AST: 18 U/L (ref 10–35)
Albumin: 4.1 g/dL (ref 3.6–5.1)
Alkaline phosphatase (APISO): 41 U/L (ref 40–115)
BUN: 15 mg/dL (ref 7–25)
CO2: 29 mmol/L (ref 20–32)
Calcium: 9.3 mg/dL (ref 8.6–10.3)
Chloride: 108 mmol/L (ref 98–110)
Creat: 1.14 mg/dL (ref 0.70–1.18)
GFR, Est African American: 75 mL/min/{1.73_m2} (ref >=60)
GFR, Est Non African American: 64 mL/min/{1.73_m2} (ref >=60)
Globulin: 2 g/dL (calc) (ref 1.9–3.7)
Glucose, Bld: 91 mg/dL (ref 65–99)
Potassium: 4.1 mmol/L (ref 3.5–5.3)
Sodium: 143 mmol/L (ref 135–146)
Total Bilirubin: 0.5 mg/dL (ref 0.2–1.2)
Total Protein: 6.1 g/dL (ref 6.1–8.1)

## 2018-05-13 LAB — CBC WITH DIFFERENTIAL/PLATELET
Basophils Absolute: 32 cells/uL (ref 0–200)
Basophils Relative: 0.7 %
Eosinophils Absolute: 108 cells/uL (ref 15–500)
Eosinophils Relative: 2.4 %
HCT: 44.9 % (ref 38.5–50.0)
Hemoglobin: 15.2 g/dL (ref 13.2–17.1)
Lymphs Abs: 801 cells/uL — ABNORMAL LOW (ref 850–3900)
MCH: 30.4 pg (ref 27.0–33.0)
MCHC: 33.9 g/dL (ref 32.0–36.0)
MCV: 89.8 fL (ref 80.0–100.0)
MPV: 11.5 fL (ref 7.5–12.5)
Monocytes Relative: 8.4 %
Neutro Abs: 3182 cells/uL (ref 1500–7800)
Neutrophils Relative %: 70.7 %
Platelets: 144 10*3/uL (ref 140–400)
RBC: 5 10*6/uL (ref 4.20–5.80)
RDW: 12.2 % (ref 11.0–15.0)
Total Lymphocyte: 17.8 %
WBC mixed population: 378 cells/uL (ref 200–950)
WBC: 4.5 10*3/uL (ref 3.8–10.8)

## 2018-05-13 LAB — LIPID PANEL
Cholesterol: 131 mg/dL (ref <200)
HDL: 59 mg/dL (ref >40)
LDL Cholesterol (Calc): 58 mg/dL (calc)
Non-HDL Cholesterol (Calc): 72 mg/dL (calc) (ref <130)
Total CHOL/HDL Ratio: 2.2 (calc) (ref <5.0)
Triglycerides: 48 mg/dL (ref <150)

## 2018-05-14 DIAGNOSIS — C4441 Basal cell carcinoma of skin of scalp and neck: Secondary | ICD-10-CM | POA: Diagnosis not present

## 2018-05-14 DIAGNOSIS — L720 Epidermal cyst: Secondary | ICD-10-CM | POA: Diagnosis not present

## 2018-05-14 DIAGNOSIS — D034 Melanoma in situ of scalp and neck: Secondary | ICD-10-CM | POA: Diagnosis not present

## 2018-05-14 DIAGNOSIS — D225 Melanocytic nevi of trunk: Secondary | ICD-10-CM | POA: Diagnosis not present

## 2018-05-14 DIAGNOSIS — L821 Other seborrheic keratosis: Secondary | ICD-10-CM | POA: Diagnosis not present

## 2018-05-14 DIAGNOSIS — B351 Tinea unguium: Secondary | ICD-10-CM | POA: Diagnosis not present

## 2018-05-14 DIAGNOSIS — L570 Actinic keratosis: Secondary | ICD-10-CM | POA: Diagnosis not present

## 2018-05-14 DIAGNOSIS — Z85828 Personal history of other malignant neoplasm of skin: Secondary | ICD-10-CM | POA: Diagnosis not present

## 2018-05-14 DIAGNOSIS — D1801 Hemangioma of skin and subcutaneous tissue: Secondary | ICD-10-CM | POA: Diagnosis not present

## 2018-05-15 ENCOUNTER — Ambulatory Visit (INDEPENDENT_AMBULATORY_CARE_PROVIDER_SITE_OTHER): Payer: Medicare Other | Admitting: Internal Medicine

## 2018-05-15 ENCOUNTER — Encounter: Payer: Self-pay | Admitting: Internal Medicine

## 2018-05-15 VITALS — BP 122/60 | HR 58 | Temp 98.0°F | Ht 73.0 in | Wt 191.0 lb

## 2018-05-15 DIAGNOSIS — D696 Thrombocytopenia, unspecified: Secondary | ICD-10-CM

## 2018-05-15 DIAGNOSIS — Z23 Encounter for immunization: Secondary | ICD-10-CM | POA: Diagnosis not present

## 2018-05-15 DIAGNOSIS — R4701 Aphasia: Secondary | ICD-10-CM | POA: Diagnosis not present

## 2018-05-15 DIAGNOSIS — E785 Hyperlipidemia, unspecified: Secondary | ICD-10-CM | POA: Diagnosis not present

## 2018-05-15 DIAGNOSIS — I10 Essential (primary) hypertension: Secondary | ICD-10-CM

## 2018-05-15 MED ORDER — TETANUS-DIPHTH-ACELL PERTUSSIS 5-2-15.5 LF-MCG/0.5 IM SUSP: 0.5000 mL | Freq: Once | INTRAMUSCULAR | 0 refills | Status: AC
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
## 2018-05-19 DIAGNOSIS — H5201 Hypermetropia, right eye: Secondary | ICD-10-CM | POA: Diagnosis not present

## 2018-05-19 DIAGNOSIS — H401411 Capsular glaucoma with pseudoexfoliation of lens, right eye, mild stage: Secondary | ICD-10-CM | POA: Diagnosis not present

## 2018-05-19 DIAGNOSIS — Z961 Presence of intraocular lens: Secondary | ICD-10-CM | POA: Diagnosis not present

## 2018-06-02 DIAGNOSIS — D044 Carcinoma in situ of skin of scalp and neck: Secondary | ICD-10-CM | POA: Diagnosis not present

## 2018-06-02 DIAGNOSIS — D034 Melanoma in situ of scalp and neck: Secondary | ICD-10-CM | POA: Diagnosis not present

## 2018-06-02 DIAGNOSIS — Z85828 Personal history of other malignant neoplasm of skin: Secondary | ICD-10-CM | POA: Diagnosis not present

## 2018-06-03 DIAGNOSIS — D034 Melanoma in situ of scalp and neck: Secondary | ICD-10-CM | POA: Diagnosis not present

## 2018-06-10 DIAGNOSIS — Z4802 Encounter for removal of sutures: Secondary | ICD-10-CM | POA: Diagnosis not present

## 2018-09-22 ENCOUNTER — Encounter: Payer: Self-pay | Admitting: Internal Medicine

## 2018-10-31 ENCOUNTER — Ambulatory Visit: Payer: Self-pay

## 2018-11-13 ENCOUNTER — Ambulatory Visit: Payer: Medicare Other | Admitting: Internal Medicine

## 2018-11-17 ENCOUNTER — Encounter: Payer: Self-pay | Admitting: Family

## 2018-11-17 ENCOUNTER — Ambulatory Visit (INDEPENDENT_AMBULATORY_CARE_PROVIDER_SITE_OTHER): Payer: Medicare Other | Admitting: Internal Medicine

## 2018-11-17 ENCOUNTER — Ambulatory Visit (INDEPENDENT_AMBULATORY_CARE_PROVIDER_SITE_OTHER): Payer: Medicare Other | Admitting: Family

## 2018-11-17 ENCOUNTER — Encounter: Payer: Self-pay | Admitting: Internal Medicine

## 2018-11-17 ENCOUNTER — Ambulatory Visit: Payer: Medicare Other

## 2018-11-17 VITALS — BP 112/74 | HR 59 | Temp 98.2°F | Resp 18 | Ht 73.0 in | Wt 196.2 lb

## 2018-11-17 VITALS — BP 112/74 | HR 59 | Temp 98.2°F | Ht 73.0 in | Wt 196.0 lb

## 2018-11-17 DIAGNOSIS — Z1159 Encounter for screening for other viral diseases: Secondary | ICD-10-CM

## 2018-11-17 DIAGNOSIS — R4701 Aphasia: Secondary | ICD-10-CM | POA: Diagnosis not present

## 2018-11-17 DIAGNOSIS — E785 Hyperlipidemia, unspecified: Secondary | ICD-10-CM | POA: Diagnosis not present

## 2018-11-17 DIAGNOSIS — I10 Essential (primary) hypertension: Secondary | ICD-10-CM | POA: Diagnosis not present

## 2018-11-17 DIAGNOSIS — Z1211 Encounter for screening for malignant neoplasm of colon: Secondary | ICD-10-CM | POA: Diagnosis not present

## 2018-11-17 DIAGNOSIS — D696 Thrombocytopenia, unspecified: Secondary | ICD-10-CM

## 2018-11-17 DIAGNOSIS — Z0000 Encounter for general adult medical examination without abnormal findings: Secondary | ICD-10-CM

## 2018-11-17 NOTE — Progress Notes (Signed)
Location:  [facility] clinic Provider:  Noach Calvillo L. Mariea Clonts, D.O., C.M.D.  Goals of Care:  Advanced Directives 11/17/2018  Does Patient Have a Medical Advance Directive? Yes  Type of Paramedic of [place];Living will;Out of facility DNR (pink MOST or yellow form)  Does patient want to make changes to medical advance directive? No - Patient declined  Copy of [place] in Chart? Yes - validated most recent copy scanned in chart (See row information)  Pre-existing out of facility DNR order (yellow form or pink MOST form) Yellow form placed in chart (order not valid for inpatient use)   Chief Complaint  Patient presents with  . Medical Management of Chronic Issues    65mth follow-up    HPI: Patient is a 73 y.o. male seen today for medical management of chronic diseases.    29/30 and missed the date today.    He is planning another bicycle trip in June.   It helps keep his pant size down.   He did ride two days ago.  No changes with his word-finding.  Pleas Koch and others are feeling like he's not hearing so well.  They note he talks loudly.  Recommended an audiology eval just in case.   Continues to also be active in the yard.  May ache more afterward and longer than in years past, but nothing persistent.    He and Pleas Koch are looking at [facility].  Educated about how Dr. Lyndel Safe and [facility] cover them there.    He has an upcoming derm appt about his scalp lesions.  Prior melanoma.    He is due for colonoscopy (was actually due 9/19).  He's had some polyps removed in [place].  He's on a 5 year call back.  Old cscope is in media.     Past Medical History:  Diagnosis Date  . Cervicalgia 03/29/2015  . History of colonic polyps   . Hyperlipidemia   . Hypertension   . Thrombocytopenia ([place])   . Vitamin D deficiency     Past Surgical History:  Procedure Laterality Date  . ARM SKIN LESION BIOPSY / EXCISION Left 02/2016   atypical lentigmous and  nested melanocytic proliferation--Dr. Harriett Sine  . CATARACT EXTRACTION Bilateral january and february 2018    Allergies  Allergen Reactions  . Zocor [Simvastatin] Other (See Comments)    Memory loss  . Niaspan [Niacin Er]     Outpatient Encounter Medications as of 11/17/2018  Medication Sig  . aspirin EC 81 MG tablet Take 81 mg by mouth daily.  . Cholecalciferol (VITAMIN D3) 1000 units CAPS Take by mouth daily.  . fenofibrate 160 MG tablet TAKE 1 TABLET BY MOUTH  DAILY  . hydrochlorothiazide (HYDRODIURIL) 25 MG tablet TAKE 1 TABLET BY MOUTH  DAILY  . lisinopril (PRINIVIL,ZESTRIL) 40 MG tablet TAKE 1 TABLET BY MOUTH  DAILY  . vitamin B-12 (CYANOCOBALAMIN) 1000 MCG tablet Take 1,000 mcg by mouth daily.   No facility-administered encounter medications on file as of 11/17/2018.     Review of Systems:  Review of Systems  Constitutional: Negative for chills and fever.  HENT: Negative for congestion and hearing loss.   Eyes: Negative for blurred vision.  Respiratory: Negative for cough and shortness of breath.   Cardiovascular: Negative for chest pain, palpitations and leg swelling.  Gastrointestinal: Negative for blood in stool, constipation, melena and nausea.  Genitourinary: Negative for dysuria.  Musculoskeletal: Negative for back pain, falls and joint pain.  Skin: Negative  for itching and rash.  Neurological: Negative for dizziness and seizures.       Aphasia persists  Endo/Heme/Allergies: Does not bruise/bleed easily.  Psychiatric/Behavioral: Negative for depression and memory loss. The patient is not nervous/anxious and does not have insomnia.     Health Maintenance  Topic Date Due  . COLONOSCOPY  07/14/2018  . TETANUS/TDAP  05/27/2028  . INFLUENZA VACCINE  Completed  . Hepatitis C Screening  Completed  . PNA vac Low Risk Adult  Completed    Physical Exam: Vitals:   11/17/18 1617  BP: 112/74  Pulse: (!) 59  Temp: 98.2 F (36.8 C)  TempSrc: Oral  SpO2: 96%   Weight: 196 lb (88.9 kg)  Height: 6\' 1"  (1.854 m)   Body mass index is 25.86 kg/m. Physical Exam Vitals signs reviewed.  Constitutional:      Appearance: Normal appearance.  HENT:     Head: Normocephalic and atraumatic.  Eyes:     Comments: glasses  Neck:     Musculoskeletal: Neck supple.  Cardiovascular:     Rate and Rhythm: Normal rate and regular rhythm.     Pulses: Normal pulses.     Heart sounds: Normal heart sounds.  Pulmonary:     Effort: Pulmonary effort is normal.     Breath sounds: Normal breath sounds.  Abdominal:     General: Bowel sounds are normal.  Musculoskeletal: Normal range of motion.        General: No swelling or tenderness.  Skin:    General: Skin is warm and dry.     Capillary Refill: Capillary refill takes less than 2 seconds.  Neurological:     Mental Status: He is alert.     Labs reviewed: Basic Metabolic Panel: Recent Labs    05/13/18 0812  NA 143  K 4.1  CL 108  CO2 29  GLUCOSE 91  BUN 15  CREATININE 1.14  CALCIUM 9.3   Liver Function Tests: Recent Labs    05/13/18 0812  AST 18  ALT 19  BILITOT 0.5  PROT 6.1   No results for input(s): LIPASE, AMYLASE in the last 8760 hours. No results for input(s): AMMONIA in the last 8760 hours. CBC: Recent Labs    05/13/18 0812  WBC 4.5  NEUTROABS 3,182  HGB 15.2  HCT 44.9  MCV 89.8  PLT 144   Lipid Panel: Recent Labs    05/13/18 0812  CHOL 131  HDL 59  LDLCALC 58  TRIG 48  CHOLHDL 2.2   No results found for: HGBA1C  Procedures since last visit: No results found.  Assessment/Plan 1. Aphasia -expressive--ongoing, but does not seem any worse to me in the last 6 mos  2. Thrombocytopenia ([place]) -cont to monitor, has fluctuated, but never in worrisome range - CBC with Differential/Platelet; Future  3. Hyperlipidemia, unspecified hyperlipidemia type - cont exercise, fenofibrate and avoid fatty foods except as treats  - Lipid panel; Future  4. Essential  hypertension -bp at goal with current regimen and diet and exercise - CBC with Differential/Platelet; Future - COMPLETE METABOLIC PANEL WITH GFR; Future  5. Encounter for hepatitis C screening test for low risk patient - Hepatitis C antibody; Future  Labs/tests ordered:   Orders Placed This Encounter  Procedures  . CBC with Differential/Platelet    Standing Status:   Future    Standing Expiration Date:   11/18/2019  . COMPLETE METABOLIC PANEL WITH GFR    Standing Status:   Future    Standing  Expiration Date:   11/18/2019  . Lipid panel    Standing Status:   Future    Standing Expiration Date:   11/18/2019  . Hepatitis C antibody    Standing Status:   Future    Standing Expiration Date:   11/18/2019   Next appt:  6 mos EV   Rose-Marie Hickling L. Sherese Heyward, D.O. [facility] [address],  [address] Cell Phone (Mon-Fri 8am-5pm):  [phone] On Call:  [phone] & follow prompts after 5pm & weekends Office Phone:  [phone] Office Fax:  [phone]

## 2018-11-17 NOTE — Progress Notes (Signed)
Subjective:   Damiel Barthold is a 73 y.o. male who presents for Medicare Annual/Subsequent preventive examination.  Review of Systems:   Objective:    Vitals: BP 112/74   Pulse (!) 59   Temp 98.2 F (36.8 C) (Oral)   Ht 6\' 1"  (1.854 m)   Wt 196 lb 3.2 oz (89 kg)   SpO2 96%   BMI 25.89 kg/m   Body mass index is 25.89 kg/m.  Advanced Directives 11/17/2018 05/15/2018 11/14/2017 10/28/2017 01/29/2017 10/25/2016 04/06/2016  Does Patient Have a Medical Advance Directive? Yes Yes Yes Yes Yes Yes Yes  Type of Paramedic of [place];Living will;Out of facility DNR (pink MOST or yellow form) [place];Living will;Out of facility DNR (pink MOST or yellow form) [place];Living will [place];Living will [place];Living will Healthcare Power of [place]  Does patient want to make changes to medical advance directive? No - Patient declined No - Patient declined No - Patient declined No - Patient declined - - -  Copy of [place] in Chart? Yes - validated most recent copy scanned in chart (See row information) Yes Yes Yes - Yes Yes  Pre-existing out of facility DNR order (yellow form or pink MOST form) Yellow form placed in chart (order not valid for inpatient use) Yellow form placed in chart (order not valid for inpatient use) - - - - -    Tobacco Social History   Tobacco Use  Smoking Status Never Smoker  Smokeless Tobacco Never Used     Counseling given: Not Answered   Clinical Intake:   Past Medical History:  Diagnosis Date  . Cervicalgia 03/29/2015  . History of colonic polyps   . Hyperlipidemia   . Hypertension   . Thrombocytopenia ([place])   . Vitamin D deficiency    Past Surgical History:  Procedure Laterality Date  . ARM SKIN LESION BIOPSY / EXCISION Left 02/2016   atypical lentigmous and nested melanocytic proliferation--Dr. Harriett Sine  . CATARACT EXTRACTION Bilateral january and february 2018   Family History  Problem Relation Age of Onset  . Clotting disorder Father   . Suicidality Father   . High Cholesterol Mother   . Renal Disease Mother   . Stroke Mother 66   Social History   Socioeconomic History  . Marital status: Married    Spouse name: Not on file  . Number of children: Not on file  . Years of education: Not on file  . Highest education level: Not on file  Occupational History  . Not on file  Social Needs  . Financial resource strain: Not hard at all  . Food insecurity:    Worry: Never true    Inability: Never true  . Transportation needs:    Medical: No    Non-medical: No  Tobacco Use  . Smoking status: Never Smoker  . Smokeless tobacco: Never Used  Substance and Sexual Activity  . Alcohol use: Yes    Alcohol/week: 0.0 standard drinks    Comment: once a week  . Drug use: No  . Sexual activity: Not Currently  Lifestyle  . Physical activity:    Days per week: 2 days    Minutes per session: 30 min  . Stress: Only a little  Relationships  . Social connections:    Talks on phone: Once a week    Gets together: More than three times a week  Attends religious service: Never    Active member of club or organization: No    Attends meetings of clubs or organizations: Never    Relationship status: Married  Other Topics Concern  . Not on file  Social History Narrative   Diet- well balanced   Caffeine- Yes (coffee)   Married- Yes, [place], 1 story with 2 persons   Pets- 1 cat   Current/Past profession- Gaffer   Exercise- Yes ( walking and aerobics)   Living Will- Yes   DNR- N/A   POA/HPOA- N/A       Outpatient Encounter Medications as of 11/17/2018  Medication Sig  . aspirin EC 81 MG tablet Take 81 mg by mouth daily.  . Cholecalciferol (VITAMIN D3) 1000 units CAPS Take by mouth daily.  . fenofibrate 160 MG tablet TAKE 1 TABLET BY MOUTH  DAILY  .  hydrochlorothiazide (HYDRODIURIL) 25 MG tablet TAKE 1 TABLET BY MOUTH  DAILY  . lisinopril (PRINIVIL,ZESTRIL) 40 MG tablet TAKE 1 TABLET BY MOUTH  DAILY  . vitamin B-12 (CYANOCOBALAMIN) 1000 MCG tablet Take 1,000 mcg by mouth daily.   No facility-administered encounter medications on file as of 11/17/2018.     Activities of Daily Living No flowsheet data found.  Patient Care Team: Gayland Curry, DO as PCP - General (Geriatric Medicine)   Assessment:   This is a routine wellness examination for [place].  Exercise Activities and Dietary recommendations    Goals    . <enter goal here>     Starting 10/25/16, I will maintain my current exercise lifestyle of biking 50 miles per week.     . Patient Stated     He wants to bike more throughout the week to do a longer biking trip eventually.       Fall Risk Fall Risk  11/17/2018 05/15/2018 11/14/2017 10/28/2017 05/16/2017  Falls in the past year? 0 No No Yes No  Number falls in past yr: 0 - - 2 or more -  Comment - - - - -  Injury with Fall? 0 - - No -  Comment - - - - -  Follow up - - - - -   Is the patient's home free of loose throw rugs in walkways, pet beds, electrical cords, etc?   yes      Grab bars in the bathroom? no      Handrails on the stairs?   yes      Adequate lighting?   yes    Depression Screen PHQ 2/9 Scores 11/17/2018 05/15/2018 11/14/2017 10/28/2017  PHQ - 2 Score 0 0 0 1    Cognitive Function MMSE - Mini Mental State Exam 11/17/2018 10/28/2017 10/25/2016 03/21/2015  Orientation to time 5 5 5 5   Orientation to Place 4 5 5 5   Registration 3 3 3 3   Attention/ Calculation 5 5 5 5   Recall 3 3 3 3   Language- name 2 objects 2 2 2 2   Language- repeat 1 1 1 1   Language- follow 3 step command 3 3 2 3   Language- read & follow direction 1 1 1 1   Write a sentence 1 1 1 1   Copy design 1 1 1 1   Total score 29 30 29 30         Immunization History  Administered Date(s) Administered  . Influenza,inj,Quad PF,6+ Mos  07/25/2015, 08/03/2016, 07/29/2018  . Influenza-Unspecified 08/29/2017  . Pneumococcal Conjugate-13 03/21/2015  . Pneumococcal Polysaccharide-23 04/06/2016  . Tdap  10/30/2007, 05/27/2018  . Zoster 10/29/2012  . Zoster Recombinat (Shingrix) 03/08/2018, 05/27/2018    Qualifies for Shingles Vaccine? Up to date  Screening Tests Health Maintenance  Topic Date Due  . COLONOSCOPY  07/14/2018  . TETANUS/TDAP  05/27/2028  . INFLUENZA VACCINE  Completed  . Hepatitis C Screening  Completed  . PNA vac Low Risk Adult  Completed   Cancer Screenings: Lung: Low Dose CT Chest recommended if Age 70-80 years, 30 pack-year currently smoking OR have quit w/in 15years. Patient doe not qualify. Colorectal: Reschedule for July 2020   Additional Screenings: Hepatitis C Screening:       Plan:  - Schedule colonoscopy for July 2020 - Hep C screening    I have personally reviewed and noted the following in the patient's chart:   . Medical and social history . Use of alcohol, tobacco or illicit drugs  . Current medications and supplements . Functional ability and status . Nutritional status . Physical activity . Advanced directives . List of other physicians . Hospitalizations, surgeries, and ER visits in previous 12 months . Vitals . Screenings to include cognitive, depression, and falls . Referrals and appointments  In addition, I have reviewed and discussed with patient certain preventive protocols, quality metrics, and best practice recommendations. A written personalized care plan for preventive services as well as general preventive health recommendations were provided to patient.     Sandrea Hughs, NP  11/17/2018

## 2018-11-17 NOTE — Patient Instructions (Signed)
Brandon [name] , Thank you for taking time to come for your Medicare Wellness Visit. I appreciate your ongoing commitment to your health goals. Please review the following plan we discussed and let me know if I can assist you in the future.   Screening recommendations/referrals: Colonoscopy: schedule for July 2020 Recommended yearly ophthalmology/optometry visit for glaucoma screening and checkup Recommended yearly dental visit for hygiene and checkup  Vaccinations: Influenza vaccine: up to date  Pneumococcal vaccine up to date  Tdap vaccine up to date  Shingles vaccine : up to date    Advanced directives: Copy in chart   Conditions/risks identified: hypertension ,Hyperlipidemia low carbohydrate,low saturated fats and high vegetable diet and exercise discussed   Next appointment: 1 year   Preventive Care 72 Years and Older, Male Preventive care refers to lifestyle choices and visits with your health care provider that can promote health and wellness. What does preventive care include?  A yearly physical exam. This is also called an annual well check.  Dental exams once or twice a year.  Routine eye exams. Ask your health care provider how often you should have your eyes checked.  Personal lifestyle choices, including:  Daily care of your teeth and gums.  Regular physical activity.  Eating a healthy diet.  Avoiding tobacco and drug use.  Limiting alcohol use.  Practicing safe sex.  Taking low doses of aspirin every day.  Taking vitamin and mineral supplements as recommended by your health care provider. What happens during an annual well check? The services and screenings done by your health care provider during your annual well check will depend on your age, overall health, lifestyle risk factors, and family history of disease. Counseling  Your health care provider may ask you questions about your:  Alcohol use.  Tobacco use.  Drug use.  Emotional  well-being.  Home and relationship well-being.  Sexual activity.  Eating habits.  History of falls.  Memory and ability to understand (cognition).  Work and work Statistician. Screening  You may have the following tests or measurements:  Height, weight, and BMI.  Blood pressure.  Lipid and cholesterol levels. These may be checked every 5 years, or more frequently if you are over 100 years old.  Skin check.  Lung cancer screening. You may have this screening every year starting at age 72 if you have a 30-pack-year history of smoking and currently smoke or have quit within the past 15 years.  Fecal occult blood test (FOBT) of the stool. You may have this test every year starting at age 72.  Flexible sigmoidoscopy or colonoscopy. You may have a sigmoidoscopy every 5 years or a colonoscopy every 10 years starting at age 72.  Prostate cancer screening. Recommendations will vary depending on your family history and other risks.  Hepatitis C blood test.  Hepatitis B blood test.  Sexually transmitted disease (STD) testing.  Diabetes screening. This is done by checking your blood sugar (glucose) after you have not eaten for a while (fasting). You may have this done every 1-3 years.  Abdominal aortic aneurysm (AAA) screening. You may need this if you are a current or former smoker.  Osteoporosis. You may be screened starting at age 72 if you are at high risk. Talk with your health care provider about your test results, treatment options, and if necessary, the need for more tests. Vaccines  Your health care provider may recommend certain vaccines, such as:  Influenza vaccine. This is recommended every year.  Tetanus, diphtheria,  and acellular pertussis (Tdap, Td) vaccine. You may need a Td booster every 10 years.  Zoster vaccine. You may need this after age 50.  Pneumococcal 13-valent conjugate (PCV13) vaccine. One dose is recommended after age 14.  Pneumococcal  polysaccharide (PPSV23) vaccine. One dose is recommended after age 1. Talk to your health care provider about which screenings and vaccines you need and how often you need them. This information is not intended to replace advice given to you by your health care provider. Make sure you discuss any questions you have with your health care provider. Document Released: 11/11/2015 Document Revised: 07/04/2016 Document Reviewed: 08/16/2015 Elsevier Interactive Patient Education  2017 [place] Prevention in the Home Falls can cause injuries. They can happen to people of all ages. There are many things you can do to make your home safe and to help prevent falls. What can I do on the outside of my home?  Regularly fix the edges of walkways and driveways and fix any cracks.  Remove anything that might make you trip as you walk through a door, such as a raised step or threshold.  Trim any bushes or trees on the path to your home.  Use bright outdoor lighting.  Clear any walking paths of anything that might make someone trip, such as rocks or tools.  Regularly check to see if handrails are loose or broken. Make sure that both sides of any steps have handrails.  Any raised decks and porches should have guardrails on the edges.  Have any leaves, snow, or ice cleared regularly.  Use sand or salt on walking paths during winter.  Clean up any spills in your garage right away. This includes oil or grease spills. What can I do in the bathroom?  Use night lights.  Install grab bars by the toilet and in the tub and shower. Do not use towel bars as grab bars.  Use non-skid mats or decals in the tub or shower.  If you need to sit down in the shower, use a plastic, non-slip stool.  Keep the floor dry. Clean up any water that spills on the floor as soon as it happens.  Remove soap buildup in the tub or shower regularly.  Attach bath mats securely with double-sided non-slip rug  tape.  Do not have throw rugs and other things on the floor that can make you trip. What can I do in the bedroom?  Use night lights.  Make sure that you have a light by your bed that is easy to reach.  Do not use any sheets or blankets that are too big for your bed. They should not hang down onto the floor.  Have a firm chair that has side arms. You can use this for support while you get dressed.  Do not have throw rugs and other things on the floor that can make you trip. What can I do in the kitchen?  Clean up any spills right away.  Avoid walking on wet floors.  Keep items that you use a lot in easy-to-reach places.  If you need to reach something above you, use a strong step stool that has a grab bar.  Keep electrical cords out of the way.  Do not use floor polish or wax that makes floors slippery. If you must use wax, use non-skid floor wax.  Do not have throw rugs and other things on the floor that can make you trip. What can I do with my  stairs?  Do not leave any items on the stairs.  Make sure that there are handrails on both sides of the stairs and use them. Fix handrails that are broken or loose. Make sure that handrails are as long as the stairways.  Check any carpeting to make sure that it is firmly attached to the stairs. Fix any carpet that is loose or worn.  Avoid having throw rugs at the top or bottom of the stairs. If you do have throw rugs, attach them to the floor with carpet tape.  Make sure that you have a light switch at the top of the stairs and the bottom of the stairs. If you do not have them, ask someone to add them for you. What else can I do to help prevent falls?  Wear shoes that:  Do not have high heels.  Have rubber bottoms.  Are comfortable and fit you well.  Are closed at the toe. Do not wear sandals.  If you use a stepladder:  Make sure that it is fully opened. Do not climb a closed stepladder.  Make sure that both sides of the  stepladder are locked into place.  Ask someone to hold it for you, if possible.  Clearly mark and make sure that you can see:  Any grab bars or handrails.  First and last steps.  Where the edge of each step is.  Use tools that help you move around (mobility aids) if they are needed. These include:  Canes.  Walkers.  Scooters.  Crutches.  Turn on the lights when you go into a dark area. Replace any light bulbs as soon as they burn out.  Set up your furniture so you have a clear path. Avoid moving your furniture around.  If any of your floors are uneven, fix them.  If there are any pets around you, be aware of where they are.  Review your medicines with your doctor. Some medicines can make you feel dizzy. This can increase your chance of falling. Ask your doctor what other things that you can do to help prevent falls. This information is not intended to replace advice given to you by your health care provider. Make sure you discuss any questions you have with your health care provider. Document Released: 08/11/2009 Document Revised: 03/22/2016 Document Reviewed: 11/19/2014 Elsevier Interactive Patient Education  2017 [employer].

## 2018-11-19 ENCOUNTER — Telehealth: Payer: Self-pay | Admitting: Gastroenterology

## 2018-11-19 NOTE — Telephone Encounter (Signed)
Printed records from chart from 32yrs ago colon placed on dod desk Dr.Mansouraty for review.

## 2018-11-20 ENCOUNTER — Ambulatory Visit: Payer: Medicare Other | Admitting: Internal Medicine

## 2018-11-20 DIAGNOSIS — D045 Carcinoma in situ of skin of trunk: Secondary | ICD-10-CM | POA: Diagnosis not present

## 2018-11-20 DIAGNOSIS — C44619 Basal cell carcinoma of skin of left upper limb, including shoulder: Secondary | ICD-10-CM | POA: Diagnosis not present

## 2018-11-20 DIAGNOSIS — Z85828 Personal history of other malignant neoplasm of skin: Secondary | ICD-10-CM | POA: Diagnosis not present

## 2018-11-20 DIAGNOSIS — L570 Actinic keratosis: Secondary | ICD-10-CM | POA: Diagnosis not present

## 2018-11-20 DIAGNOSIS — Z85820 Personal history of malignant melanoma of skin: Secondary | ICD-10-CM | POA: Diagnosis not present

## 2018-11-20 DIAGNOSIS — L814 Other melanin hyperpigmentation: Secondary | ICD-10-CM | POA: Diagnosis not present

## 2018-11-20 DIAGNOSIS — L821 Other seborrheic keratosis: Secondary | ICD-10-CM | POA: Diagnosis not present

## 2018-11-28 DIAGNOSIS — Z85828 Personal history of other malignant neoplasm of skin: Secondary | ICD-10-CM | POA: Diagnosis not present

## 2018-11-28 DIAGNOSIS — L570 Actinic keratosis: Secondary | ICD-10-CM | POA: Diagnosis not present

## 2018-11-28 DIAGNOSIS — C44619 Basal cell carcinoma of skin of left upper limb, including shoulder: Secondary | ICD-10-CM | POA: Diagnosis not present

## 2018-11-28 DIAGNOSIS — D045 Carcinoma in situ of skin of trunk: Secondary | ICD-10-CM | POA: Diagnosis not present

## 2018-12-18 ENCOUNTER — Other Ambulatory Visit: Payer: Self-pay | Admitting: Internal Medicine

## 2019-04-27 ENCOUNTER — Encounter: Payer: Self-pay | Admitting: Family

## 2019-04-27 ENCOUNTER — Ambulatory Visit (INDEPENDENT_AMBULATORY_CARE_PROVIDER_SITE_OTHER): Payer: Medicare Other | Admitting: Family

## 2019-04-27 ENCOUNTER — Other Ambulatory Visit: Payer: Self-pay

## 2019-04-27 VITALS — BP 118/70 | HR 65 | Temp 97.7°F | Ht 73.0 in | Wt 192.6 lb

## 2019-04-27 DIAGNOSIS — L03011 Cellulitis of right finger: Secondary | ICD-10-CM | POA: Diagnosis not present

## 2019-04-27 MED ORDER — SACCHAROMYCES BOULARDII 250 MG PO CAPS: 250.0000 mg | ORAL_CAPSULE | Freq: Two times a day (BID) | ORAL | 0 refills | Status: AC

## 2019-04-27 MED ORDER — CEPHALEXIN 500 MG PO CAPS: 500.0000 mg | ORAL_CAPSULE | Freq: Two times a day (BID) | ORAL | 0 refills | Status: AC

## 2019-04-27 NOTE — Patient Instructions (Addendum)
1. Apply ice pack for to right middle finger for 5-10 minutes twice daily and soak finger in epsom salt daily to reduce swelling and inflammation.  2. Take Keflex 500 mg capsule one by mouth twice daily x 7 days for right middle finger infection.    3. May take Tylenol 500 mg tablet one by mouth every 6 hours as needed for pain or fever.  4. Notify provider's office if symptoms worsen or not relieved with antibiotics   5 .Take Florastor 250 mg capsule one by mouth twice daily x 10 days  OR over the counter probiotic to prevent antibiotic associated diarrhea.
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
## 2019-05-15 ENCOUNTER — Other Ambulatory Visit: Payer: Self-pay

## 2019-05-15 ENCOUNTER — Other Ambulatory Visit: Payer: Medicare Other

## 2019-05-15 DIAGNOSIS — Z1159 Encounter for screening for other viral diseases: Secondary | ICD-10-CM

## 2019-05-15 DIAGNOSIS — E785 Hyperlipidemia, unspecified: Secondary | ICD-10-CM

## 2019-05-15 DIAGNOSIS — I10 Essential (primary) hypertension: Secondary | ICD-10-CM | POA: Diagnosis not present

## 2019-05-15 DIAGNOSIS — D696 Thrombocytopenia, unspecified: Secondary | ICD-10-CM

## 2019-05-16 ENCOUNTER — Other Ambulatory Visit: Payer: Self-pay | Admitting: Internal Medicine

## 2019-05-18 LAB — CBC WITH DIFFERENTIAL/PLATELET
Absolute Monocytes: 367 cells/uL (ref 200–950)
Basophils Absolute: 31 cells/uL (ref 0–200)
Basophils Relative: 0.6 %
Eosinophils Absolute: 97 cells/uL (ref 15–500)
Eosinophils Relative: 1.9 %
HCT: 45.5 % (ref 38.5–50.0)
Hemoglobin: 15.4 g/dL (ref 13.2–17.1)
Lymphs Abs: 1132 cells/uL (ref 850–3900)
MCH: 31.2 pg (ref 27.0–33.0)
MCHC: 33.8 g/dL (ref 32.0–36.0)
MCV: 92.3 fL (ref 80.0–100.0)
MPV: 11.5 fL (ref 7.5–12.5)
Monocytes Relative: 7.2 %
Neutro Abs: 3473 cells/uL (ref 1500–7800)
Neutrophils Relative %: 68.1 %
Platelets: 167 10*3/uL (ref 140–400)
RBC: 4.93 10*6/uL (ref 4.20–5.80)
RDW: 12.6 % (ref 11.0–15.0)
Total Lymphocyte: 22.2 %
WBC: 5.1 10*3/uL (ref 3.8–10.8)

## 2019-05-18 LAB — HEPATITIS C ANTIBODY
Hepatitis C Ab: NONREACTIVE
SIGNAL TO CUT-OFF: 0 (ref <1.00)

## 2019-05-18 LAB — COMPLETE METABOLIC PANEL WITH GFR
AG Ratio: 1.7 (calc) (ref 1.0–2.5)
ALT: 17 U/L (ref 9–46)
AST: 18 U/L (ref 10–35)
Albumin: 4.1 g/dL (ref 3.6–5.1)
Alkaline phosphatase (APISO): 39 U/L (ref 35–144)
BUN/Creatinine Ratio: 22 (calc) (ref 6–22)
BUN: 26 mg/dL — ABNORMAL HIGH (ref 7–25)
CO2: 29 mmol/L (ref 20–32)
Calcium: 9.3 mg/dL (ref 8.6–10.3)
Chloride: 105 mmol/L (ref 98–110)
Creat: 1.2 mg/dL — ABNORMAL HIGH (ref 0.70–1.18)
GFR, Est African American: 70 mL/min/{1.73_m2} (ref >=60)
GFR, Est Non African American: 60 mL/min/{1.73_m2} (ref >=60)
Globulin: 2.4 g/dL (calc) (ref 1.9–3.7)
Glucose, Bld: 93 mg/dL (ref 65–99)
Potassium: 4.1 mmol/L (ref 3.5–5.3)
Sodium: 140 mmol/L (ref 135–146)
Total Bilirubin: 0.6 mg/dL (ref 0.2–1.2)
Total Protein: 6.5 g/dL (ref 6.1–8.1)

## 2019-05-18 LAB — LIPID PANEL
Cholesterol: 151 mg/dL (ref <200)
HDL: 56 mg/dL (ref >=40)
LDL Cholesterol (Calc): 79 mg/dL (calc)
Non-HDL Cholesterol (Calc): 95 mg/dL (calc) (ref <130)
Total CHOL/HDL Ratio: 2.7 (calc) (ref <5.0)
Triglycerides: 76 mg/dL (ref <150)

## 2019-05-21 ENCOUNTER — Telehealth: Payer: Self-pay | Admitting: Gastroenterology

## 2019-05-21 ENCOUNTER — Ambulatory Visit (INDEPENDENT_AMBULATORY_CARE_PROVIDER_SITE_OTHER): Payer: Medicare Other | Admitting: Internal Medicine

## 2019-05-21 ENCOUNTER — Encounter: Payer: Self-pay | Admitting: Internal Medicine

## 2019-05-21 ENCOUNTER — Other Ambulatory Visit: Payer: Self-pay

## 2019-05-21 ENCOUNTER — Encounter: Payer: Self-pay | Admitting: Gastroenterology

## 2019-05-21 VITALS — BP 132/72 | HR 57 | Temp 98.1°F | Ht 73.0 in | Wt 190.0 lb

## 2019-05-21 DIAGNOSIS — D696 Thrombocytopenia, unspecified: Secondary | ICD-10-CM | POA: Diagnosis not present

## 2019-05-21 DIAGNOSIS — M542 Cervicalgia: Secondary | ICD-10-CM | POA: Diagnosis not present

## 2019-05-21 DIAGNOSIS — Z86010 Personal history of colonic polyps: Secondary | ICD-10-CM

## 2019-05-21 DIAGNOSIS — I10 Essential (primary) hypertension: Secondary | ICD-10-CM | POA: Diagnosis not present

## 2019-05-21 DIAGNOSIS — E785 Hyperlipidemia, unspecified: Secondary | ICD-10-CM

## 2019-05-21 DIAGNOSIS — L03011 Cellulitis of right finger: Secondary | ICD-10-CM | POA: Diagnosis not present

## 2019-05-21 NOTE — Telephone Encounter (Signed)
Records reviewed. Previously identified as a patient with history of colon polyps.  Due for 5-year follow up.  Based on findings at time of next procedure will discuss timing of repeat based on new guidelines. GM
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
## 2019-05-21 NOTE — Telephone Encounter (Signed)
Pt scheduled for colon on 8/27 at 9:30am

## 2019-05-21 NOTE — Telephone Encounter (Signed)
Hi Dr. Rush Landmark, this patient has been referred to [place] for a repeat colon. He had a previous colon in 2014, could you please review his records and advise on scheduling? Thank you.

## 2019-05-22 DIAGNOSIS — L03011 Cellulitis of right finger: Secondary | ICD-10-CM | POA: Diagnosis not present

## 2019-05-22 DIAGNOSIS — D1801 Hemangioma of skin and subcutaneous tissue: Secondary | ICD-10-CM | POA: Diagnosis not present

## 2019-05-22 DIAGNOSIS — Z961 Presence of intraocular lens: Secondary | ICD-10-CM | POA: Diagnosis not present

## 2019-05-22 DIAGNOSIS — L821 Other seborrheic keratosis: Secondary | ICD-10-CM | POA: Diagnosis not present

## 2019-05-22 DIAGNOSIS — H401411 Capsular glaucoma with pseudoexfoliation of lens, right eye, mild stage: Secondary | ICD-10-CM | POA: Diagnosis not present

## 2019-05-22 DIAGNOSIS — L570 Actinic keratosis: Secondary | ICD-10-CM | POA: Diagnosis not present

## 2019-05-22 DIAGNOSIS — H5201 Hypermetropia, right eye: Secondary | ICD-10-CM | POA: Diagnosis not present

## 2019-05-22 DIAGNOSIS — Z85828 Personal history of other malignant neoplasm of skin: Secondary | ICD-10-CM | POA: Diagnosis not present

## 2019-05-22 DIAGNOSIS — L814 Other melanin hyperpigmentation: Secondary | ICD-10-CM | POA: Diagnosis not present

## 2019-05-22 DIAGNOSIS — D225 Melanocytic nevi of trunk: Secondary | ICD-10-CM | POA: Diagnosis not present

## 2019-06-11 ENCOUNTER — Ambulatory Visit (AMBULATORY_SURGERY_CENTER): Payer: Self-pay

## 2019-06-11 ENCOUNTER — Other Ambulatory Visit: Payer: Self-pay

## 2019-06-11 VITALS — Ht 73.0 in | Wt 192.4 lb

## 2019-06-11 DIAGNOSIS — Z86010 Personal history of colonic polyps: Secondary | ICD-10-CM

## 2019-06-11 MED ORDER — PEG 3350-KCL-NA BICARB-NACL 420 G PO SOLR: 4000.0000 mL | Freq: Once | ORAL | 0 refills | Status: AC

## 2019-06-11 NOTE — Progress Notes (Signed)
Denies allergies to eggs or soy products. Denies complication of anesthesia or sedation. Denies use of weight loss medication. Denies use of O2.   Emmi instructions given for colonoscopy.  

## 2019-06-22 ENCOUNTER — Encounter: Payer: Self-pay | Admitting: Gastroenterology

## 2019-06-23 ENCOUNTER — Other Ambulatory Visit: Payer: Self-pay

## 2019-06-23 ENCOUNTER — Ambulatory Visit (INDEPENDENT_AMBULATORY_CARE_PROVIDER_SITE_OTHER): Payer: Medicare Other

## 2019-06-23 DIAGNOSIS — Z23 Encounter for immunization: Secondary | ICD-10-CM

## 2019-06-24 ENCOUNTER — Telehealth: Payer: Self-pay | Admitting: *Deleted

## 2019-06-24 NOTE — Telephone Encounter (Signed)
Covid-19 screening questions   Do you now or have you had a fever in the last 14 days? no  Do you have any respiratory symptoms of shortness of breath or cough now or in the last 14 days? no  Do you have any family members or close contacts with diagnosed or suspected Covid-19 in the past 14 days? no  Have you been tested for Covid-19 and found to be positive? No        

## 2019-06-25 ENCOUNTER — Other Ambulatory Visit: Payer: Self-pay

## 2019-06-25 ENCOUNTER — Encounter: Payer: Self-pay | Admitting: Gastroenterology

## 2019-06-25 ENCOUNTER — Ambulatory Visit (AMBULATORY_SURGERY_CENTER): Payer: Medicare Other | Admitting: Gastroenterology

## 2019-06-25 VITALS — BP 119/76 | HR 54 | Temp 98.2°F | Resp 13 | Ht 73.0 in | Wt 192.0 lb

## 2019-06-25 DIAGNOSIS — K635 Polyp of colon: Secondary | ICD-10-CM | POA: Diagnosis not present

## 2019-06-25 DIAGNOSIS — Z86010 Personal history of colonic polyps: Secondary | ICD-10-CM

## 2019-06-25 DIAGNOSIS — D127 Benign neoplasm of rectosigmoid junction: Secondary | ICD-10-CM

## 2019-06-25 DIAGNOSIS — D125 Benign neoplasm of sigmoid colon: Secondary | ICD-10-CM | POA: Diagnosis not present

## 2019-06-25 DIAGNOSIS — Z1211 Encounter for screening for malignant neoplasm of colon: Secondary | ICD-10-CM | POA: Diagnosis not present

## 2019-06-25 DIAGNOSIS — D123 Benign neoplasm of transverse colon: Secondary | ICD-10-CM | POA: Diagnosis not present

## 2019-06-25 MED ORDER — SODIUM CHLORIDE 0.9 % IV SOLN: 500.0000 mL | Freq: Once | INTRAVENOUS | Status: DC

## 2019-06-25 MED ORDER — FLEET ENEMA 7-19 GM/118ML RE ENEM
1.0000 | ENEMA | Freq: Once | RECTAL | Status: AC
  Administered 2019-06-25: 09:00:00 1 via RECTAL

## 2019-06-25 NOTE — Progress Notes (Signed)
A and O x3. Report to RN. Tolerated MAC anesthesia well.

## 2019-06-25 NOTE — Op Note (Signed)
[place] Patient Name: Brandon [name] Procedure Date: 06/25/2019 9:43 AM MRN: [id number] Endoscopist: Justice Britain , MD Age: 73 Referring MD:  Date of Birth: 1946/07/15 Gender: Male Account #: [id number] Procedure:                Colonoscopy Indications:              High risk colon cancer surveillance: Personal                            history of colonic polyps Medicines:                Monitored Anesthesia Care Procedure:                Pre-Anesthesia Assessment:                           - Prior to the procedure, a History and Physical                            was performed, and patient medications and                            allergies were reviewed. The patient's tolerance of                            previous anesthesia was also reviewed. The risks                            and benefits of the procedure and the sedation                            options and risks were discussed with the patient.                            All questions were answered, and informed consent                            was obtained. Prior Anticoagulants: The patient has                            taken no previous anticoagulant or antiplatelet                            agents except for aspirin. ASA Grade Assessment: II                            - A patient with mild systemic disease. After                            reviewing the risks and benefits, the patient was                            deemed in satisfactory condition to undergo the  procedure.                           After obtaining informed consent, the colonoscope                            was passed under direct vision. Throughout the                            procedure, the patient's blood pressure, pulse, and                            oxygen saturations were monitored continuously. The                            Colonoscope was introduced through the anus and           advanced to the 5 cm into the ileum. The                            colonoscopy was performed without difficulty. The                            patient tolerated the procedure. The quality of the                            bowel preparation was adequate. The terminal ileum,                            ileocecal valve, appendiceal orifice, and rectum                            were photographed. Scope In: 9:54:13 AM Scope Out: 10:17:46 AM Scope Withdrawal Time: 0 hours 19 minutes 28 seconds  Total Procedure Duration: 0 hours 23 minutes 33 seconds  Findings:                 The digital rectal exam findings include                            hemorrhoids. Pertinent negatives include no                            palpable rectal lesions.                           The terminal ileum and ileocecal valve appeared                            normal.                           A moderate amount of semi-liquid stool was found in                            the entire colon, interfering with visualization.  Lavage of the area was performed using copious                            amounts, resulting in clearance with adequate                            visualization.                           Four sessile polyps were found in the recto-sigmoid                            colon (1), sigmoid colon (1) and transverse colon                            (2). The polyps were 3 to 4 mm in size. These                            polyps were removed with a cold snare. Resection                            and retrieval were complete.                           Normal mucosa was found in the entire colon                            otherwise.                           Non-bleeding non-thrombosed external and internal                            hemorrhoids were found during retroflexion, during                            perianal exam and during digital exam. The                             hemorrhoids were Grade II (internal hemorrhoids                            that prolapse but reduce spontaneously). Complications:            No immediate complications. Estimated Blood Loss:     Estimated blood loss was minimal. Impression:               - Hemorrhoids found on digital rectal exam.                           - The examined portion of the ileum was normal.                           - Stool in the entire examined colon. Lavaged with  adequate visualization.                           - Four 3 to 4 mm polyps at the recto-sigmoid colon,                            in the sigmoid colon and in the transverse colon,                            removed with a cold snare. Resected and retrieved.                           - Normal mucosa in the entire examined colon.                           - Non-bleeding non-thrombosed external and internal                            hemorrhoids. Recommendation:           - The patient will be observed post-procedure,                            until all discharge criteria are met.                           - Discharge patient to home.                           - Patient has a contact number available for                            emergencies. The signs and symptoms of potential                            delayed complications were discussed with the                            patient. Return to normal activities tomorrow.                            Written discharge instructions were provided to the                            patient.                           - High fiber diet.                           - Use FiberCon 1 tablet PO daily.                           - Continue present medications.                           -  Await pathology results.                           - Repeat colonoscopy 3/5/7 years for surveillance                            based on pathology results and findings of                             adenomatous tissue.                           - The findings and recommendations were discussed                            with the patient. Justice Britain, MD 06/25/2019 10:29:16 AM

## 2019-06-25 NOTE — Progress Notes (Signed)
Patient states he took about 60% of the prep last night and 40% of the prep this am, reports cloudy brown stool at last BM, Dr. Rush Landmark notified, order taken for Fleets enema, Fleets enema given, result is clear yellow, MD notified.

## 2019-06-25 NOTE — Progress Notes (Signed)
Temperature taken by C.W., VS taken by N.C.

## 2019-06-25 NOTE — Patient Instructions (Signed)
Thank you for allowing [place] to care for you today!  Await pathology results by mail, approximately 2 weeks.  Recommendation for next colonoscopy will be made at that time.  Recommend adopting high fiber diet.  Also suggest using FiberCon 1 tablet by mouth daily.  Continue present medications.  Return to your normal activities tomorrow.    YOU HAD AN ENDOSCOPIC PROCEDURE TODAY AT [facility]:   Refer to the procedure report that was given to you for any specific questions about what was found during the examination.  If the procedure report does not answer your questions, please call your gastroenterologist to clarify.  If you requested that your care partner not be given the details of your procedure findings, then the procedure report has been included in a sealed envelope for you to review at your convenience later.  YOU SHOULD EXPECT: Some feelings of bloating in the abdomen. Passage of more gas than usual.  Walking can help get rid of the air that was put into your GI tract during the procedure and reduce the bloating. If you had a lower endoscopy (such as a colonoscopy or flexible sigmoidoscopy) you may notice spotting of blood in your stool or on the toilet paper. If you underwent a bowel prep for your procedure, you may not have a normal bowel movement for a few days.  Please Note:  You might notice some irritation and congestion in your nose or some drainage.  This is from the oxygen used during your procedure.  There is no need for concern and it should clear up in a day or so.  SYMPTOMS TO REPORT IMMEDIATELY:   Following lower endoscopy (colonoscopy or flexible sigmoidoscopy):  Excessive amounts of blood in the stool  Significant tenderness or worsening of abdominal pains  Swelling of the abdomen that is new, acute  Fever of 100F or higher   For urgent or emergent issues, a gastroenterologist can be reached at any hour by calling [phone].   DIET:  We do  recommend a small meal at first, but then you may proceed to your regular diet.  Drink plenty of fluids but you should avoid alcoholic beverages for 24 hours.  ACTIVITY:  You should plan to take it easy for the rest of today and you should NOT DRIVE or use heavy machinery until tomorrow (because of the sedation medicines used during the test).    FOLLOW UP: Our staff will call the number listed on your records 48-72 hours following your procedure to check on you and address any questions or concerns that you may have regarding the information given to you following your procedure. If we do not reach you, we will leave a message.  We will attempt to reach you two times.  During this call, we will ask if you have developed any symptoms of COVID 19. If you develop any symptoms (ie: fever, flu-like symptoms, shortness of breath, cough etc.) before then, please call [phone].  If you test positive for Covid 19 in the 2 weeks post procedure, please call and report this information to [place].    If any biopsies were taken you will be contacted by phone or by letter within the next 1-3 weeks.  Please call us at [phone] if you have not heard about the biopsies in 3 weeks.    SIGNATURES/CONFIDENTIALITY: You and/or your care partner have signed paperwork which will be entered into your electronic medical record.  These signatures attest to the  fact that that the information above on your After Visit Summary has been reviewed and is understood.  Full responsibility of the confidentiality of this discharge information lies with you and/or your care-partner.

## 2019-06-25 NOTE — Progress Notes (Signed)
Called to room to assist during endoscopic procedure.  Patient ID and intended procedure confirmed with present staff. Received instructions for my participation in the procedure from the performing physician.  

## 2019-06-25 NOTE — Progress Notes (Signed)
Pt's states no medical or surgical changes since previsit or office visit. 

## 2019-06-29 ENCOUNTER — Telehealth: Payer: Self-pay | Admitting: *Deleted

## 2019-06-29 NOTE — Telephone Encounter (Signed)
  Follow up Call-  Call back number 06/25/2019  Post procedure Call Back phone  # [phone]  Permission to leave phone message Yes  Some recent data might be hidden     Patient questions:  Do you have a fever, pain , or abdominal swelling? No. Pain Score  0 *  Have you tolerated food without any problems? Yes.    Have you been able to return to your normal activities? Yes.    Do you have any questions about your discharge instructions: Diet   No. Medications  No. Follow up visit  No.  Do you have questions or concerns about your Care? No.  Actions: * If pain score is 4 or above: No action needed, pain <4.  1. Have you developed a fever since your procedure? no  2.   Have you had an respiratory symptoms (SOB or cough) since your procedure? no  3.   Have you tested positive for COVID 19 since your procedure no  4.   Have you had any family members/close contacts diagnosed with the COVID 19 since your procedure?  no   If yes to any of these questions please route to Joylene John, RN and Alphonsa Gin, Therapist, sports.

## 2019-07-02 DIAGNOSIS — L720 Epidermal cyst: Secondary | ICD-10-CM | POA: Diagnosis not present

## 2019-07-02 DIAGNOSIS — C44719 Basal cell carcinoma of skin of left lower limb, including hip: Secondary | ICD-10-CM | POA: Diagnosis not present

## 2019-07-02 DIAGNOSIS — Z85828 Personal history of other malignant neoplasm of skin: Secondary | ICD-10-CM | POA: Diagnosis not present

## 2019-07-02 DIAGNOSIS — L84 Corns and callosities: Secondary | ICD-10-CM | POA: Diagnosis not present

## 2019-07-03 ENCOUNTER — Encounter: Payer: Self-pay | Admitting: Gastroenterology

## 2019-08-24 ENCOUNTER — Other Ambulatory Visit: Payer: Self-pay

## 2019-08-24 ENCOUNTER — Ambulatory Visit (INDEPENDENT_AMBULATORY_CARE_PROVIDER_SITE_OTHER): Payer: Medicare Other | Admitting: Internal Medicine

## 2019-08-24 ENCOUNTER — Encounter: Payer: Self-pay | Admitting: Internal Medicine

## 2019-08-24 VITALS — BP 112/70 | HR 62 | Temp 98.0°F | Ht 73.0 in | Wt 188.6 lb

## 2019-08-24 DIAGNOSIS — Z9889 Other specified postprocedural states: Secondary | ICD-10-CM

## 2019-08-24 DIAGNOSIS — I499 Cardiac arrhythmia, unspecified: Secondary | ICD-10-CM

## 2019-08-24 DIAGNOSIS — Z9289 Personal history of other medical treatment: Secondary | ICD-10-CM

## 2019-08-24 NOTE — Progress Notes (Signed)
Location:  [facility]   Place of Service:     Provider: Dr. Hollace Kinnier  Code Status: DNR Goals of Care:  Advanced Directives 11/17/2018  Does Patient Have a Medical Advance Directive? Yes  Type of Paramedic of [place];Living will;Out of facility DNR (pink MOST or yellow form)  Does patient want to make changes to medical advance directive? No - Patient declined  Copy of [place] in Chart? Yes - validated most recent copy scanned in chart (See row information)  Pre-existing out of facility DNR order (yellow form or pink MOST form) Yellow form placed in chart (order not valid for inpatient use)     Chief Complaint  Patient presents with  . Acute Visit    Complains of irregular heartbeat.     HPI: Patient is a 73 y.o. male seen today for an acute visit for irregular heartbeat. Symptoms began this morning. He checks his blood pressure daily with his personal machine. Today, his machine indicated he had an irregular heartbeat. In addition, he felt his own pulse and stated the beats were not regular. He had 2 cups coffee prior to taking his vitals. He denies any physical exertion yesterday or today prior to symptoms. He also states he has had some lightheadedness this morning and chest tightness. He denies chest pain, shortness of breath, signs of blood loss, or calf pain/swelling. He has a history of cardioversion, two times, for irregular heart rate in the late 1990's. In addition, he was given medication to regulate his rate.     Past Medical History:  Diagnosis Date  . Cataract   . Cervicalgia 03/29/2015  . History of colonic polyps   . Hyperlipidemia   . Hypertension   . Thrombocytopenia ([place])   . Vitamin D deficiency     Past Surgical History:  Procedure Laterality Date  . ARM SKIN LESION BIOPSY / EXCISION Left 02/2016   atypical lentigmous and nested melanocytic proliferation--Dr. Harriett Sine   . CATARACT EXTRACTION Bilateral january and february 2018    Allergies  Allergen Reactions  . Zocor [Simvastatin] Other (See Comments)    Memory loss  . Niaspan [Niacin Er]     Outpatient Encounter Medications as of 08/24/2019  Medication Sig  . aspirin EC 81 MG tablet Take 81 mg by mouth daily.  . Cholecalciferol (VITAMIN D3) 1000 units CAPS Take by mouth daily.  . fenofibrate 160 MG tablet TAKE 1 TABLET BY MOUTH  DAILY  . hydrochlorothiazide (HYDRODIURIL) 25 MG tablet TAKE 1 TABLET BY MOUTH  DAILY  . lisinopril (ZESTRIL) 40 MG tablet TAKE 1 TABLET BY MOUTH  DAILY  . vitamin B-12 (CYANOCOBALAMIN) 1000 MCG tablet Take 1,000 mcg by mouth daily.   No facility-administered encounter medications on file as of 08/24/2019.     Review of Systems:  Review of Systems  Respiratory: Positive for chest tightness. Negative for cough, shortness of breath and wheezing.   Cardiovascular: Negative for chest pain and leg swelling.       Chest tightness  Gastrointestinal: Negative for abdominal pain and blood in stool.  Genitourinary: Negative for hematuria.  Musculoskeletal:       Denies calf pain  Neurological: Positive for light-headedness. Negative for dizziness and headaches.  Psychiatric/Behavioral: Negative for dysphoric mood. The patient is not nervous/anxious.     Health Maintenance  Topic Date Due  . COLONOSCOPY  06/24/2024  . TETANUS/TDAP  05/27/2028  . INFLUENZA VACCINE  Completed  .  Hepatitis C Screening  Completed  . PNA vac Low Risk Adult  Completed    Physical Exam: Vitals:   08/24/19 1419  Weight: 188 lb 9.6 oz (85.5 kg)  Height: 6\' 1"  (1.854 m)   Body mass index is 24.88 kg/m. Physical Exam Vitals signs reviewed.  Constitutional:      Appearance: Normal appearance. He is normal weight.  Cardiovascular:     Rate and Rhythm: Normal rate and regular rhythm.     Pulses: Normal pulses.     Heart sounds: Normal heart sounds. No murmur.  Pulmonary:     Effort:  Pulmonary effort is normal. No respiratory distress.     Breath sounds: Normal breath sounds. No wheezing.  Abdominal:     General: Abdomen is flat. Bowel sounds are normal. There is no distension.     Palpations: Abdomen is soft.     Tenderness: There is no abdominal tenderness.  Skin:    General: Skin is warm and dry.     Capillary Refill: Capillary refill takes less than 2 seconds.  Neurological:     Mental Status: He is alert.  Psychiatric:        Mood and Affect: Mood normal.        Behavior: Behavior normal.        Thought Content: Thought content normal.        Judgment: Judgment normal.     Labs reviewed: Basic Metabolic Panel: Recent Labs    05/15/19 0809  NA 140  K 4.1  CL 105  CO2 29  GLUCOSE 93  BUN 26*  CREATININE 1.20*  CALCIUM 9.3   Liver Function Tests: Recent Labs    05/15/19 0809  AST 18  ALT 17  BILITOT 0.6  PROT 6.5   No results for input(s): LIPASE, AMYLASE in the last 8760 hours. No results for input(s): AMMONIA in the last 8760 hours. CBC: Recent Labs    05/15/19 0809  WBC 5.1  NEUTROABS 3,473  HGB 15.4  HCT 45.5  MCV 92.3  PLT 167   Lipid Panel: Recent Labs    05/15/19 0809  CHOL 151  HDL 56  LDLCALC 79  TRIG 76  CHOLHDL 2.7   No results found for: HGBA1C  Procedures since last visit: No results found.  Assessment/Plan 1. Irregular heart rhythm - EKG 12-lead- today - EKG normal sinus rhythm - cardiac auscultation with no adventitious sounds - no calf pain, swelling or heat - BP and HR at goal - history of cardioversion over 15 years ago, two episodes - complete blood count with differential/platelets- today - basic metabolic panel- today - educated patient to contact PCP if episode reoccurs, will then consider cardiology referral for holter monitor - continue to take daily pressure and heart rate - limit caffeine intake - stay hydrated, especially on days with increased physical activity - contact PCP if any  black tarry stool, blood in urine, or other signs of bleeding   Labs/tests ordered:  Complete blood count with differential/platelets, basic metabolic panel- today Next appt:  11/10/2019

## 2019-08-24 NOTE — Patient Instructions (Signed)
Improve your hydration with water. Avoid excessive caffeine.   If the irregular heartbeat returns, let me know and we may need to get you in with cardiology for a heart monitor.

## 2019-08-26 LAB — TEST AUTHORIZATION

## 2019-08-26 LAB — CBC WITH DIFFERENTIAL/PLATELET
Absolute Monocytes: 557 cells/uL (ref 200–950)
Basophils Absolute: 32 cells/uL (ref 0–200)
Basophils Relative: 0.5 %
Eosinophils Absolute: 90 cells/uL (ref 15–500)
Eosinophils Relative: 1.4 %
HCT: 45.4 % (ref 38.5–50.0)
Hemoglobin: 15.4 g/dL (ref 13.2–17.1)
Lymphs Abs: 1101 cells/uL (ref 850–3900)
MCH: 30.7 pg (ref 27.0–33.0)
MCHC: 33.9 g/dL (ref 32.0–36.0)
MCV: 90.4 fL (ref 80.0–100.0)
MPV: 12 fL (ref 7.5–12.5)
Monocytes Relative: 8.7 %
Neutro Abs: 4621 cells/uL (ref 1500–7800)
Neutrophils Relative %: 72.2 %
Platelets: 155 10*3/uL (ref 140–400)
RBC: 5.02 10*6/uL (ref 4.20–5.80)
RDW: 11.8 % (ref 11.0–15.0)
Total Lymphocyte: 17.2 %
WBC: 6.4 10*3/uL (ref 3.8–10.8)

## 2019-08-26 LAB — BASIC METABOLIC PANEL
BUN/Creatinine Ratio: 19 (calc) (ref 6–22)
BUN: 23 mg/dL (ref 7–25)
CO2: 28 mmol/L (ref 20–32)
Calcium: 9.1 mg/dL (ref 8.6–10.3)
Chloride: 105 mmol/L (ref 98–110)
Creat: 1.2 mg/dL — ABNORMAL HIGH (ref 0.70–1.18)
Glucose, Bld: 86 mg/dL (ref 65–139)
Potassium: 3.9 mmol/L (ref 3.5–5.3)
Sodium: 143 mmol/L (ref 135–146)

## 2019-08-26 LAB — TSH: TSH: 1.23 mIU/L (ref 0.40–4.50)

## 2019-11-10 ENCOUNTER — Encounter: Payer: Medicare Other | Admitting: Adult Health

## 2019-11-10 ENCOUNTER — Encounter: Payer: Medicare Other | Admitting: Family

## 2019-11-13 ENCOUNTER — Encounter: Payer: Self-pay | Admitting: Internal Medicine

## 2019-11-20 ENCOUNTER — Encounter: Payer: Self-pay | Admitting: Family

## 2019-11-20 ENCOUNTER — Ambulatory Visit (INDEPENDENT_AMBULATORY_CARE_PROVIDER_SITE_OTHER): Payer: Medicare Other | Admitting: Family

## 2019-11-20 ENCOUNTER — Other Ambulatory Visit: Payer: Self-pay

## 2019-11-20 DIAGNOSIS — Z0000 Encounter for general adult medical examination without abnormal findings: Secondary | ICD-10-CM | POA: Diagnosis not present

## 2019-11-20 NOTE — Progress Notes (Signed)
Subjective:   Kiro Stemler is a 74 y.o. male who presents for Medicare Annual/Subsequent preventive examination.  Review of Systems:   Cardiac Risk Factors include: advanced age (>43men, >51 women);hypertension;male gender;dyslipidemia     Objective:    Vitals: There were no vitals taken for this visit.  There is no height or weight on file to calculate BMI.  Advanced Directives 11/20/2019 08/24/2019 11/17/2018 05/15/2018 11/14/2017 10/28/2017 01/29/2017  Does Patient Have a Medical Advance Directive? Yes Yes Yes Yes Yes Yes Yes  Type of Paramedic of [place];Living will;Out of facility DNR (pink MOST or yellow form) Out of facility DNR (pink MOST or yellow form);[place];Living will [place];Living will;Out of facility DNR (pink MOST or yellow form) [place];Living will;Out of facility DNR (pink MOST or yellow form) [place];Living will [place];Living will [place];Living will  Does patient want to make changes to medical advance directive? No - Patient declined No - Patient declined No - Patient declined No - Patient declined No - Patient declined No - Patient declined -  Copy of [place] in Chart? Yes - validated most recent copy scanned in chart (See row information) Yes - validated most recent copy scanned in chart (See row information) Yes - validated most recent copy scanned in chart (See row information) Yes Yes Yes -  Pre-existing out of facility DNR order (yellow form or pink MOST form) Yellow form placed in chart (order not valid for inpatient use) Yellow form placed in chart (order not valid for inpatient use) Yellow form placed in chart (order not valid for inpatient use) Yellow form placed in chart (order not valid for inpatient use) - - -    Tobacco Social History   Tobacco Use  Smoking Status Never Smoker    Smokeless Tobacco Never Used     Counseling given: Not Answered   Clinical Intake:  Pre-visit preparation completed: No  Pain : No/denies pain     BMI - recorded: 24.88 Nutritional Status: BMI of 19-24  Normal Nutritional Risks: None Diabetes: No  How often do you need to have someone help you when you read instructions, pamphlets, or other written materials from your doctor or pharmacy?: 1 - Never What is the last grade level you completed in school?: Master Degree  Interpreter Needed?: No  Information entered by :: Naome Brigandi FNP-C  Past Medical History:  Diagnosis Date  . Cataract   . Cervicalgia 03/29/2015  . History of colonic polyps   . Hyperlipidemia   . Hypertension   . Thrombocytopenia ([place])   . Vitamin D deficiency    Past Surgical History:  Procedure Laterality Date  . ARM SKIN LESION BIOPSY / EXCISION Left 02/2016   atypical lentigmous and nested melanocytic proliferation--Dr. Harriett Sine  . CATARACT EXTRACTION Bilateral january and february 2018   Family History  Problem Relation Age of Onset  . Clotting disorder Father   . Suicidality Father   . High Cholesterol Mother   . Renal Disease Mother   . Stroke Mother 64  . Colon cancer Neg Hx   . Esophageal cancer Neg Hx   . Stomach cancer Neg Hx   . Rectal cancer Neg Hx    Social History   Socioeconomic History  . Marital status: Married    Spouse name: Not on file  . Number of children: Not on file  . Years of education:  Not on file  . Highest education level: Not on file  Occupational History  . Not on file  Tobacco Use  . Smoking status: Never Smoker  . Smokeless tobacco: Never Used  Substance and Sexual Activity  . Alcohol use: Yes    Alcohol/week: 1.0 standard drinks    Types: 1 Cans of beer per week    Comment: once a week  . Drug use: No  . Sexual activity: Not Currently  Other Topics Concern  . Not on file  Social History Narrative   Diet- well balanced   Caffeine-  Yes (coffee)   Married- Yes, [place], 1 story with 2 persons   Pets- 1 cat   Current/Past profession- Gaffer   Exercise- Yes ( walking and aerobics)   Living Will- Yes   DNR- N/A   POA/HPOA- N/A      Social Determinants of Health   Financial Resource Strain:   . Difficulty of Paying Living Expenses: Not on file  Food Insecurity:   . Worried About Charity fundraiser in the Last Year: Not on file  . Ran Out of Food in the Last Year: Not on file  Transportation Needs:   . Lack of Transportation (Medical): Not on file  . Lack of Transportation (Non-Medical): Not on file  Physical Activity:   . Days of Exercise per Week: Not on file  . Minutes of Exercise per Session: Not on file  Stress:   . Feeling of Stress : Not on file  Social Connections:   . Frequency of Communication with Friends and Family: Not on file  . Frequency of Social Gatherings with Friends and Family: Not on file  . Attends Religious Services: Not on file  . Active Member of Clubs or Organizations: Not on file  . Attends Archivist Meetings: Not on file  . Marital Status: Not on file    Outpatient Encounter Medications as of 11/20/2019  Medication Sig  . aspirin EC 81 MG tablet Take 81 mg by mouth daily.  . Cholecalciferol (VITAMIN D3) 1000 units CAPS Take by mouth daily.  . fenofibrate 160 MG tablet TAKE 1 TABLET BY MOUTH  DAILY  . hydrochlorothiazide (HYDRODIURIL) 25 MG tablet TAKE 1 TABLET BY MOUTH  DAILY  . lisinopril (ZESTRIL) 40 MG tablet TAKE 1 TABLET BY MOUTH  DAILY  . vitamin B-12 (CYANOCOBALAMIN) 1000 MCG tablet Take 1,000 mcg by mouth daily.   No facility-administered encounter medications on file as of 11/20/2019.    Activities of Daily Living In your present state of health, do you have any difficulty performing the following activities: 11/20/2019  Hearing? N  Vision? N  Difficulty concentrating or making decisions? N  Walking or climbing stairs? N  Dressing  or bathing? N  Doing errands, shopping? N  Preparing Food and eating ? N  Using the Toilet? N  In the past six months, have you accidently leaked urine? N  Do you have problems with loss of bowel control? N  Managing your Medications? N  Managing your Finances? N  Housekeeping or managing your Housekeeping? N  Some recent data might be hidden    Patient Care Team: Gayland Curry, DO as PCP - General (Geriatric Medicine)   Assessment:   This is a routine wellness examination for Vijay.  Exercise Activities and Dietary recommendations Current Exercise Habits: Home exercise routine, Type of exercise: Other - see comments(bike), Time (Minutes): > 60, Frequency (Times/Week): 3, Weekly  Exercise (Minutes/Week): 0, Intensity: Moderate, Exercise limited by: None identified  Goals    . <enter goal here>     Starting 10/25/16, I will maintain my current exercise lifestyle of biking 50 miles per week.     . Exercise 3x per week (30 min per time)     I will continue with my biking.     . Patient Stated     He wants to bike more throughout the week to do a longer biking trip eventually.       Fall Risk Fall Risk  11/20/2019 05/21/2019 04/27/2019 11/17/2018 11/17/2018  Falls in the past year? 0 0 1 0 0  Number falls in past yr: 0 0 0 - 0  Comment - - patient states fell last 2 weeks from his bike - -  Injury with Fall? 0 0 0 - 0  Comment - - - - -  Risk for fall due to : - - - History of fall(s) -  Follow up - - - - -   Is the patient's home free of loose throw rugs in walkways, pet beds, electrical cords, etc?   yes      Grab bars in the bathroom? yes      Handrails on the stairs?   yes      Adequate lighting?   yes  Depression Screen PHQ 2/9 Scores 11/20/2019 05/21/2019 11/17/2018 11/17/2018  PHQ - 2 Score 0 0 0 0    Cognitive Function MMSE - Mini Mental State Exam 11/17/2018 10/28/2017 10/25/2016 03/21/2015  Orientation to time 5 5 5 5   Orientation to Place 4 5 5 5   Registration  3 3 3 3   Attention/ Calculation 5 5 5 5   Recall 3 3 3 3   Language- name 2 objects 2 2 2 2   Language- repeat 1 1 1 1   Language- follow 3 step command 3 3 2 3   Language- read & follow direction 1 1 1 1   Write a sentence 1 1 1 1   Copy design 1 1 1 1   Total score 29 30 29 30      6CIT Screen 11/20/2019  What Year? 0 points  What month? 0 points  What time? 0 points  Count back from 20 0 points  Months in reverse 0 points  Repeat phrase 0 points  Total Score 0    Immunization History  Administered Date(s) Administered  . Fluad Quad(high Dose 65+) 06/23/2019  . Influenza,inj,Quad PF,6+ Mos 07/25/2015, 08/03/2016, 07/29/2018  . Influenza-Unspecified 08/29/2017  . Pneumococcal Conjugate-13 03/21/2015  . Pneumococcal Polysaccharide-23 04/06/2016  . Tdap 10/30/2007, 05/27/2018  . Zoster 10/29/2012  . Zoster Recombinat (Shingrix) 03/08/2018, 05/27/2018    Qualifies for Shingles Vaccine? Up to date   Screening Tests Health Maintenance  Topic Date Due  . COLONOSCOPY  06/24/2024  . TETANUS/TDAP  05/27/2028  . INFLUENZA VACCINE  Completed  . Hepatitis C Screening  Completed  . PNA vac Low Risk Adult  Completed   Cancer Screenings: Lung: Low Dose CT Chest recommended if Age 52-80 years, 30 pack-year currently smoking OR have quit w/in 15years. Patient does not qualify. Colorectal:06/24/24   Additional Screenings: Hepatitis C Screening:completed       Plan:  - Has upcoming COVID-19 vaccine appointment 12/04/2019   I have personally reviewed and noted the following in the patient's chart:   . Medical and social history . Use of alcohol, tobacco or illicit drugs  . Current medications and supplements . Functional ability and status .  Nutritional status . Physical activity . Advanced directives . List of other physicians . Hospitalizations, surgeries, and ER visits in previous 12 months . Vitals . Screenings to include cognitive, depression, and falls . Referrals and  appointments  In addition, I have reviewed and discussed with patient certain preventive protocols, quality metrics, and best practice recommendations. A written personalized care plan for preventive services as well as general preventive health recommendations were provided to patient.    Sandrea Hughs, NP  11/20/2019

## 2019-11-20 NOTE — Patient Instructions (Signed)
Brandon [name] , Thank you for taking time to come for your Medicare Wellness Visit. I appreciate your ongoing commitment to your health goals. Please review the following plan we discussed and let me know if I can assist you in the future.   Screening recommendations/referrals: Colonoscopy: Up to date  Recommended yearly ophthalmology/optometry visit for glaucoma screening and checkup Recommended yearly dental visit for hygiene and checkup  Vaccinations: Influenza vaccine: Up to date  Pneumococcal vaccine  Up to date  Tdap vaccine  Up to date due 05/27/2028  Shingles vaccine: Up to date    Advanced directives: Yes   Conditions/risks identified: Advance Age men > 77 yrs,male Gender,Hypertension,Dyslipidemia   Next appointment: 1 year   Preventive Care 71 Years and Older, Male Preventive care refers to lifestyle choices and visits with your health care provider that can promote health and wellness. What does preventive care include?  A yearly physical exam. This is also called an annual well check.  Dental exams once or twice a year.  Routine eye exams. Ask your health care provider how often you should have your eyes checked.  Personal lifestyle choices, including:  Daily care of your teeth and gums.  Regular physical activity.  Eating a healthy diet.  Avoiding tobacco and drug use.  Limiting alcohol use.  Practicing safe sex.  Taking low doses of aspirin every day.  Taking vitamin and mineral supplements as recommended by your health care provider. What happens during an annual well check? The services and screenings done by your health care provider during your annual well check will depend on your age, overall health, lifestyle risk factors, and family history of disease. Counseling  Your health care provider may ask you questions about your:  Alcohol use.  Tobacco use.  Drug use.  Emotional well-being.  Home and relationship well-being.  Sexual  activity.  Eating habits.  History of falls.  Memory and ability to understand (cognition).  Work and work Statistician. Screening  You may have the following tests or measurements:  Height, weight, and BMI.  Blood pressure.  Lipid and cholesterol levels. These may be checked every 5 years, or more frequently if you are over 62 years old.  Skin check.  Lung cancer screening. You may have this screening every year starting at age 39 if you have a 30-pack-year history of smoking and currently smoke or have quit within the past 15 years.  Fecal occult blood test (FOBT) of the stool. You may have this test every year starting at age 16.  Flexible sigmoidoscopy or colonoscopy. You may have a sigmoidoscopy every 5 years or a colonoscopy every 10 years starting at age 95.  Prostate cancer screening. Recommendations will vary depending on your family history and other risks.  Hepatitis C blood test.  Hepatitis B blood test.  Sexually transmitted disease (STD) testing.  Diabetes screening. This is done by checking your blood sugar (glucose) after you have not eaten for a while (fasting). You may have this done every 1-3 years.  Abdominal aortic aneurysm (AAA) screening. You may need this if you are a current or former smoker.  Osteoporosis. You may be screened starting at age 3 if you are at high risk. Talk with your health care provider about your test results, treatment options, and if necessary, the need for more tests. Vaccines  Your health care provider may recommend certain vaccines, such as:  Influenza vaccine. This is recommended every year.  Tetanus, diphtheria, and acellular pertussis (Tdap, Td)  vaccine. You may need a Td booster every 10 years.  Zoster vaccine. You may need this after age 34.  Pneumococcal 13-valent conjugate (PCV13) vaccine. One dose is recommended after age 17.  Pneumococcal polysaccharide (PPSV23) vaccine. One dose is recommended after age  36. Talk to your health care provider about which screenings and vaccines you need and how often you need them. This information is not intended to replace advice given to you by your health care provider. Make sure you discuss any questions you have with your health care provider. Document Released: 11/11/2015 Document Revised: 07/04/2016 Document Reviewed: 08/16/2015 Elsevier Interactive Patient Education  2017 [place] Prevention in the Home Falls can cause injuries. They can happen to people of all ages. There are many things you can do to make your home safe and to help prevent falls. What can I do on the outside of my home?  Regularly fix the edges of walkways and driveways and fix any cracks.  Remove anything that might make you trip as you walk through a door, such as a raised step or threshold.  Trim any bushes or trees on the path to your home.  Use bright outdoor lighting.  Clear any walking paths of anything that might make someone trip, such as rocks or tools.  Regularly check to see if handrails are loose or broken. Make sure that both sides of any steps have handrails.  Any raised decks and porches should have guardrails on the edges.  Have any leaves, snow, or ice cleared regularly.  Use sand or salt on walking paths during winter.  Clean up any spills in your garage right away. This includes oil or grease spills. What can I do in the bathroom?  Use night lights.  Install grab bars by the toilet and in the tub and shower. Do not use towel bars as grab bars.  Use non-skid mats or decals in the tub or shower.  If you need to sit down in the shower, use a plastic, non-slip stool.  Keep the floor dry. Clean up any water that spills on the floor as soon as it happens.  Remove soap buildup in the tub or shower regularly.  Attach bath mats securely with double-sided non-slip rug tape.  Do not have throw rugs and other things on the floor that can make  you trip. What can I do in the bedroom?  Use night lights.  Make sure that you have a light by your bed that is easy to reach.  Do not use any sheets or blankets that are too big for your bed. They should not hang down onto the floor.  Have a firm chair that has side arms. You can use this for support while you get dressed.  Do not have throw rugs and other things on the floor that can make you trip. What can I do in the kitchen?  Clean up any spills right away.  Avoid walking on wet floors.  Keep items that you use a lot in easy-to-reach places.  If you need to reach something above you, use a strong step stool that has a grab bar.  Keep electrical cords out of the way.  Do not use floor polish or wax that makes floors slippery. If you must use wax, use non-skid floor wax.  Do not have throw rugs and other things on the floor that can make you trip. What can I do with my stairs?  Do not leave  any items on the stairs.  Make sure that there are handrails on both sides of the stairs and use them. Fix handrails that are broken or loose. Make sure that handrails are as long as the stairways.  Check any carpeting to make sure that it is firmly attached to the stairs. Fix any carpet that is loose or worn.  Avoid having throw rugs at the top or bottom of the stairs. If you do have throw rugs, attach them to the floor with carpet tape.  Make sure that you have a light switch at the top of the stairs and the bottom of the stairs. If you do not have them, ask someone to add them for you. What else can I do to help prevent falls?  Wear shoes that:  Do not have high heels.  Have rubber bottoms.  Are comfortable and fit you well.  Are closed at the toe. Do not wear sandals.  If you use a stepladder:  Make sure that it is fully opened. Do not climb a closed stepladder.  Make sure that both sides of the stepladder are locked into place.  Ask someone to hold it for you, if  possible.  Clearly mark and make sure that you can see:  Any grab bars or handrails.  First and last steps.  Where the edge of each step is.  Use tools that help you move around (mobility aids) if they are needed. These include:  Canes.  Walkers.  Scooters.  Crutches.  Turn on the lights when you go into a dark area. Replace any light bulbs as soon as they burn out.  Set up your furniture so you have a clear path. Avoid moving your furniture around.  If any of your floors are uneven, fix them.  If there are any pets around you, be aware of where they are.  Review your medicines with your doctor. Some medicines can make you feel dizzy. This can increase your chance of falling. Ask your doctor what other things that you can do to help prevent falls. This information is not intended to replace advice given to you by your health care provider. Make sure you discuss any questions you have with your health care provider. Document Released: 08/11/2009 Document Revised: 03/22/2016 Document Reviewed: 11/19/2014 Elsevier Interactive Patient Education  2017 [employer].

## 2019-11-20 NOTE — Progress Notes (Signed)
Patient ID: Brandon [name], male   DOB: Oct 28, 1946, 74 y.o.   MRN: [id number] This service is provided via telemedicine  No vital signs collected/recorded due to the encounter was a telemedicine visit.   Location of patient (ex: home, work):  HOME  Patient consents to a telephone visit:  YES  Location of the provider (ex: office, home):  OFFICE  Name of any referring provider:  TIFFANY REED,DO  Names of all persons participating in the telemedicine service and their role in the encounter:  PATIENT, [name], [place], [place], NP  Time spent on call:  6:33

## 2019-11-26 DIAGNOSIS — L570 Actinic keratosis: Secondary | ICD-10-CM | POA: Diagnosis not present

## 2019-11-26 DIAGNOSIS — D485 Neoplasm of uncertain behavior of skin: Secondary | ICD-10-CM | POA: Diagnosis not present

## 2019-11-26 DIAGNOSIS — L821 Other seborrheic keratosis: Secondary | ICD-10-CM | POA: Diagnosis not present

## 2019-11-26 DIAGNOSIS — L814 Other melanin hyperpigmentation: Secondary | ICD-10-CM | POA: Diagnosis not present

## 2019-11-26 DIAGNOSIS — Z85828 Personal history of other malignant neoplasm of skin: Secondary | ICD-10-CM | POA: Diagnosis not present

## 2019-11-26 DIAGNOSIS — D225 Melanocytic nevi of trunk: Secondary | ICD-10-CM | POA: Diagnosis not present

## 2019-11-26 DIAGNOSIS — D1801 Hemangioma of skin and subcutaneous tissue: Secondary | ICD-10-CM | POA: Diagnosis not present

## 2019-11-29 ENCOUNTER — Ambulatory Visit: Payer: Medicare Other

## 2019-12-03 ENCOUNTER — Ambulatory Visit: Payer: Medicare Other | Attending: Internal Medicine

## 2019-12-03 DIAGNOSIS — Z23 Encounter for immunization: Secondary | ICD-10-CM | POA: Insufficient documentation

## 2019-12-03 NOTE — Progress Notes (Signed)
   Covid-19 Vaccination Clinic  Name:  Brandon [name]    MRN: [id number] DOB: [birth year]  12/03/2019  Brandon [name] was observed post Covid-19 immunization for 15 minutes without incidence. He was provided with Vaccine Information Sheet and instruction to access the V-Safe system.   Brandon [name] was instructed to call 911 with any severe reactions post vaccine: Marland Kitchen Difficulty breathing  . Swelling of your face and throat  . A fast heartbeat  . A bad rash all over your body  . Dizziness and weakness    Immunizations Administered    Name Date Dose VIS Date Route   Pfizer COVID-19 Vaccine 12/03/2019 10:44 AM 0.3 mL 10/09/2019 Intramuscular   Manufacturer: [place]   Lot: [id number]   [place]: [id number]

## 2019-12-04 ENCOUNTER — Ambulatory Visit: Payer: Medicare Other

## 2019-12-24 ENCOUNTER — Other Ambulatory Visit: Payer: Self-pay | Admitting: Internal Medicine

## 2019-12-28 ENCOUNTER — Ambulatory Visit: Payer: Medicare Other | Attending: Internal Medicine

## 2019-12-28 DIAGNOSIS — Z23 Encounter for immunization: Secondary | ICD-10-CM

## 2019-12-28 NOTE — Progress Notes (Signed)
   Covid-19 Vaccination Clinic  Name:  Neyland Brunning    MRN: [id number] DOB: 25-Sep-1946  12/28/2019  Mr. Rees was observed post Covid-19 immunization for 15 minutes without incidence. He was provided with Vaccine Information Sheet and instruction to access the V-Safe system.   Mr. Hillenbrand was instructed to call 911 with any severe reactions post vaccine: Marland Kitchen Difficulty breathing  . Swelling of your face and throat  . A fast heartbeat  . A bad rash all over your body  . Dizziness and weakness    Immunizations Administered    Name Date Dose VIS Date Route   Pfizer COVID-19 Vaccine 12/28/2019  9:29 AM 0.3 mL 10/09/2019 Intramuscular   Manufacturer: [place]   Lot: [id number]   [place]: [id number]

## 2020-05-14 ENCOUNTER — Other Ambulatory Visit: Payer: Self-pay | Admitting: Internal Medicine

## 2020-05-23 ENCOUNTER — Other Ambulatory Visit: Payer: Self-pay

## 2020-05-23 ENCOUNTER — Other Ambulatory Visit: Payer: Medicare Other

## 2020-05-23 DIAGNOSIS — I10 Essential (primary) hypertension: Secondary | ICD-10-CM

## 2020-05-23 DIAGNOSIS — D696 Thrombocytopenia, unspecified: Secondary | ICD-10-CM

## 2020-05-23 DIAGNOSIS — E785 Hyperlipidemia, unspecified: Secondary | ICD-10-CM

## 2020-05-23 LAB — COMPLETE METABOLIC PANEL WITH GFR
AG Ratio: 2.3 (calc) (ref 1.0–2.5)
ALT: 19 U/L (ref 9–46)
AST: 25 U/L (ref 10–35)
Albumin: 4.3 g/dL (ref 3.6–5.1)
Alkaline phosphatase (APISO): 39 U/L (ref 35–144)
BUN: 25 mg/dL (ref 7–25)
CO2: 26 mmol/L (ref 20–32)
Calcium: 9.1 mg/dL (ref 8.6–10.3)
Chloride: 108 mmol/L (ref 98–110)
Creat: 1.11 mg/dL (ref 0.70–1.18)
GFR, Est African American: 76 mL/min/{1.73_m2} (ref >=60)
GFR, Est Non African American: 66 mL/min/{1.73_m2} (ref >=60)
Globulin: 1.9 g/dL (calc) (ref 1.9–3.7)
Glucose, Bld: 88 mg/dL (ref 65–99)
Potassium: 3.8 mmol/L (ref 3.5–5.3)
Sodium: 142 mmol/L (ref 135–146)
Total Bilirubin: 0.7 mg/dL (ref 0.2–1.2)
Total Protein: 6.2 g/dL (ref 6.1–8.1)

## 2020-05-23 LAB — CBC WITH DIFFERENTIAL/PLATELET
Absolute Monocytes: 394 cells/uL (ref 200–950)
Basophils Absolute: 38 cells/uL (ref 0–200)
Basophils Relative: 0.7 %
Eosinophils Absolute: 113 cells/uL (ref 15–500)
Eosinophils Relative: 2.1 %
HCT: 43.6 % (ref 38.5–50.0)
Hemoglobin: 14.8 g/dL (ref 13.2–17.1)
Lymphs Abs: 1085 cells/uL (ref 850–3900)
MCH: 30.3 pg (ref 27.0–33.0)
MCHC: 33.9 g/dL (ref 32.0–36.0)
MCV: 89.2 fL (ref 80.0–100.0)
MPV: 12 fL (ref 7.5–12.5)
Monocytes Relative: 7.3 %
Neutro Abs: 3769 cells/uL (ref 1500–7800)
Neutrophils Relative %: 69.8 %
Platelets: 139 10*3/uL — ABNORMAL LOW (ref 140–400)
RBC: 4.89 10*6/uL (ref 4.20–5.80)
RDW: 12.2 % (ref 11.0–15.0)
Total Lymphocyte: 20.1 %
WBC: 5.4 10*3/uL (ref 3.8–10.8)

## 2020-05-23 LAB — LIPID PANEL
Cholesterol: 125 mg/dL (ref <200)
HDL: 53 mg/dL (ref >=40)
LDL Cholesterol (Calc): 60 mg/dL (calc)
Non-HDL Cholesterol (Calc): 72 mg/dL (calc) (ref <130)
Total CHOL/HDL Ratio: 2.4 (calc) (ref <5.0)
Triglycerides: 41 mg/dL (ref <150)

## 2020-05-24 NOTE — Progress Notes (Signed)
Cholesterol is at goal. Mildly low platelets on cbc.  Would monitor. Electrolytes, liver and kidneys all normal

## 2020-05-26 ENCOUNTER — Ambulatory Visit (INDEPENDENT_AMBULATORY_CARE_PROVIDER_SITE_OTHER): Payer: Medicare Other | Admitting: Internal Medicine

## 2020-05-26 ENCOUNTER — Other Ambulatory Visit: Payer: Self-pay

## 2020-05-26 ENCOUNTER — Encounter: Payer: Self-pay | Admitting: Internal Medicine

## 2020-05-26 VITALS — BP 134/84 | HR 67 | Temp 97.8°F | Ht 73.0 in | Wt 188.6 lb

## 2020-05-26 DIAGNOSIS — D696 Thrombocytopenia, unspecified: Secondary | ICD-10-CM

## 2020-05-26 DIAGNOSIS — M436 Torticollis: Secondary | ICD-10-CM | POA: Diagnosis not present

## 2020-05-26 DIAGNOSIS — R4701 Aphasia: Secondary | ICD-10-CM

## 2020-05-26 DIAGNOSIS — M542 Cervicalgia: Secondary | ICD-10-CM | POA: Diagnosis not present

## 2020-05-26 DIAGNOSIS — I10 Essential (primary) hypertension: Secondary | ICD-10-CM | POA: Diagnosis not present

## 2020-05-26 DIAGNOSIS — E785 Hyperlipidemia, unspecified: Secondary | ICD-10-CM

## 2020-05-26 NOTE — Progress Notes (Signed)
Location:  [facility] clinic Provider:  Obryan Radu L. Mariea Clonts, D.O., C.M.D.  Code Status: DNR Goals of Care:  Advanced Directives 05/26/2020  Does Patient Have a Medical Advance Directive? Yes  Type of Advance Directive Out of facility DNR (pink MOST or yellow form)  Does patient want to make changes to medical advance directive? No - Patient declined  Copy of [place] in Chart? -  Pre-existing out of facility DNR order (yellow form or pink MOST form) -     Chief Complaint  Patient presents with  . Medical Management of Chronic Issues    6 month follow up  . Acute Visit    Neck/shoulder pain from cycling     HPI: Patient is a 74 y.o. male seen today for medical management of chronic diseases.    He has been good except one exception.  [place]  That night he had terrible pain in his neck and into his shoulder.  Stayed 4 nights and it toned down and he rode back.  He can turn to the left, but if he turns right or up a little, he feels it.  He gets some fatigue in his neck typically when riding due to position.  Platelets 1 point low.  He had thrombocytopenia once on labs we checked when he first moved here.  He does reports his father had a blockage in his leg.  His mother had carotid artery blockages.  Both of them smoked.      LDL at goal now.  Had been eating too much ice cream previously.  Past Medical History:  Diagnosis Date  . Cataract   . Cervicalgia 03/29/2015  . History of colonic polyps   . Hyperlipidemia   . Hypertension   . Thrombocytopenia ([place])   . Vitamin D deficiency     Past Surgical History:  Procedure Laterality Date  . ARM SKIN LESION BIOPSY / EXCISION Left 02/2016   atypical lentigmous and nested melanocytic proliferation--Dr. Harriett Sine  . CATARACT EXTRACTION Bilateral january and february 2018    Allergies  Allergen Reactions  . Zocor [Simvastatin] Other (See Comments)    Memory loss    . Niaspan [Niacin Er]     Outpatient Encounter Medications as of 05/26/2020  Medication Sig  . aspirin EC 81 MG tablet Take 81 mg by mouth daily.  . Cholecalciferol (VITAMIN D3) 1000 units CAPS Take by mouth daily.  . fenofibrate 160 MG tablet TAKE 1 TABLET BY MOUTH  DAILY  . hydrochlorothiazide (HYDRODIURIL) 25 MG tablet TAKE 1 TABLET BY MOUTH  DAILY  . lisinopril (ZESTRIL) 40 MG tablet TAKE 1 TABLET BY MOUTH  DAILY  . vitamin B-12 (CYANOCOBALAMIN) 1000 MCG tablet Take 1,000 mcg by mouth daily.   No facility-administered encounter medications on file as of 05/26/2020.    Review of Systems:  Review of Systems  Constitutional: Negative for chills and fever.  HENT: Negative for congestion and hearing loss.   Eyes: Negative for blurred vision.  Respiratory: Negative for cough and shortness of breath.   Cardiovascular: Negative for chest pain and palpitations.  Gastrointestinal: Negative for abdominal pain, blood in stool, constipation and melena.  Genitourinary: Negative for dysuria, frequency and urgency.  Musculoskeletal: Positive for myalgias and neck pain. Negative for falls and joint pain.  Skin: Negative for itching and rash.  Neurological: Negative for dizziness and loss of consciousness.  Endo/Heme/Allergies: Does not bruise/bleed easily.  Psychiatric/Behavioral: Negative for depression and memory loss.       Aphasia    Health Maintenance  Topic Date Due  . INFLUENZA VACCINE  05/29/2020  . COLONOSCOPY  06/24/2024  . TETANUS/TDAP  05/27/2028  . COVID-19 Vaccine  Completed  . Hepatitis C Screening  Completed  . PNA vac Low Risk Adult  Completed    Physical Exam: Vitals:   05/26/20 0732  BP: (!) 134/84  Pulse: 67  Temp: 97.8 F (36.6 C)  TempSrc: Temporal  SpO2: 97%  Weight: 188 lb 9.6 oz (85.5 kg)  Height: 6\' 1"  (1.854 m)   Body mass index is 24.88 kg/m. Physical Exam Vitals reviewed.  Constitutional:      General: He is not in acute distress.     Appearance: Normal appearance. He is not toxic-appearing.  HENT:     Head: Normocephalic and atraumatic.  Neck:     Comments: Right neck stiffness and decreased rotation right; pain with rotation and sidebending right, palpable muscle spasm of trapezius Cardiovascular:     Rate and Rhythm: Normal rate and regular rhythm.     Pulses: Normal pulses.     Heart sounds: Normal heart sounds.  Pulmonary:     Effort: Pulmonary effort is normal.     Breath sounds: Normal breath sounds. No wheezing, rhonchi or rales.  Abdominal:     General: Bowel sounds are normal.  Musculoskeletal:     Cervical back: Neck supple.     Right lower leg: No edema.     Left lower leg: No edema.  Lymphadenopathy:     Cervical: No cervical adenopathy.  Skin:    General: Skin is warm and dry.  Neurological:     General: No focal deficit present.     Mental Status: He is alert and oriented to person, place, and time.     Comments: aphasia  Psychiatric:        Mood and Affect: Mood normal.        Behavior: Behavior normal.        Thought Content: Thought content normal.        Judgment: Judgment normal.     Labs reviewed: Basic Metabolic Panel: Recent Labs    08/24/19 1528 05/23/20 0805  NA 143 142  K 3.9 3.8  CL 105 108  CO2 28 26  GLUCOSE 86 88  BUN 23 25  CREATININE 1.20* 1.11  CALCIUM 9.1 9.1  TSH 1.23  --    Liver Function Tests: Recent Labs    05/23/20 0805  AST 25  ALT 19  BILITOT 0.7  PROT 6.2   No results for input(s): LIPASE, AMYLASE in the last 8760 hours. No results for input(s): AMMONIA in the last 8760 hours. CBC: Recent Labs    08/24/19 1528 05/23/20 0805  WBC 6.4 5.4  NEUTROABS 4,621 3,769  HGB 15.4 14.8  HCT 45.4 43.6  MCV 90.4 89.2  PLT 155 139*   Lipid Panel: Recent Labs    05/23/20 0805  CHOL 125  HDL 53  LDLCALC 60  TRIG 41  CHOLHDL 2.4    Assessment/Plan 1. Torticollis, acquired - discussed heat, referral for OMT, therapy exercises and further  advice about bike fitting - Ambulatory referral to Sports Medicine  2. Cervicalgia - has some chronically when cycling, but newly having above  3. Thrombocytopenia ([place]) -very mild, had once before, will recheck - CBC with Differential/Platelet; Future  4. Essential hypertension -doing fine on hctz and lisinopril, hydrating  well and limited riding in heat  5. Aphasia -has had this since I've known him, I don't note any progression today  6. Hyperlipidemia, unspecified hyperlipidemia type -LDL at goal, cont healthy diet and regular excercise  Labs/tests ordered:  Cbc before Next appt:  6 mos med mgt   Joelie Schou L. Dinita Migliaccio, D.O. [facility] [address], [place] [address] Cell Phone (Mon-Fri 8am-5pm):  [phone] On Call:  [phone] & follow prompts after 5pm & weekends Office Phone:  [phone] Office Fax:  [phone]

## 2020-05-27 DIAGNOSIS — L821 Other seborrheic keratosis: Secondary | ICD-10-CM | POA: Diagnosis not present

## 2020-05-27 DIAGNOSIS — D1801 Hemangioma of skin and subcutaneous tissue: Secondary | ICD-10-CM | POA: Diagnosis not present

## 2020-05-27 DIAGNOSIS — L814 Other melanin hyperpigmentation: Secondary | ICD-10-CM | POA: Diagnosis not present

## 2020-05-27 DIAGNOSIS — D225 Melanocytic nevi of trunk: Secondary | ICD-10-CM | POA: Diagnosis not present

## 2020-05-27 DIAGNOSIS — Z85828 Personal history of other malignant neoplasm of skin: Secondary | ICD-10-CM | POA: Diagnosis not present

## 2020-05-27 DIAGNOSIS — C44619 Basal cell carcinoma of skin of left upper limb, including shoulder: Secondary | ICD-10-CM | POA: Diagnosis not present

## 2020-05-27 DIAGNOSIS — L570 Actinic keratosis: Secondary | ICD-10-CM | POA: Diagnosis not present

## 2020-05-28 ENCOUNTER — Encounter: Payer: Self-pay | Admitting: Internal Medicine

## 2020-06-02 DIAGNOSIS — H401411 Capsular glaucoma with pseudoexfoliation of lens, right eye, mild stage: Secondary | ICD-10-CM | POA: Diagnosis not present

## 2020-06-02 DIAGNOSIS — Z961 Presence of intraocular lens: Secondary | ICD-10-CM | POA: Diagnosis not present

## 2020-06-20 ENCOUNTER — Other Ambulatory Visit: Payer: Self-pay

## 2020-06-20 ENCOUNTER — Ambulatory Visit (INDEPENDENT_AMBULATORY_CARE_PROVIDER_SITE_OTHER): Payer: Medicare Other | Admitting: Family Medicine

## 2020-06-20 ENCOUNTER — Encounter: Payer: Self-pay | Admitting: Family Medicine

## 2020-06-20 ENCOUNTER — Ambulatory Visit (INDEPENDENT_AMBULATORY_CARE_PROVIDER_SITE_OTHER): Payer: Medicare Other

## 2020-06-20 VITALS — BP 90/62 | HR 70 | Ht 73.0 in | Wt 184.0 lb

## 2020-06-20 DIAGNOSIS — M542 Cervicalgia: Secondary | ICD-10-CM | POA: Diagnosis not present

## 2020-06-20 DIAGNOSIS — M5030 Other cervical disc degeneration, unspecified cervical region: Secondary | ICD-10-CM | POA: Diagnosis not present

## 2020-06-20 MED ORDER — TIZANIDINE HCL 2 MG PO CAPS: 2.0000 mg | ORAL_CAPSULE | Freq: Three times a day (TID) | ORAL | 0 refills | Status: DC

## 2020-06-20 NOTE — Assessment & Plan Note (Signed)
Patient does have likely some significant arthritic changes.  We discussed the ergonomic changes on the bike but I think will be beneficial.  The icing regimen, home exercise, which activities to do which wants to avoid.  Increase activity slowly.  Discussed a muscle relaxer at night.  X-rays pending.  Follow-up again in 6 to 8 weeks.

## 2020-06-20 NOTE — Progress Notes (Signed)
[place] Phone: [phone] Subjective:   Brandon [name], am serving as a scribe for Dr. Hulan Saas. This visit occurred during the SARS-CoV-2 public health emergency.  Safety protocols were in place, including screening questions prior to the visit, additional usage of staff PPE, and extensive cleaning of exam room while observing appropriate contact time as indicated for disinfecting solutions.   I'm seeing this patient by the request  of:  Gayland Curry, DO  CC: Neck pain  [id number]  Brandon [name] is a 74 y.o. male coming in with complaint of right sided neck pain. Patient does a lot of cycling. Pain with rotation, right. Patient took a couple of days off after having severe pain in July. Has not had another incidence since then. Now notices a lot of cracking. Has not taken anything for pain.  Patient denies any true radicular symptoms at this time but did have it early on.    Past Medical History:  Diagnosis Date  . Cataract   . Cervicalgia 03/29/2015  . History of colonic polyps   . Hyperlipidemia   . Hypertension   . Thrombocytopenia ([place])   . Vitamin D deficiency    Past Surgical History:  Procedure Laterality Date  . ARM SKIN LESION BIOPSY / EXCISION Left 02/2016   atypical lentigmous and nested melanocytic proliferation--Dr. Harriett Sine  . CATARACT EXTRACTION Bilateral january and february 2018   Social History   Socioeconomic History  . Marital status: Married    Spouse name: Not on file  . Number of children: Not on file  . Years of education: Not on file  . Highest education level: Not on file  Occupational History  . Not on file  Tobacco Use  . Smoking status: Never Smoker  . Smokeless tobacco: Never Used  Vaping Use  . Vaping Use: Never used  Substance and Sexual Activity  . Alcohol use: Yes    Alcohol/week: 1.0 standard drink    Types: 1 Cans of beer per week     Comment: once a week  . Drug use: [name]  . Sexual activity: Not Currently  Other Topics Concern  . Not on file  Social History Narrative   Diet- well balanced   Caffeine- Yes (coffee)   Married- Yes, [place], 1 story with 2 persons   Pets- 1 cat   Current/Past profession- Gaffer   Exercise- Yes ( walking and aerobics)   Living Will- Yes   DNR- N/A   POA/HPOA- N/A      Social Determinants of Health   Financial Resource Strain:   . Difficulty of Paying Living Expenses: Not on file  Food Insecurity:   . Worried About Charity fundraiser in the Last Year: Not on file  . Ran Out of Food in the Last Year: Not on file  Transportation Needs:   . Lack of Transportation (Medical): Not on file  . Lack of Transportation (Non-Medical): Not on file  Physical Activity:   . Days of Exercise per Week: Not on file  . Minutes of Exercise per Session: Not on file  Stress:   . Feeling of Stress : Not on file  Social Connections:   . Frequency of Communication with Friends and Family: Not on file  . Frequency of Social Gatherings with Friends and Family: Not on file  . Attends Religious Services: Not on file  . Active Member of  Clubs or Organizations: Not on file  . Attends Archivist Meetings: Not on file  . Marital Status: Not on file   Allergies  Allergen Reactions  . Zocor [Simvastatin] Other (See Comments)    Memory loss  . Niaspan [Niacin Er]    Family History  Problem Relation Age of Onset  . Clotting disorder Father   . Suicidality Father   . High Cholesterol Mother   . Renal Disease Mother   . Stroke Mother 47  . Colon cancer Neg Hx   . Esophageal cancer Neg Hx   . Stomach cancer Neg Hx   . Rectal cancer Neg Hx      Current Outpatient Medications (Cardiovascular):  .  fenofibrate 160 MG tablet, TAKE 1 TABLET BY MOUTH  DAILY .  hydrochlorothiazide (HYDRODIURIL) 25 MG tablet, TAKE 1 TABLET BY MOUTH  DAILY .  lisinopril (ZESTRIL) 40 MG  tablet, TAKE 1 TABLET BY MOUTH  DAILY   Current Outpatient Medications (Analgesics):  .  aspirin EC 81 MG tablet, Take 81 mg by mouth daily.  Current Outpatient Medications (Hematological):  .  vitamin B-12 (CYANOCOBALAMIN) 1000 MCG tablet, Take 1,000 mcg by mouth daily.  Current Outpatient Medications (Other):  Marland Kitchen  Cholecalciferol (VITAMIN D3) 1000 units CAPS, Take by mouth daily. .  tizanidine (ZANAFLEX) 2 MG capsule, Take 1 capsule (2 mg total) by mouth 3 (three) times daily.   Reviewed prior external information including notes and imaging from  primary care provider As well as notes that were available from care everywhere and other healthcare systems.  Past medical history, social, surgical and family history all reviewed in electronic medical record.  [name] pertanent information unless stated regarding to the chief complaint.   Review of Systems:  [name] headache, visual changes, nausea, vomiting, diarrhea, constipation, dizziness, abdominal pain, skin rash, fevers, chills, night sweats, weight loss, swollen lymph nodes, body aches, joint swelling, chest pain, shortness of breath, mood changes. POSITIVE muscle aches  Objective  Blood pressure 90/62, pulse 70, height 6\' 1"  (1.854 m), weight 184 lb (83.5 kg), SpO2 97 %.   General: [name] apparent distress alert and oriented x3 mood and affect normal, dressed appropriately.  HEENT: Pupils equal, extraocular movements intact  Respiratory: Patient's speak in full sentences and does not appear short of breath  Cardiovascular: [name] lower extremity edema, non tender, [name] erythema  Neuro: Cranial nerves II through XII are intact, neurovascularly intact in all extremities with 2+ DTRs and 2+ pulses.  Gait normal with good balance and coordination.  MSK: Mild arthritic changes Neck exam shows severe loss of lordosis with significant tightness of the musculature surrounding the neck muscles.  Patient does have a negative Spurling's though.  Patient only  has 5 degrees of extension of the neck noted.  Mild crepitus with range of motion.  5 out of 5 strength of the upper extremities bilaterally.  [id number]; 15 additional minutes spent for Therapeutic exercises as stated in above notes.  This included exercises focusing on stretching, strengthening, with significant focus on eccentric aspects.   Long term goals include an improvement in range of motion, strength, endurance as well as avoiding reinjury. Patient's frequency would include in 1-2 times a day, 3-5 times a week for a duration of 6-12 weeks. Exercises that included:  Basic scapular stabilization to include adduction and depression of scapula Scaption, focusing on proper movement and good control Internal and External rotation utilizing a theraband, with elbow tucked at side entire time Rows with theraband  Proper technique shown and discussed handout in great detail with ATC.  All questions were discussed and answered.      Impression and Recommendations:     The above documentation has been reviewed and is accurate and complete Lyndal Pulley, DO       Note: This dictation was prepared with Dragon dictation along with smaller phrase technology. Any transcriptional errors that result from this process are unintentional.

## 2020-06-20 NOTE — Patient Instructions (Addendum)
Xray today Zanaflex 2mg  at needed Exercises Good to see you.  Consider Matt Clancey Turmeric 500mg  daily  Tart cherry extract 1200mg  at night Vitamin D 2000 IU daily  See me again in 6 weeks

## 2020-06-21 ENCOUNTER — Encounter: Payer: Self-pay | Admitting: Family Medicine

## 2020-07-26 DIAGNOSIS — Z23 Encounter for immunization: Secondary | ICD-10-CM | POA: Diagnosis not present

## 2020-08-01 ENCOUNTER — Ambulatory Visit: Payer: Medicare Other | Admitting: Family Medicine

## 2020-11-24 ENCOUNTER — Encounter: Payer: Medicare Other | Admitting: Family

## 2020-11-25 ENCOUNTER — Other Ambulatory Visit: Payer: Medicare Other

## 2020-11-28 ENCOUNTER — Ambulatory Visit (INDEPENDENT_AMBULATORY_CARE_PROVIDER_SITE_OTHER): Payer: Medicare Other | Admitting: Family

## 2020-11-28 ENCOUNTER — Encounter: Payer: Self-pay | Admitting: Family

## 2020-11-28 ENCOUNTER — Other Ambulatory Visit: Payer: Medicare Other

## 2020-11-28 ENCOUNTER — Other Ambulatory Visit: Payer: Self-pay

## 2020-11-28 VITALS — BP 110/70 | HR 68 | Temp 97.5°F | Resp 16 | Ht 73.0 in | Wt 187.4 lb

## 2020-11-28 DIAGNOSIS — D696 Thrombocytopenia, unspecified: Secondary | ICD-10-CM | POA: Diagnosis not present

## 2020-11-28 DIAGNOSIS — Z0000 Encounter for general adult medical examination without abnormal findings: Secondary | ICD-10-CM | POA: Diagnosis not present

## 2020-11-28 DIAGNOSIS — R39198 Other difficulties with micturition: Secondary | ICD-10-CM | POA: Diagnosis not present

## 2020-11-28 LAB — CBC WITH DIFFERENTIAL/PLATELET
Absolute Monocytes: 432 cells/uL (ref 200–950)
Basophils Absolute: 32 cells/uL (ref 0–200)
Basophils Relative: 0.6 %
Eosinophils Absolute: 108 cells/uL (ref 15–500)
Eosinophils Relative: 2 %
HCT: 47.1 % (ref 38.5–50.0)
Hemoglobin: 16.5 g/dL (ref 13.2–17.1)
Lymphs Abs: 1210 cells/uL (ref 850–3900)
MCH: 31 pg (ref 27.0–33.0)
MCHC: 35 g/dL (ref 32.0–36.0)
MCV: 88.5 fL (ref 80.0–100.0)
MPV: 11.7 fL (ref 7.5–12.5)
Monocytes Relative: 8 %
Neutro Abs: 3618 cells/uL (ref 1500–7800)
Neutrophils Relative %: 67 %
Platelets: 159 10*3/uL (ref 140–400)
RBC: 5.32 10*6/uL (ref 4.20–5.80)
RDW: 12.2 % (ref 11.0–15.0)
Total Lymphocyte: 22.4 %
WBC: 5.4 10*3/uL (ref 3.8–10.8)

## 2020-11-28 LAB — PSA: PSA: 0.6 ng/mL (ref <=4.0)

## 2020-11-28 NOTE — Progress Notes (Signed)
Subjective:   Brandon [name] is a 75 y.o. male who presents for Medicare Annual/Subsequent preventive examination.  Review of Systems     Cardiac Risk Factors include: advanced age (>81men, >70 women);hypertension;male gender     Objective:    Today's Vitals   11/28/20 0811 11/28/20 0841  BP: 110/70   Pulse: 68   Resp: 16   Temp: (!) 97.5 F (36.4 C)   SpO2: 98%   Weight: 187 lb 6.4 oz (85 kg)   Height: 6\' 1"  (1.854 m)   PainSc:  0-No pain   Body mass index is 24.72 kg/m.  Advanced Directives 11/28/2020 05/26/2020 11/20/2019 08/24/2019 11/17/2018 05/15/2018 11/14/2017  Does Patient Have a Medical Advance Directive? Yes Yes Yes Yes Yes Yes Yes  Type of Paramedic of [place];Out of facility DNR (pink MOST or yellow form);Living will Out of facility DNR (pink MOST or yellow form) [place];Living will;Out of facility DNR (pink MOST or yellow form) Out of facility DNR (pink MOST or yellow form);[place];Living will [place];Living will;Out of facility DNR (pink MOST or yellow form) [place];Living will;Out of facility DNR (pink MOST or yellow form) [place];Living will  Does patient want to make changes to medical advance directive? No - Patient declined No - Patient declined No - Patient declined No - Patient declined No - Patient declined No - Patient declined No - Patient declined  Copy of [place] in Chart? Yes - validated most recent copy scanned in chart (See row information) - Yes - validated most recent copy scanned in chart (See row information) Yes - validated most recent copy scanned in chart (See row information) Yes - validated most recent copy scanned in chart (See row information) Yes Yes  Pre-existing out of facility DNR order (yellow form or pink MOST form) - - Yellow form placed in chart (order not valid for inpatient use) Yellow  form placed in chart (order not valid for inpatient use) Yellow form placed in chart (order not valid for inpatient use) Yellow form placed in chart (order not valid for inpatient use) -    Current Medications (verified) Outpatient Encounter Medications as of 11/28/2020  Medication Sig  . aspirin EC 81 MG tablet Take 81 mg by mouth daily.  . Cholecalciferol (VITAMIN D3) 1000 units CAPS Take by mouth daily.  . fenofibrate 160 MG tablet TAKE 1 TABLET BY MOUTH  DAILY  . hydrochlorothiazide (HYDRODIURIL) 25 MG tablet TAKE 1 TABLET BY MOUTH  DAILY  . lisinopril (ZESTRIL) 40 MG tablet TAKE 1 TABLET BY MOUTH  DAILY  . tizanidine (ZANAFLEX) 2 MG capsule Take 1 capsule (2 mg total) by mouth 3 (three) times daily.  . vitamin B-12 (CYANOCOBALAMIN) 1000 MCG tablet Take 1,000 mcg by mouth daily.   No facility-administered encounter medications on file as of 11/28/2020.    Allergies (verified) Zocor [simvastatin] and Niaspan [niacin er]   History: Past Medical History:  Diagnosis Date  . Cataract   . Cervicalgia 03/29/2015  . History of colonic polyps   . Hyperlipidemia   . Hypertension   . Thrombocytopenia ([place])   . Vitamin D deficiency    Past Surgical History:  Procedure Laterality Date  . ARM SKIN LESION BIOPSY / EXCISION Left 02/2016   atypical lentigmous and nested melanocytic proliferation--Dr. Harriett Sine  . CATARACT EXTRACTION Bilateral january and february 2018   Family History  Problem Relation Age of  Onset  . Clotting disorder Father   . Suicidality Father   . High Cholesterol Mother   . Renal Disease Mother   . Stroke Mother 62  . Colon cancer Neg Hx   . Esophageal cancer Neg Hx   . Stomach cancer Neg Hx   . Rectal cancer Neg Hx    Social History   Socioeconomic History  . Marital status: Married    Spouse name: Not on file  . Number of children: Not on file  . Years of education: Not on file  . Highest education level: Not on file  Occupational History  .  Not on file  Tobacco Use  . Smoking status: Never Smoker  . Smokeless tobacco: Never Used  Vaping Use  . Vaping Use: Never used  Substance and Sexual Activity  . Alcohol use: Yes    Alcohol/week: 1.0 standard drink    Types: 1 Cans of beer per week    Comment: every 6 months   . Drug use: No  . Sexual activity: Not Currently  Other Topics Concern  . Not on file  Social History Narrative   Diet- well balanced   Caffeine- Yes (coffee)   Married- Yes, [place], 1 story with 2 persons   Pets- 1 cat   Current/Past profession- Gaffer   Exercise- Yes ( walking and aerobics)   Living Will- Yes   DNR- N/A   POA/HPOA- N/A      Social Determinants of Health   Financial Resource Strain: Not on file  Food Insecurity: Not on file  Transportation Needs: Not on file  Physical Activity: Not on file  Stress: Not on file  Social Connections: Not on file    Tobacco Counseling Counseling given: Not Answered   Clinical Intake:  Pre-visit preparation completed: No  Pain : No/denies pain Pain Score: 0-No pain     BMI - recorded: 24.72 Nutritional Risks: None Diabetes: No  How often do you need to have someone help you when you read instructions, pamphlets, or other written materials from your doctor or pharmacy?: 1 - Never What is the last grade level you completed in school?: Master Degrees  Diabetic?No   Interpreter Needed?: No  Information entered by :: Lavelle Akel FNP-C   Activities of Daily Living In your present state of health, do you have any difficulty performing the following activities: 11/28/2020  Hearing? N  Vision? Y  Difficulty concentrating or making decisions? Y  Comment forgetful  Walking or climbing stairs? N  Dressing or bathing? N  Doing errands, shopping? N  Preparing Food and eating ? N  Using the Toilet? N  In the past six months, have you accidently leaked urine? N  Do you have problems with loss of bowel control? N   Managing your Medications? N  Managing your Finances? N  Housekeeping or managing your Housekeeping? N  Some recent data might be hidden    Patient Care Team: Gayland Curry, DO as PCP - General (Geriatric Medicine)  Indicate any recent Medical Services you may have received from other than [facility] providers in the past year (date may be approximate).     Assessment:   This is a routine wellness examination for [name].  Hearing/Vision screen  Hearing Screening   125Hz  250Hz  500Hz  1000Hz  2000Hz  3000Hz  4000Hz  6000Hz  8000Hz   Right ear:           Left ear:  Comments: No Hearing Concerns.  Vision Screening Comments: No Vision Concerns. Patient wears reading glasses with no prescription. Patient last eye exam was July 2021.   Dietary issues and exercise activities discussed: Current Exercise Habits: Home exercise routine, Type of exercise: walking;Other - see comments (biking), Time (Minutes): > 60, Frequency (Times/Week): 3, Weekly Exercise (Minutes/Week): 0, Intensity: Moderate, Exercise limited by: None identified  Goals    . <enter goal here>     Starting 10/25/16, I will maintain my current exercise lifestyle of biking 50 miles per week.     . Exercise 3x per week (30 min per time)     I will continue with my biking.     . Patient Stated     He wants to bike more throughout the week to do a longer biking trip eventually.      Depression Screen PHQ 2/9 Scores 11/28/2020 05/26/2020 11/20/2019 05/21/2019 11/17/2018 11/17/2018 05/15/2018  PHQ - 2 Score 0 0 0 0 0 0 0    Fall Risk Fall Risk  11/28/2020 05/26/2020 11/20/2019 05/21/2019 04/27/2019  Falls in the past year? 0 0 0 0 1  Number falls in past yr: 0 0 0 0 0  Comment - - - - patient states fell last 2 weeks from his bike  Injury with Fall? 0 0 0 0 0  Comment - - - - -  Risk for fall due to : - - - - -  Follow up - - - - -    [place]:  Any stairs in or around the home? Yes  If  so, are there any without handrails? Yes  Home free of loose throw rugs in walkways, pet beds, electrical cords, etc? yes  Adequate lighting in your home to reduce risk of falls? Yes   ASSISTIVE DEVICES UTILIZED TO PREVENT FALLS:  Life alert? No  Use of a cane, walker or w/c? No  Grab bars in the bathroom? Yes  Shower chair or bench in shower? Yes but does not need it. Elevated toilet seat or a handicapped toilet? Yes    TIMED UP AND GO:  Was the test performed? Yes .  Length of time to ambulate 10 feet: 8 sec.   Gait steady and fast without use of assistive device  Cognitive Function: MMSE - Mini Mental State Exam 11/28/2020 11/17/2018 10/28/2017 10/25/2016 03/21/2015  Orientation to time 5 5 5 5 5   Orientation to time comments 2022, Winter, 31st, Monday, January. - - - -  Orientation to Place 5 4 5 5 5   Orientation to Place-comments [place], [place], [place], [employer]. - - - -  Registration 3 3 3 3 3   Attention/ Calculation 5 5 5 5 5   Recall 3 3 3 3 3   Language- name 2 objects 2 2 2 2 2   Language- repeat 1 1 1 1 1   Language- follow 3 step command 3 3 3 2 3   Language- read & follow direction 0 1 1 1 1   Write a sentence 1 1 1 1 1   Copy design 1 1 1 1 1   Total score 29 29 30 29 30      6CIT Screen 11/20/2019  What Year? 0 points  What month? 0 points  What time? 0 points  Count back from 20 0 points  Months in reverse 0 points  Repeat phrase 0 points  Total Score 0    Immunizations Immunization History  Administered Date(s) Administered  .  Fluad Quad(high Dose 65+) 06/23/2019  . Influenza,inj,Quad PF,6+ Mos 07/25/2015, 08/03/2016, 07/29/2018  . Influenza-Unspecified 08/29/2017  . PFIZER(Purple Top)SARS-COV-2 Vaccination 12/03/2019, 12/28/2019, 07/26/2020  . Pneumococcal Conjugate-13 03/21/2015  . Pneumococcal Polysaccharide-23 04/06/2016  . Tdap 10/30/2007, 05/27/2018  . Zoster 10/29/2012  . Zoster Recombinat (Shingrix) 03/08/2018, 05/27/2018    TDAP  status: Up to date  Flu Vaccine status: Up to date  Pneumococcal vaccine status: Up to date  Covid-19 vaccine status: Completed vaccines  Qualifies for Shingles Vaccine? Yes   Zostavax completed Yes   Shingrix Completed?: Yes  Screening Tests Health Maintenance  Topic Date Due  . INFLUENZA VACCINE  05/29/2020  . COLONOSCOPY (Pts 45-98yrs Insurance coverage will need to be confirmed)  06/24/2024  . TETANUS/TDAP  05/27/2028  . COVID-19 Vaccine  Completed  . Hepatitis C Screening  Completed  . PNA vac Low Risk Adult  Completed    Health Maintenance  Health Maintenance Due  Topic Date Due  . INFLUENZA VACCINE  05/29/2020    Colorectal cancer screening: No longer required.   Lung Cancer Screening: (Low Dose CT Chest recommended if Age 30-80 years, 30 pack-year currently smoking OR have quit w/in 15years.) does not qualify.   Lung Cancer Screening Referral: No  Additional Screening:  Hepatitis C Screening: does not  qualify; Completed Yes   Vision Screening: Recommended annual ophthalmology exams for early detection of glaucoma and other disorders of the eye. Is the patient up to date with their annual eye exam?  Yes  Who is the provider or what is the name of the office in which the patient attends annual eye exams? Dr. Dorothy Spark  If pt is not established with a provider, would they like to be referred to a provider to establish care? No .   Dental Screening: Recommended annual dental exams for proper oral hygiene  Community Resource Referral / Chronic Care Management: CRR required this visit?  No   CCM required this visit?  No    Plan:   - Up to date had Influenza vaccine in Fall,2021 [place] [facility]   I have personally reviewed and noted the following in the patient's chart:   . Medical and social history . Use of alcohol, tobacco or illicit drugs  . Current medications and supplements . Functional ability and status . Nutritional status . Physical  activity . Advanced directives . List of other physicians . Hospitalizations, surgeries, and ER visits in previous 12 months . Vitals . Screenings to include cognitive, depression, and falls . Referrals and appointments  In addition, I have reviewed and discussed with patient certain preventive protocols, quality metrics, and best practice recommendations. A written personalized care plan for preventive services as well as general preventive health recommendations were provided to patient.     Sandrea Hughs, NP   11/28/2020   Nurse Notes: Influenza vaccine due but states had it at volunteer service at [facility] verify with volunteer service at [facility] then notify Dr.Reed

## 2020-11-28 NOTE — Patient Instructions (Addendum)
[name] , Thank you for taking time to come for your Medicare Wellness Visit. I appreciate your ongoing commitment to your health goals. Please review the following plan we discussed and let me know if I can assist you in the future.   Screening recommendations/referrals: Colonoscopy: Up to date  Recommended yearly ophthalmology/optometry visit for glaucoma screening and checkup Recommended yearly dental visit for hygiene and checkup  Vaccinations: Influenza vaccine: Please verify dates with Brandon [name] volunteer service then update Brandon [name] on 12/01/2020  Pneumococcal vaccine: Up date  Tdap vaccine: Up date  Shingles vaccine: Up to date    Advanced directives: Yes   Conditions/risks identified: Advance Age  Men > 55,Hypertension,male Gender   Next appointment: Yes   Preventive Care 74 Years and Older, Male Preventive care refers to lifestyle choices and visits with your health care provider that can promote health and wellness. What does preventive care include?  A yearly physical exam. This is also called an annual well check.  Dental exams once or twice a year.  Routine eye exams. Ask your health care provider how often you should have your eyes checked.  Personal lifestyle choices, including:  Daily care of your teeth and gums.  Regular physical activity.  Eating a healthy diet.  Avoiding tobacco and drug use.  Limiting alcohol use.  Practicing safe sex.  Taking low doses of aspirin every day.  Taking vitamin and mineral supplements as recommended by your health care provider. What happens during an annual well check? The services and screenings done by your health care provider during your annual well check will depend on your age, overall health, lifestyle risk factors, and family history of disease. Counseling  Your health care provider may ask you questions about your:  Alcohol use.  Tobacco use.  Drug use.  Emotional well-being.  Home and  relationship well-being.  Sexual activity.  Eating habits.  History of falls.  Memory and ability to understand (cognition).  Work and work Statistician. Screening  You may have the following tests or measurements:  Height, weight, and BMI.  Blood pressure.  Lipid and cholesterol levels. These may be checked every 5 years, or more frequently if you are over 79 years old.  Skin check.  Lung cancer screening. You may have this screening every year starting at age 60 if you have a 30-pack-year history of smoking and currently smoke or have quit within the past 15 years.  Fecal occult blood test (FOBT) of the stool. You may have this test every year starting at age 6.  Flexible sigmoidoscopy or colonoscopy. You may have a sigmoidoscopy every 5 years or a colonoscopy every 10 years starting at age 87.  Prostate cancer screening. Recommendations will vary depending on your family history and other risks.  Hepatitis C blood test.  Hepatitis B blood test.  Sexually transmitted disease (STD) testing.  Diabetes screening. This is done by checking your blood sugar (glucose) after you have not eaten for a while (fasting). You may have this done every 1-3 years.  Abdominal aortic aneurysm (AAA) screening. You may need this if you are a current or former smoker.  Osteoporosis. You may be screened starting at age 55 if you are at high risk. Talk with your health care provider about your test results, treatment options, and if necessary, the need for more tests. Vaccines  Your health care provider may recommend certain vaccines, such as:  Influenza vaccine. This is recommended every year.  Tetanus, diphtheria, and acellular  pertussis (Tdap, Td) vaccine. You may need a Td booster every 10 years.  Zoster vaccine. You may need this after age 18.  Pneumococcal 13-valent conjugate (PCV13) vaccine. One dose is recommended after age 31.  Pneumococcal polysaccharide (PPSV23) vaccine. One  dose is recommended after age 58. Talk to your health care provider about which screenings and vaccines you need and how often you need them. This information is not intended to replace advice given to you by your health care provider. Make sure you discuss any questions you have with your health care provider. Document Released: 11/11/2015 Document Revised: 07/04/2016 Document Reviewed: 08/16/2015 Elsevier Interactive Patient Education  2017 [place] Prevention in the Home Falls can cause injuries. They can happen to people of all ages. There are many things you can do to make your home safe and to help prevent falls. What can I do on the outside of my home?  Regularly fix the edges of walkways and driveways and fix any cracks.  Remove anything that might make you trip as you walk through a door, such as a raised step or threshold.  Trim any bushes or trees on the path to your home.  Use bright outdoor lighting.  Clear any walking paths of anything that might make someone trip, such as rocks or tools.  Regularly check to see if handrails are loose or broken. Make sure that both sides of any steps have handrails.  Any raised decks and porches should have guardrails on the edges.  Have any leaves, snow, or ice cleared regularly.  Use sand or salt on walking paths during winter.  Clean up any spills in your garage right away. This includes oil or grease spills. What can I do in the bathroom?  Use night lights.  Install grab bars by the toilet and in the tub and shower. Do not use towel bars as grab bars.  Use non-skid mats or decals in the tub or shower.  If you need to sit down in the shower, use a plastic, non-slip stool.  Keep the floor dry. Clean up any water that spills on the floor as soon as it happens.  Remove soap buildup in the tub or shower regularly.  Attach bath mats securely with double-sided non-slip rug tape.  Do not have throw rugs and other  things on the floor that can make you trip. What can I do in the bedroom?  Use night lights.  Make sure that you have a light by your bed that is easy to reach.  Do not use any sheets or blankets that are too big for your bed. They should not hang down onto the floor.  Have a firm chair that has side arms. You can use this for support while you get dressed.  Do not have throw rugs and other things on the floor that can make you trip. What can I do in the kitchen?  Clean up any spills right away.  Avoid walking on wet floors.  Keep items that you use a lot in easy-to-reach places.  If you need to reach something above you, use a strong step stool that has a grab bar.  Keep electrical cords out of the way.  Do not use floor polish or wax that makes floors slippery. If you must use wax, use non-skid floor wax.  Do not have throw rugs and other things on the floor that can make you trip. What can I do with my stairs?  Do not leave any items on the stairs.  Make sure that there are handrails on both sides of the stairs and use them. Fix handrails that are broken or loose. Make sure that handrails are as long as the stairways.  Check any carpeting to make sure that it is firmly attached to the stairs. Fix any carpet that is loose or worn.  Avoid having throw rugs at the top or bottom of the stairs. If you do have throw rugs, attach them to the floor with carpet tape.  Make sure that you have a light switch at the top of the stairs and the bottom of the stairs. If you do not have them, ask someone to add them for you. What else can I do to help prevent falls?  Wear shoes that:  Do not have high heels.  Have rubber bottoms.  Are comfortable and fit you well.  Are closed at the toe. Do not wear sandals.  If you use a stepladder:  Make sure that it is fully opened. Do not climb a closed stepladder.  Make sure that both sides of the stepladder are locked into place.  Ask  someone to hold it for you, if possible.  Clearly mark and make sure that you can see:  Any grab bars or handrails.  First and last steps.  Where the edge of each step is.  Use tools that help you move around (mobility aids) if they are needed. These include:  Canes.  Walkers.  Scooters.  Crutches.  Turn on the lights when you go into a dark area. Replace any light bulbs as soon as they burn out.  Set up your furniture so you have a clear path. Avoid moving your furniture around.  If any of your floors are uneven, fix them.  If there are any pets around you, be aware of where they are.  Review your medicines with your doctor. Some medicines can make you feel dizzy. This can increase your chance of falling. Ask your doctor what other things that you can do to help prevent falls. This information is not intended to replace advice given to you by your health care provider. Make sure you discuss any questions you have with your health care provider. Document Released: 08/11/2009 Document Revised: 03/22/2016 Document Reviewed: 11/19/2014 Elsevier Interactive Patient Education  2017 [employer].

## 2020-11-29 NOTE — Progress Notes (Signed)
Platelets are normal this time. PSA is actually lower than before so not concerning.

## 2020-11-30 DIAGNOSIS — L821 Other seborrheic keratosis: Secondary | ICD-10-CM | POA: Diagnosis not present

## 2020-11-30 DIAGNOSIS — D225 Melanocytic nevi of trunk: Secondary | ICD-10-CM | POA: Diagnosis not present

## 2020-11-30 DIAGNOSIS — L570 Actinic keratosis: Secondary | ICD-10-CM | POA: Diagnosis not present

## 2020-11-30 DIAGNOSIS — D1801 Hemangioma of skin and subcutaneous tissue: Secondary | ICD-10-CM | POA: Diagnosis not present

## 2020-11-30 DIAGNOSIS — L814 Other melanin hyperpigmentation: Secondary | ICD-10-CM | POA: Diagnosis not present

## 2020-11-30 DIAGNOSIS — Z85828 Personal history of other malignant neoplasm of skin: Secondary | ICD-10-CM | POA: Diagnosis not present

## 2020-12-01 ENCOUNTER — Ambulatory Visit: Payer: Medicare Other | Admitting: Internal Medicine

## 2020-12-01 ENCOUNTER — Encounter: Payer: Self-pay | Admitting: Family

## 2020-12-01 ENCOUNTER — Ambulatory Visit (INDEPENDENT_AMBULATORY_CARE_PROVIDER_SITE_OTHER): Payer: Medicare Other | Admitting: Family

## 2020-12-01 ENCOUNTER — Other Ambulatory Visit: Payer: Self-pay

## 2020-12-01 VITALS — BP 120/86 | HR 62 | Temp 97.3°F | Resp 16 | Ht 73.0 in | Wt 189.4 lb

## 2020-12-01 DIAGNOSIS — I10 Essential (primary) hypertension: Secondary | ICD-10-CM | POA: Diagnosis not present

## 2020-12-01 DIAGNOSIS — E785 Hyperlipidemia, unspecified: Secondary | ICD-10-CM

## 2020-12-01 DIAGNOSIS — D696 Thrombocytopenia, unspecified: Secondary | ICD-10-CM | POA: Diagnosis not present

## 2020-12-01 DIAGNOSIS — M542 Cervicalgia: Secondary | ICD-10-CM

## 2020-12-01 NOTE — Progress Notes (Signed)
Provider: Adaleigh Warf FNP-C   Reed, Tiffany L, DO  Patient Care Team: Reed, Tiffany L, DO as PCP - General (Geriatric Medicine)  Extended Emergency Contact Information Primary Emergency Contact: Knott,Margery Address: [address]          [place], [place] [address] [place] Home Phone: [phone] Mobile Phone: [phone] Relation: None  Code Status:  DNR Goals of care: Advanced Directive information Advanced Directives 12/01/2020  Does Patient Have a Medical Advance Directive? Yes  Type of Advance Directive Healthcare Power of Attorney;Out of facility DNR (pink MOST or yellow form);Living will  Does patient want to make changes to medical advance directive? No - Patient declined  Copy of Healthcare Power of Attorney in Chart? Yes - validated most recent copy scanned in chart (See row information)  Pre-existing out of facility DNR order (yellow form or pink MOST form) -     Chief Complaint  Patient presents with  . Medical Management of Chronic Issues    6 month follow up.    HPI:  Pt is a 75 y.o. male seen today for 6 months follow up medical management of chronic diseases. He denies any acute issues this visit.Recent labs discussed. States had his Tetanus vaccine at [place] volunteer service but does not have the exact date.He tired to get the dates from the volunteer service but was told records were send to someone else.   Hypertension - checks B/p every so often at home has been normal.He denies any headache,dizziness,fatigue,chest tightness,chest pain or shortness of breath.  Hyperlipidemia - Latest LDL at goal.enjoys Biking at least 50 miles per week when weather is good.States includes veggies in his diet.   Thrombocytopenia - previous 139 ( 05/23/2020) now back to normal 159.No signs of bleeding.   Neck pain - has improved.Takes tizanidine 2 mg tablet as needed.Has not required it in a long time. shoveling snow and biking worsen the pain but  has been doing well so far.     Past Medical History:  Diagnosis Date  . Cataract   . Cervicalgia 03/29/2015  . History of colonic polyps   . Hyperlipidemia   . Hypertension   . Thrombocytopenia (HCC)   . Vitamin D deficiency    Past Surgical History:  Procedure Laterality Date  . ARM SKIN LESION BIOPSY / EXCISION Left 02/2016   atypical lentigmous and nested melanocytic proliferation--Dr. Walter Whitworth  . CATARACT EXTRACTION Bilateral january and february 2018    Allergies  Allergen Reactions  . Zocor [Simvastatin] Other (See Comments)    Memory loss  . Niaspan [Niacin Er]     Allergies as of 12/01/2020      Reactions   Zocor [simvastatin] Other (See Comments)   Memory loss   Niaspan [niacin Er]       Medication List       Accurate as of December 01, 2020  9:54 AM. If you have any questions, ask your nurse or doctor.        aspirin EC 81 MG tablet Take 81 mg by mouth daily.   fenofibrate 160 MG tablet TAKE 1 TABLET BY MOUTH  DAILY   hydrochlorothiazide 25 MG tablet Commonly known as: HYDRODIURIL TAKE 1 TABLET BY MOUTH  DAILY   lisinopril 40 MG tablet Commonly known as: ZESTRIL TAKE 1 TABLET BY MOUTH  DAILY   tizanidine 2 MG capsule Commonly known as: ZANAFLEX Take 2 mg by mouth 3 (three) times daily as needed for muscle spasms. What   changed: Another medication with the same name was removed. Continue taking this medication, and follow the directions you see here. Changed by: Sandrea Hughs, NP   vitamin B-12 1000 MCG tablet Commonly known as: CYANOCOBALAMIN Take 1,000 mcg by mouth daily.   Vitamin D3 25 MCG (1000 UT) Caps Take by mouth daily.       Review of Systems  Constitutional: Negative for appetite change, chills and fatigue.  HENT: Negative for congestion, rhinorrhea, sinus pressure, sinus pain, sneezing, sore throat and trouble swallowing.   Eyes: Negative for discharge, redness and itching.  Respiratory: Negative for cough, chest  tightness, shortness of breath and wheezing.   Cardiovascular: Negative for chest pain, palpitations and leg swelling.  Gastrointestinal: Negative for abdominal distention, abdominal pain, constipation, diarrhea, nausea and vomiting.  Endocrine: Negative for cold intolerance, heat intolerance, polydipsia, polyphagia and polyuria.  Genitourinary: Negative for difficulty urinating, dysuria, flank pain, frequency and urgency.       Voids 1-2 times at night  Musculoskeletal: Positive for neck pain. Negative for arthralgias, back pain and gait problem.  Skin: Negative for color change, pallor and rash.  Neurological: Negative for dizziness, tremors, speech difficulty, weakness, light-headedness, numbness and headaches.  Hematological: Does not bruise/bleed easily.  Psychiatric/Behavioral: Negative for agitation, behavioral problems, confusion and sleep disturbance. The patient is not nervous/anxious.     Immunization History  Administered Date(s) Administered  . Fluad Quad(high Dose 65+) 06/23/2019  . Influenza,inj,Quad PF,6+ Mos 07/25/2015, 08/03/2016, 07/29/2018  . Influenza-Unspecified 08/29/2017  . PFIZER(Purple Top)SARS-COV-2 Vaccination 12/03/2019, 12/28/2019, 07/26/2020  . Pneumococcal Conjugate-13 03/21/2015  . Pneumococcal Polysaccharide-23 04/06/2016  . Tdap 10/30/2007, 05/27/2018  . Zoster 10/29/2012  . Zoster Recombinat (Shingrix) 03/08/2018, 05/27/2018   Pertinent  Health Maintenance Due  Topic Date Due  . INFLUENZA VACCINE  12/01/2020 (Originally 05/29/2020)  . COLONOSCOPY (Pts 45-54yr Insurance coverage will need to be confirmed)  06/24/2024  . PNA vac Low Risk Adult  Completed   Fall Risk  12/01/2020 11/28/2020 05/26/2020 11/20/2019 05/21/2019  Falls in the past year? 0 0 0 0 0  Number falls in past yr: 0 0 0 0 0  Comment - - - - -  Injury with Fall? 0 0 0 0 0  Comment - - - - -  Risk for fall due to : - - - - -  Follow up - - - - -   Functional Status Survey:     Vitals:   12/01/20 0829  BP: 120/86  Pulse: 62  Resp: 16  Temp: (!) 97.3 F (36.3 C)  SpO2: 98%  Weight: 189 lb 6.4 oz (85.9 kg)  Height: 6' 1" (1.854 m)   Body mass index is 24.99 kg/m. Physical Exam Vitals reviewed.  Constitutional:      General: He is not in acute distress.    Appearance: He is not ill-appearing.  HENT:     Head: Normocephalic.     Right Ear: Tympanic membrane, ear canal and external ear normal. There is no impacted cerumen.     Left Ear: Tympanic membrane, ear canal and external ear normal. There is no impacted cerumen.     Nose: Nose normal. No congestion or rhinorrhea.     Mouth/Throat:     Mouth: Mucous membranes are moist.     Pharynx: Oropharynx is clear. No oropharyngeal exudate or posterior oropharyngeal erythema.  Eyes:     General: No scleral icterus.       Right eye: No discharge.  Left eye: No discharge.     Extraocular Movements: Extraocular movements intact.     Conjunctiva/sclera: Conjunctivae normal.     Pupils: Pupils are equal, round, and reactive to light.  Neck:     Vascular: No carotid bruit.  Cardiovascular:     Rate and Rhythm: Normal rate and regular rhythm.     Pulses: Normal pulses.     Heart sounds: Normal heart sounds. No murmur heard. No friction rub. No gallop.   Pulmonary:     Effort: Pulmonary effort is normal. No respiratory distress.     Breath sounds: Normal breath sounds. No wheezing, rhonchi or rales.  Chest:     Chest wall: No tenderness.  Abdominal:     General: Bowel sounds are normal. There is no distension.     Palpations: Abdomen is soft. There is no mass.     Tenderness: There is no abdominal tenderness. There is no right CVA tenderness, left CVA tenderness, guarding or rebound.  Musculoskeletal:        General: No swelling or tenderness. Normal range of motion.     Cervical back: Normal range of motion. No rigidity or tenderness.     Right lower leg: No edema.     Left lower leg: No edema.   Lymphadenopathy:     Cervical: No cervical adenopathy.  Skin:    General: Skin is warm and dry.     Coloration: Skin is not pale.     Findings: No bruising, erythema or rash.  Neurological:     Mental Status: He is alert and oriented to person, place, and time.     Cranial Nerves: No cranial nerve deficit.     Sensory: No sensory deficit.     Motor: No weakness.     Coordination: Coordination normal.     Gait: Gait normal.  Psychiatric:        Mood and Affect: Mood normal.        Behavior: Behavior normal.        Thought Content: Thought content normal.        Judgment: Judgment normal.     Labs reviewed: Recent Labs    05/23/20 0805  NA 142  K 3.8  CL 108  CO2 26  GLUCOSE 88  BUN 25  CREATININE 1.11  CALCIUM 9.1   Recent Labs    05/23/20 0805  AST 25  ALT 19  BILITOT 0.7  PROT 6.2   Recent Labs    05/23/20 0805 11/28/20 0000  WBC 5.4 5.4  NEUTROABS 3,769 3,618  HGB 14.8 16.5  HCT 43.6 47.1  MCV 89.2 88.5  PLT 139* 159   Lab Results  Component Value Date   TSH 1.23 08/24/2019   No results found for: HGBA1C Lab Results  Component Value Date   CHOL 125 05/23/2020   HDL 53 05/23/2020   LDLCALC 60 05/23/2020   TRIG 41 05/23/2020   CHOLHDL 2.4 05/23/2020    Significant Diagnostic Results in last 30 days:  No results found.  Assessment/Plan 1. Essential hypertension B/p well controled. - continue on Hydrochlorothiazide and lisinopril  - continue on ASA for CV event prophylaxis  - continue with dietary and life style modification  - CBC with Differential/Platelet; Future - CMP with eGFR([facility]); Future - TSH; Future  2. Thrombocytopenia (HCC) Latest platelets are are within normal range.No reports of easily bruising or bleeding.   3. Cervicalgia Worst with certain position when biking but tries to keep proper position  which has helped. Has not required his tizanidine takes as needed basis.   4. Hyperlipidemia, unspecified hyperlipidemia  type Latest LDL at goal. - continue on fenofibrate. - Lipid panel; Future   Family/ staff Communication: Reviewed plan of care with patient   Labs/tests ordered: - CBC with Differential/Platelet; Future - CMP with eGFR([facility]); Future - TSH; Future- Lipid panel; Future  Next Appointment : 6 months for Annual Physical Examination.Fasting labs 2-4 days.   Sandrea Hughs, NP

## 2020-12-02 DIAGNOSIS — H401411 Capsular glaucoma with pseudoexfoliation of lens, right eye, mild stage: Secondary | ICD-10-CM | POA: Diagnosis not present

## 2020-12-15 DIAGNOSIS — H401411 Capsular glaucoma with pseudoexfoliation of lens, right eye, mild stage: Secondary | ICD-10-CM | POA: Diagnosis not present

## 2020-12-19 ENCOUNTER — Encounter: Payer: Self-pay | Admitting: Internal Medicine

## 2021-01-18 ENCOUNTER — Telehealth: Payer: Self-pay | Admitting: *Deleted

## 2021-01-18 DIAGNOSIS — Z0289 Encounter for other administrative examinations: Secondary | ICD-10-CM

## 2021-01-18 NOTE — Telephone Encounter (Signed)
Patient dropped off Resident Medical Information form. Patient is wanting to become a Resident of [place] and needs filled out for acceptance.  Patient was a former patient of Dr. Mariea Clonts and decided to go with Webb Silversmith (has previously seen).   Printed Medication/Allergy list and attached.  Placed in Dinah's folder to review and sign. To be faxed to [facility] with [place] Fax: [phone]   Offered patient an appointment to fill out forms but patient refused and stated that he is not suppose to be a part of the paperwork process. Was told to drop it off and the PCP would fill it out and fax it back. Does not want to come in for an appointment.

## 2021-01-18 NOTE — Telephone Encounter (Signed)
Patient notified that forms are ready. Brandon [name] was unable to take charge of $25. Patient will come by office tomorrow morning and pay in office and pick up a copy of form. (Left up front in drawer) Forms faxed today to [address] with [facility] [phone]. Copy sent to scanning. 

## 2021-01-18 NOTE — Telephone Encounter (Signed)
Patient's [facility] Application form completed.given to [facility] to fax or be picked up by patient.

## 2021-01-27 DIAGNOSIS — H401411 Capsular glaucoma with pseudoexfoliation of lens, right eye, mild stage: Secondary | ICD-10-CM | POA: Diagnosis not present

## 2021-02-17 ENCOUNTER — Ambulatory Visit: Payer: Medicare Other | Attending: Internal Medicine

## 2021-02-17 ENCOUNTER — Other Ambulatory Visit (HOSPITAL_BASED_OUTPATIENT_CLINIC_OR_DEPARTMENT_OTHER): Payer: Self-pay

## 2021-02-17 ENCOUNTER — Other Ambulatory Visit: Payer: Self-pay

## 2021-02-17 DIAGNOSIS — Z23 Encounter for immunization: Secondary | ICD-10-CM

## 2021-02-17 MED ORDER — COVID-19 MRNA VAC-TRIS(PFIZER) 30 MCG/0.3ML IM SUSP
INTRAMUSCULAR | 0 refills | Status: DC
  Filled 2021-02-17: qty 0.3, 1d supply, fill #0

## 2021-02-17 NOTE — Progress Notes (Signed)
   Covid-19 Vaccination Clinic  Name:  Brandon [name]    MRN: [id number] DOB: 08-30-1946  02/17/2021  [name] was observed post Covid-19 immunization for 15 minutes without incident. He was provided with Vaccine Information Sheet and instruction to access the V-Safe system.   [name] was instructed to call 911 with any severe reactions post vaccine: Marland Kitchen Difficulty breathing  . Swelling of face and throat  . A fast heartbeat  . A bad rash all over body  . Dizziness and weakness   Immunizations Administered    Name Date Dose VIS Date Route   PFIZER Comrnaty(Gray TOP) Covid-19 Vaccine 02/17/2021  1:44 PM 0.3 mL 10/06/2020 Intramuscular   Manufacturer: [place]   Lot: [id number]   NDC: [phone]

## 2021-05-25 DIAGNOSIS — H401411 Capsular glaucoma with pseudoexfoliation of lens, right eye, mild stage: Secondary | ICD-10-CM | POA: Diagnosis not present

## 2021-05-25 DIAGNOSIS — Z961 Presence of intraocular lens: Secondary | ICD-10-CM | POA: Diagnosis not present

## 2021-05-29 ENCOUNTER — Other Ambulatory Visit: Payer: Medicare Other

## 2021-05-29 DIAGNOSIS — I10 Essential (primary) hypertension: Secondary | ICD-10-CM

## 2021-05-29 DIAGNOSIS — E785 Hyperlipidemia, unspecified: Secondary | ICD-10-CM

## 2021-05-30 ENCOUNTER — Other Ambulatory Visit: Payer: Self-pay

## 2021-05-30 ENCOUNTER — Other Ambulatory Visit: Payer: Medicare Other

## 2021-05-30 ENCOUNTER — Encounter: Payer: Self-pay | Admitting: Family

## 2021-05-30 DIAGNOSIS — L570 Actinic keratosis: Secondary | ICD-10-CM | POA: Diagnosis not present

## 2021-05-30 DIAGNOSIS — D2262 Melanocytic nevi of left upper limb, including shoulder: Secondary | ICD-10-CM | POA: Diagnosis not present

## 2021-05-30 DIAGNOSIS — D692 Other nonthrombocytopenic purpura: Secondary | ICD-10-CM | POA: Diagnosis not present

## 2021-05-30 DIAGNOSIS — L814 Other melanin hyperpigmentation: Secondary | ICD-10-CM | POA: Diagnosis not present

## 2021-05-30 DIAGNOSIS — C434 Malignant melanoma of scalp and neck: Secondary | ICD-10-CM | POA: Diagnosis not present

## 2021-05-30 DIAGNOSIS — D225 Melanocytic nevi of trunk: Secondary | ICD-10-CM | POA: Diagnosis not present

## 2021-05-30 DIAGNOSIS — Z85828 Personal history of other malignant neoplasm of skin: Secondary | ICD-10-CM | POA: Diagnosis not present

## 2021-05-30 DIAGNOSIS — L821 Other seborrheic keratosis: Secondary | ICD-10-CM | POA: Diagnosis not present

## 2021-05-30 DIAGNOSIS — I10 Essential (primary) hypertension: Secondary | ICD-10-CM | POA: Diagnosis not present

## 2021-05-30 DIAGNOSIS — E785 Hyperlipidemia, unspecified: Secondary | ICD-10-CM | POA: Diagnosis not present

## 2021-05-31 ENCOUNTER — Ambulatory Visit (INDEPENDENT_AMBULATORY_CARE_PROVIDER_SITE_OTHER): Payer: Medicare Other | Admitting: Family

## 2021-05-31 ENCOUNTER — Other Ambulatory Visit: Payer: Self-pay

## 2021-05-31 ENCOUNTER — Encounter: Payer: Self-pay | Admitting: Family

## 2021-05-31 VITALS — BP 130/86 | HR 51 | Temp 97.3°F | Resp 16 | Ht 73.0 in | Wt 188.0 lb

## 2021-05-31 DIAGNOSIS — E538 Deficiency of other specified B group vitamins: Secondary | ICD-10-CM

## 2021-05-31 DIAGNOSIS — I10 Essential (primary) hypertension: Secondary | ICD-10-CM

## 2021-05-31 DIAGNOSIS — E782 Mixed hyperlipidemia: Secondary | ICD-10-CM

## 2021-05-31 DIAGNOSIS — M542 Cervicalgia: Secondary | ICD-10-CM | POA: Diagnosis not present

## 2021-05-31 DIAGNOSIS — E559 Vitamin D deficiency, unspecified: Secondary | ICD-10-CM

## 2021-05-31 LAB — COMPLETE METABOLIC PANEL WITH GFR
AG Ratio: 2 (calc) (ref 1.0–2.5)
ALT: 17 U/L (ref 9–46)
AST: 19 U/L (ref 10–35)
Albumin: 4.3 g/dL (ref 3.6–5.1)
Alkaline phosphatase (APISO): 39 U/L (ref 35–144)
BUN/Creatinine Ratio: 24 (calc) — ABNORMAL HIGH (ref 6–22)
BUN: 29 mg/dL — ABNORMAL HIGH (ref 7–25)
CO2: 29 mmol/L (ref 20–32)
Calcium: 9.7 mg/dL (ref 8.6–10.3)
Chloride: 106 mmol/L (ref 98–110)
Creat: 1.2 mg/dL (ref 0.70–1.28)
Globulin: 2.1 g/dL (calc) (ref 1.9–3.7)
Glucose, Bld: 87 mg/dL (ref 65–99)
Potassium: 4.1 mmol/L (ref 3.5–5.3)
Sodium: 143 mmol/L (ref 135–146)
Total Bilirubin: 0.7 mg/dL (ref 0.2–1.2)
Total Protein: 6.4 g/dL (ref 6.1–8.1)
eGFR: 63 mL/min/{1.73_m2} (ref >=60)

## 2021-05-31 LAB — CBC WITH DIFFERENTIAL/PLATELET
Absolute Monocytes: 441 cells/uL (ref 200–950)
Basophils Absolute: 29 cells/uL (ref 0–200)
Basophils Relative: 0.5 %
Eosinophils Absolute: 110 cells/uL (ref 15–500)
Eosinophils Relative: 1.9 %
HCT: 47.1 % (ref 38.5–50.0)
Hemoglobin: 16 g/dL (ref 13.2–17.1)
Lymphs Abs: 1276 cells/uL (ref 850–3900)
MCH: 30.9 pg (ref 27.0–33.0)
MCHC: 34 g/dL (ref 32.0–36.0)
MCV: 90.9 fL (ref 80.0–100.0)
MPV: 11.7 fL (ref 7.5–12.5)
Monocytes Relative: 7.6 %
Neutro Abs: 3944 cells/uL (ref 1500–7800)
Neutrophils Relative %: 68 %
Platelets: 163 10*3/uL (ref 140–400)
RBC: 5.18 10*6/uL (ref 4.20–5.80)
RDW: 12.5 % (ref 11.0–15.0)
Total Lymphocyte: 22 %
WBC: 5.8 10*3/uL (ref 3.8–10.8)

## 2021-05-31 LAB — TSH: TSH: 1.21 mIU/L (ref 0.40–4.50)

## 2021-05-31 LAB — LIPID PANEL
Cholesterol: 143 mg/dL (ref <200)
HDL: 49 mg/dL (ref >=40)
LDL Cholesterol (Calc): 78 mg/dL (calc)
Non-HDL Cholesterol (Calc): 94 mg/dL (calc) (ref <130)
Total CHOL/HDL Ratio: 2.9 (calc) (ref <5.0)
Triglycerides: 82 mg/dL (ref <150)

## 2021-05-31 NOTE — Progress Notes (Signed)
Provider: Marlowe Sax FNP-C   Kaydyn Chism, Nelda Bucks, NP  Patient Care Team: Graziella Connery, Nelda Bucks, NP as PCP - General (Family Medicine)  Extended Emergency Contact Information Primary Emergency Contact: Knott,Margery Address: [address]          [place], [place] [address] Johnnette Litter of [place] Phone: [phone] Mobile Phone: [phone] Relation: None  Code Status:  DNR  Goals of care: Advanced Directive information Advanced Directives 12/01/2020  Does Patient Have a Medical Advance Directive? Yes  Type of Paramedic of [place];Out of facility DNR (pink MOST or yellow form);Living will  Does patient want to make changes to medical advance directive? No - Patient declined  Copy of [place] in Chart? Yes - validated most recent copy scanned in chart (See row information)  Pre-existing out of facility DNR order (yellow form or pink MOST form) -     Chief Complaint  Patient presents with   Medical Management of Chronic Issues    6 month follow up.    Immunizations    Discuss the need for Flu vaccine.     HPI:  Pt is a 75 y.o. male seen today for 6 months follow up for medical management of chronic diseases. Has medical history of Hypertension,Hyperlipidemia,Thrombocytopenia ,vitamin D deficiency,cervicalgia,Cataract among other condition. He denies any acute issues . Has not been exercising or biking due to hot weather.plans to resume exercise in the fall. Will be moving to [facility] facility this coming Tuesday hope to exercise in the Gym and add some swimming too.  Seen sport medicine doctor for neck pain.Has some arthritis.    Recent lab work reviewed and discussed. All lab results unremarkable except BUN 29 and CR 1.20 at baseline. Previous low platelets are normal at 163 previous was 159; 139  Has had no recent hospital admission.No weight loss or gain. He will be under the Care of [facility]  provider [place] and [place] [place] when he transfer to [facility] I have discussed with him to schedule his next appointment with Dr.Gupta.     Past Medical History:  Diagnosis Date   Cataract    Cervicalgia 03/29/2015   History of colonic polyps    Hyperlipidemia    Hypertension    Thrombocytopenia ([place])    Vitamin D deficiency    Past Surgical History:  Procedure Laterality Date   ARM SKIN LESION BIOPSY / EXCISION Left 02/2016   atypical lentigmous and nested melanocytic proliferation--Dr. Harriett Sine   CATARACT EXTRACTION Bilateral january and february 2018    Allergies  Allergen Reactions   Zocor [Simvastatin] Other (See Comments)    Memory loss   Niaspan [Niacin Er]     Allergies as of 05/31/2021       Reactions   Zocor [simvastatin] Other (See Comments)   Memory loss   Niaspan [niacin Er]         Medication List        Accurate as of May 31, 2021  8:32 AM. If you have any questions, ask your nurse or doctor.          aspirin EC 81 MG tablet Take 81 mg by mouth daily.   fenofibrate 160 MG tablet TAKE 1 TABLET BY MOUTH  DAILY   hydrochlorothiazide 25 MG tablet Commonly known as: HYDRODIURIL TAKE 1 TABLET BY MOUTH  DAILY   lisinopril 40 MG tablet Commonly known as: ZESTRIL TAKE 1 TABLET BY MOUTH  DAILY  Pfizer-BioNT COVID-19 Vac-TriS Susp injection Generic drug: COVID-19 mRNA Vac-TriS (Pfizer) Inject into the muscle.   tizanidine 2 MG capsule Commonly known as: ZANAFLEX Take 2 mg by mouth 3 (three) times daily as needed for muscle spasms.   vitamin B-12 1000 MCG tablet Commonly known as: CYANOCOBALAMIN Take 1,000 mcg by mouth daily.   Vitamin D3 25 MCG (1000 UT) Caps Take by mouth daily.        Review of Systems  Constitutional:  Negative for appetite change, chills, fatigue, fever and unexpected weight change.  HENT:  Negative for congestion, dental problem, ear discharge, ear pain, facial swelling, hearing loss,  nosebleeds, postnasal drip, rhinorrhea, sinus pressure, sinus pain, sneezing, sore throat, tinnitus and trouble swallowing.   Eyes:  Negative for pain, discharge, redness, itching and visual disturbance.  Respiratory:  Negative for cough, chest tightness, shortness of breath and wheezing.   Cardiovascular:  Negative for chest pain, palpitations and leg swelling.  Gastrointestinal:  Negative for abdominal distention, abdominal pain, blood in stool, constipation, diarrhea, nausea and vomiting.  Endocrine: Negative for cold intolerance, heat intolerance, polydipsia, polyphagia and polyuria.  Genitourinary:  Negative for difficulty urinating, dysuria, flank pain, frequency, hematuria and urgency.  Musculoskeletal:  Positive for neck pain. Negative for arthralgias, back pain, gait problem, joint swelling, myalgias and neck stiffness.  Skin:  Negative for color change, pallor, rash and wound.  Neurological:  Negative for dizziness, syncope, speech difficulty, weakness, light-headedness, numbness and headaches.  Hematological:  Does not bruise/bleed easily.  Psychiatric/Behavioral:  Negative for agitation, behavioral problems, confusion, hallucinations, self-injury, sleep disturbance and suicidal ideas. The patient is not nervous/anxious.    Immunization History  Administered Date(s) Administered   Fluad Quad(high Dose 65+) 06/23/2019   Influenza,inj,Quad PF,6+ Mos 07/25/2015, 08/03/2016, 07/29/2018   Influenza-Unspecified 08/29/2017   PFIZER Comirnaty(Gray Top)Covid-19 Tri-Sucrose Vaccine 02/17/2021   PFIZER(Purple Top)SARS-COV-2 Vaccination 12/03/2019, 12/28/2019, 07/26/2020   Pneumococcal Conjugate-13 03/21/2015   Pneumococcal Polysaccharide-23 04/06/2016   Tdap 10/30/2007, 05/27/2018   Zoster Recombinat (Shingrix) 03/08/2018, 05/27/2018   Zoster, Live 10/29/2012   Pertinent  Health Maintenance Due  Topic Date Due   INFLUENZA VACCINE  05/29/2021   COLONOSCOPY (Pts 45-29yr Insurance coverage  will need to be confirmed)  06/24/2024   PNA vac Low Risk Adult  Completed   Fall Risk  05/30/2021 12/01/2020 11/28/2020 05/26/2020 11/20/2019  Falls in the past year? 0 0 0 0 0  Number falls in past yr: 0 0 0 0 0  Comment - - - - -  Injury with Fall? 0 0 0 0 0  Comment - - - - -  Risk for fall due to : No Fall Risks - - - -  Follow up Falls evaluation completed - - - -   Functional Status Survey:    There were no vitals filed for this visit. There is no height or weight on file to calculate BMI. Physical Exam Vitals reviewed.  Constitutional:      General: He is not in acute distress.    Appearance: Normal appearance. He is normal weight. He is not ill-appearing or diaphoretic.  HENT:     Head: Normocephalic.     Right Ear: Tympanic membrane, ear canal and external ear normal. There is no impacted cerumen.     Left Ear: Tympanic membrane, ear canal and external ear normal. There is no impacted cerumen.     Nose: Nose normal. No congestion or rhinorrhea.     Mouth/Throat:     Mouth: Mucous membranes  are moist.     Pharynx: Oropharynx is clear. No oropharyngeal exudate or posterior oropharyngeal erythema.  Eyes:     General: No scleral icterus.       Right eye: No discharge.        Left eye: No discharge.     Extraocular Movements: Extraocular movements intact.     Conjunctiva/sclera: Conjunctivae normal.     Pupils: Pupils are equal, round, and reactive to light.  Neck:     Vascular: No carotid bruit.  Cardiovascular:     Rate and Rhythm: Normal rate and regular rhythm.     Pulses: Normal pulses.     Heart sounds: Normal heart sounds. No murmur heard.   No friction rub. No gallop.  Pulmonary:     Effort: Pulmonary effort is normal. No respiratory distress.     Breath sounds: Normal breath sounds. No wheezing, rhonchi or rales.  Chest:     Chest wall: No tenderness.  Abdominal:     General: Bowel sounds are normal. There is no distension.     Palpations: Abdomen is soft.  There is no mass.     Tenderness: There is no abdominal tenderness. There is no right CVA tenderness, left CVA tenderness, guarding or rebound.  Musculoskeletal:        General: No swelling or tenderness. Normal range of motion.     Cervical back: Normal range of motion. No rigidity or tenderness.     Right lower leg: No edema.     Left lower leg: No edema.  Lymphadenopathy:     Cervical: No cervical adenopathy.  Skin:    General: Skin is warm and dry.     Coloration: Skin is not pale.     Findings: No bruising, erythema, lesion or rash.  Neurological:     Mental Status: He is alert and oriented to person, place, and time.     Cranial Nerves: No cranial nerve deficit.     Sensory: No sensory deficit.     Motor: No weakness.     Coordination: Coordination normal.     Gait: Gait normal.  Psychiatric:        Mood and Affect: Mood normal.        Speech: Speech normal.        Behavior: Behavior normal.        Thought Content: Thought content normal.        Judgment: Judgment normal.    Labs reviewed: Recent Labs    05/30/21 0848  NA 143  K 4.1  CL 106  CO2 29  GLUCOSE 87  BUN 29*  CREATININE 1.20  CALCIUM 9.7   Recent Labs    05/30/21 0848  AST 19  ALT 17  BILITOT 0.7  PROT 6.4   Recent Labs    11/28/20 0000 05/30/21 0848  WBC 5.4 5.8  NEUTROABS 3,618 3,944  HGB 16.5 16.0  HCT 47.1 47.1  MCV 88.5 90.9  PLT 159 163   Lab Results  Component Value Date   TSH 1.21 05/30/2021   No results found for: HGBA1C Lab Results  Component Value Date   CHOL 143 05/30/2021   HDL 49 05/30/2021   LDLCALC 78 05/30/2021   TRIG 82 05/30/2021   CHOLHDL 2.9 05/30/2021    Significant Diagnostic Results in last 30 days:  No results found.  Assessment/Plan  1. Essential hypertension B/p well controlled  - continue on Hydrochlorothiazide  and Lisinopril  On ASA but not on Statin  due to allergies.  - CBC with Differential/Platelet; Future - CMP with eGFR([facility]);  Future - TSH; Future  2. Mixed Hyperlipidemia LDL at goal - continue on fenofibrate  - continue dietary modification and exercise  - Lipid panel; Future  3. Vitamin D deficiency Continue on vitamin D supplement will check levels with next lab work. - Vitamin D, 1,25-dihydroxy; Future  4. Vitamin B12 deficiency Continue on vitamin B 12 supplement will check levels with next lab work. - Vitamin B12; Future  5. Cervicalgia ?arthritic pain and positional due to Bike riding.Has not required tizanidine.Ibuprofen has been effective.Cautioned on taking NSAIDs due to CR on upper range normal.  Continue on Tizanidine as needed   Family/ staff Communication: Reviewed plan of care with patient verbalized understanding.  Labs/tests ordered:  - CBC with Differential/Platelet - CMP with eGFR([facility]) - TSH - Lipid panel - Vitamin B 12 - Vitamin D, 1,25-dihydroxy; Future  Next Appointment : 6 months for medical management of chronic issues with Dr.Gupta.Fasting Labs prior to visit.   Sandrea Hughs, NP

## 2021-06-01 ENCOUNTER — Ambulatory Visit: Payer: Medicare Other | Admitting: Internal Medicine

## 2021-06-15 DIAGNOSIS — D034 Melanoma in situ of scalp and neck: Secondary | ICD-10-CM | POA: Diagnosis not present

## 2021-06-15 DIAGNOSIS — Z85828 Personal history of other malignant neoplasm of skin: Secondary | ICD-10-CM | POA: Diagnosis not present

## 2021-06-15 DIAGNOSIS — Z85820 Personal history of malignant melanoma of skin: Secondary | ICD-10-CM | POA: Diagnosis not present

## 2021-06-15 DIAGNOSIS — C434 Malignant melanoma of scalp and neck: Secondary | ICD-10-CM | POA: Diagnosis not present

## 2021-07-14 ENCOUNTER — Other Ambulatory Visit: Payer: Self-pay | Admitting: *Deleted

## 2021-07-14 MED ORDER — HYDROCHLOROTHIAZIDE 25 MG PO TABS: 25.0000 mg | ORAL_TABLET | Freq: Every day | ORAL | 3 refills | Status: DC

## 2021-07-14 MED ORDER — LISINOPRIL 40 MG PO TABS: 40.0000 mg | ORAL_TABLET | Freq: Every day | ORAL | 3 refills | Status: DC

## 2021-07-14 MED ORDER — FENOFIBRATE 160 MG PO TABS: 160.0000 mg | ORAL_TABLET | Freq: Every day | ORAL | 3 refills | Status: DC

## 2021-07-14 NOTE — Telephone Encounter (Signed)
Patient requested refills to be sent to [facility].

## 2021-07-19 DIAGNOSIS — Z23 Encounter for immunization: Secondary | ICD-10-CM | POA: Diagnosis not present

## 2021-08-03 DIAGNOSIS — Z23 Encounter for immunization: Secondary | ICD-10-CM | POA: Diagnosis not present

## 2021-08-29 NOTE — Progress Notes (Signed)
   I, Wendy Poet, LAT, ATC, am serving as scribe for Dr. Lynne Leader.  Brandon [name] is a 75 y.o. male who presents to [place] at [facility] today for L hip pain that began on 08/28/21 while playing pickleball when he stepped laterally to get a ball and felt a pop in his L hip.  He locates his pain to his L lateral hip/GT .  Radiating pain: No L hip mechanical symptoms: No Aggravating factors: lateral movement;  Treatments tried: Nothing  Recently moved to [place].  Physically active participates in cycling, swimming, and recently started playing pickle ball.  Pertinent review of systems: no fever or chills  Relevant historical information: Thrombocytopenia.  Hypertension.   Exam:  BP 130/78 (BP Location: Right Arm, Patient Position: Sitting, Cuff Size: Normal)   Pulse 67   Ht 6\' 1"  (1.854 m)   Wt 195 lb 9.6 oz (88.7 kg)   SpO2 97%   BMI 25.81 kg/m  General: Well Developed, well nourished, and in no acute distress.   MSK: Left hip normal-appearing Tender palpation greater trochanter. Normal hip motion. Hip abduction strength diminished 4/5.  External rotation strength diminished 4/5.  Both produce some pain.  Right hip normal-appearing normal motion nontender hip abduction and external rotation strength are mildly diminished 4+/5   Normal gait without significant pain.  ]   Assessment and Plan: 75 y.o. male with left lateral hip pain occurring 2 days ago while playing pickle ball.  Pain due to strain or injury of the hip abductor musculature or tendon in the left lateral hip.  He is already improving a bit but does have some significant weakness of the hip abductors and external rotators.  He is an excellent candidate for physical therapy.  Plan to refer to physical therapy at his residence at [facility].  Additionally ATC spent a fair amount of time today teaching home exercise program  [phone]; 15 additional minutes spent for  Therapeutic exercises as stated in above notes.  This included exercises focusing on stretching, strengthening, with significant focus on eccentric aspects.   Long term goals include an improvement in range of motion, strength, endurance as well as avoiding reinjury. Patient's frequency would include in 1-2 times a day, 3-5 times a week for a duration of 6-12 weeks.  Proper technique shown and discussed handout in great detail with ATC.  All questions were discussed and answered.   Recheck in 6 weeks if needed otherwise communicate if having difficulty or problems.  PDMP not reviewed this encounter. Orders Placed This Encounter  Procedures   Ambulatory referral to Physical Therapy    Referral Priority:   Routine    Referral Type:   Physical Medicine    Referral Reason:   Specialty Services Required    Requested Specialty:   Physical Therapy    Number of Visits Requested:   1   No orders of the defined types were placed in this encounter.    Discussed warning signs or symptoms. Please see discharge instructions. Patient expresses understanding.   The above documentation has been reviewed and is accurate and complete Lynne Leader, M.D.

## 2021-08-30 ENCOUNTER — Other Ambulatory Visit: Payer: Self-pay

## 2021-08-30 ENCOUNTER — Encounter: Payer: Self-pay | Admitting: Family Medicine

## 2021-08-30 ENCOUNTER — Ambulatory Visit (INDEPENDENT_AMBULATORY_CARE_PROVIDER_SITE_OTHER): Payer: Medicare Other | Admitting: Family Medicine

## 2021-08-30 ENCOUNTER — Ambulatory Visit: Payer: Self-pay

## 2021-08-30 VITALS — BP 130/78 | HR 67 | Ht 73.0 in | Wt 195.6 lb

## 2021-08-30 DIAGNOSIS — M25552 Pain in left hip: Secondary | ICD-10-CM

## 2021-08-30 NOTE — Patient Instructions (Addendum)
Nice to meet you today.  Please perform the exercise program that we have prepared for you and gone over in detail on a daily basis.  In addition to the handout you were provided you can access your program through: [email or web address]   Your unique program code is:  [email or web address]  I've referred you to Physical Therapy.  Please let us know if you haven't heard from them in one week regarding scheduling.   Follow-up: 6 weeks

## 2021-10-05 ENCOUNTER — Ambulatory Visit: Payer: Medicare Other | Admitting: Family Medicine

## 2021-10-11 ENCOUNTER — Ambulatory Visit: Payer: Medicare Other | Admitting: Family Medicine

## 2021-10-12 ENCOUNTER — Ambulatory Visit: Payer: Medicare Other | Admitting: Family Medicine

## 2021-10-16 ENCOUNTER — Encounter: Payer: Self-pay | Admitting: Orthopedic Surgery

## 2021-10-16 ENCOUNTER — Ambulatory Visit (INDEPENDENT_AMBULATORY_CARE_PROVIDER_SITE_OTHER): Payer: Medicare Other | Admitting: Orthopedic Surgery

## 2021-10-16 ENCOUNTER — Other Ambulatory Visit: Payer: Self-pay

## 2021-10-16 VITALS — BP 138/80 | HR 67 | Temp 97.9°F | Resp 16 | Ht 73.0 in

## 2021-10-16 DIAGNOSIS — J029 Acute pharyngitis, unspecified: Secondary | ICD-10-CM | POA: Diagnosis not present

## 2021-10-16 DIAGNOSIS — R0981 Nasal congestion: Secondary | ICD-10-CM | POA: Diagnosis not present

## 2021-10-16 DIAGNOSIS — J0110 Acute frontal sinusitis, unspecified: Secondary | ICD-10-CM | POA: Diagnosis not present

## 2021-10-16 DIAGNOSIS — R059 Cough, unspecified: Secondary | ICD-10-CM

## 2021-10-16 LAB — POCT INFLUENZA A/B
Influenza A, POC: NEGATIVE
Influenza B, POC: NEGATIVE

## 2021-10-16 MED ORDER — AZITHROMYCIN 250 MG PO TABS: ORAL_TABLET | ORAL | 0 refills | Status: AC

## 2021-10-16 MED ORDER — PREDNISONE 10 MG PO TABS: ORAL_TABLET | ORAL | 0 refills | Status: AC

## 2021-10-16 NOTE — Progress Notes (Signed)
Careteam: Patient Care Team: Ngetich, Nelda Bucks, NP as PCP - General (Family Medicine)  Seen by: Windell Moulding, AGNP-C  PLACE OF SERVICE:  [facility] Directive information Does Patient Have a Medical Advance Directive?: Yes, Type of Advance Directive: [place];Living will;Out of facility DNR (pink MOST or yellow form), Does patient want to make changes to medical advance directive?: No - Patient declined  Allergies  Allergen Reactions   Zocor [Simvastatin] Other (See Comments)    Memory loss   Niaspan Durene Cal Er]     Chief Complaint  Patient presents with   Acute Visit    Patient complains of coughing, congestion, and sore throat. Symptoms started 1 week ago.      HPI: Patient is a 75 y.o. male seen today for acute visit due to cold symptoms.   Symptoms of nasal congestion, cough and sore throat began one week ago. Sore throat last one day. On day 2, cough began. He tested negative for covid last week. Today, he still has nasal congestion and cough. Nasal congestion is thick, sometimes yellow. Also having facial tenderness around sinuses.Cough sometimes productive with white/yellow sputum. Cough feels like tickle at times. Cough worse in AM and PM. He is able to sleep at night. He tried using spouses hydrocodone cough syrup twice without success. Denies fever, shortness of breath and malaise. He is up to date on flu vaccine and covid vaccine.      Review of Systems:  Review of Systems  Constitutional:  Negative for chills, fever, malaise/fatigue and weight loss.  HENT:  Positive for congestion and sinus pain. Negative for ear discharge, ear pain and sore throat.   Eyes:  Negative for discharge and redness.  Respiratory:  Positive for cough and sputum production. Negative for shortness of breath and wheezing.   Cardiovascular:  Negative for chest pain and leg swelling.  Gastrointestinal:  Negative for diarrhea, heartburn, nausea and vomiting.   Musculoskeletal:  Negative for myalgias.  Neurological:  Negative for dizziness and headaches.  Psychiatric/Behavioral:  Negative for depression. The patient is not nervous/anxious.   Past Medical History:  Diagnosis Date   Cataract    Cervicalgia 03/29/2015   History of colonic polyps    Hyperlipidemia    Hypertension    Thrombocytopenia ([place])    Vitamin D deficiency    Past Surgical History:  Procedure Laterality Date   ARM SKIN LESION BIOPSY / EXCISION Left 02/2016   atypical lentigmous and nested melanocytic proliferation--Dr. Harriett Sine   CATARACT EXTRACTION Bilateral january and february 2018   Social History:   reports that he has never smoked. He has never used smokeless tobacco. He reports current alcohol use of about 1.0 standard drink per week. He reports that he does not use drugs.  Family History  Problem Relation Age of Onset   Clotting disorder Father    Suicidality Father    High Cholesterol Mother    Renal Disease Mother    Stroke Mother 63   Colon cancer Neg Hx    Esophageal cancer Neg Hx    Stomach cancer Neg Hx    Rectal cancer Neg Hx     Medications: Patient's Medications  New Prescriptions   No medications on file  Previous Medications   ASPIRIN EC 81 MG TABLET    Take 81 mg by mouth daily.   CHOLECALCIFEROL (VITAMIN D3) 1000 UNITS CAPS    Take by mouth daily.   FENOFIBRATE 160 MG TABLET  Take 1 tablet (160 mg total) by mouth daily.   HYDROCHLOROTHIAZIDE (HYDRODIURIL) 25 MG TABLET    Take 1 tablet (25 mg total) by mouth daily.   LISINOPRIL (ZESTRIL) 40 MG TABLET    Take 1 tablet (40 mg total) by mouth daily.   TIZANIDINE (ZANAFLEX) 2 MG CAPSULE    Take 2 mg by mouth 3 (three) times daily as needed for muscle spasms.   VITAMIN B-12 (CYANOCOBALAMIN) 1000 MCG TABLET    Take 1,000 mcg by mouth daily.  Modified Medications   No medications on file  Discontinued Medications   No medications on file    Physical Exam:  Vitals:   10/16/21  1355  BP: 138/80  Pulse: 67  Resp: 16  Temp: 97.9 F (36.6 C)  SpO2: 97%  Height: 6\' 1"  (1.854 m)   Body mass index is 25.81 kg/m. Wt Readings from Last 3 Encounters:  08/30/21 195 lb 9.6 oz (88.7 kg)  05/31/21 188 lb (85.3 kg)  12/01/20 189 lb 6.4 oz (85.9 kg)    Physical Exam Vitals reviewed.  Constitutional:      General: He is not in acute distress. HENT:     Head: Normocephalic.     Right Ear: There is no impacted cerumen.     Left Ear: There is no impacted cerumen.     Nose:     Right Turbinates: Enlarged.     Left Turbinates: Enlarged.     Mouth/Throat:     Mouth: Mucous membranes are moist.     Pharynx: No oropharyngeal exudate or posterior oropharyngeal erythema.  Eyes:     General:        Right eye: No discharge.        Left eye: No discharge.  Neck:     Vascular: No carotid bruit.  Cardiovascular:     Rate and Rhythm: Normal rate and regular rhythm.     Pulses: Normal pulses.     Heart sounds: Normal heart sounds. No murmur heard. Pulmonary:     Effort: No respiratory distress.     Breath sounds: No wheezing or rhonchi.  Abdominal:     General: Bowel sounds are normal. There is no distension.     Palpations: Abdomen is soft.     Tenderness: There is no abdominal tenderness.  Musculoskeletal:     Cervical back: Normal range of motion.     Right lower leg: No edema.     Left lower leg: No edema.  Lymphadenopathy:     Cervical: No cervical adenopathy.  Skin:    General: Skin is warm and dry.     Capillary Refill: Capillary refill takes less than 2 seconds.  Neurological:     General: No focal deficit present.     Mental Status: He is alert and oriented to person, place, and time.  Psychiatric:        Mood and Affect: Mood normal.    Labs reviewed: Basic Metabolic Panel: Recent Labs    05/30/21 0848  NA 143  K 4.1  CL 106  CO2 29  GLUCOSE 87  BUN 29*  CREATININE 1.20  CALCIUM 9.7  TSH 1.21   Liver Function Tests: Recent Labs     05/30/21 0848  AST 19  ALT 17  BILITOT 0.7  PROT 6.4   No results for input(s): LIPASE, AMYLASE in the last 8760 hours. No results for input(s): AMMONIA in the last 8760 hours. CBC: Recent Labs    11/28/20 0000  05/30/21 0848  WBC 5.4 5.8  NEUTROABS 3,618 3,944  HGB 16.5 16.0  HCT 47.1 47.1  MCV 88.5 90.9  PLT 159 163   Lipid Panel: Recent Labs    05/30/21 0848  CHOL 143  HDL 49  LDLCALC 78  TRIG 82  CHOLHDL 2.9   TSH: Recent Labs    05/30/21 0848  TSH 1.21   A1C: No results found for: HGBA1C   Assessment/Plan 1. Acute non-recurrent frontal sinusitis - symptoms of cough, nasal congestion and cough started last week - nasal turbinates swollen, frontal sinus pain  - recommend starting Claritin or Zyrtec daily x 2 weeks - SARS-COV-2 RNA,(COVID-19) QUAL NAAT - POC Influenza A/B- negative - azithromycin (ZITHROMAX) 250 MG tablet; Take 2 tablets on day 1, then 1 tablet daily on days 2 through 5  Dispense: 6 tablet; Refill: 0  2. Cough, unspecified type - sometimes productive - exam unremarkable - SARS-COV-2 RNA,(COVID-19) QUAL NAAT - POC Influenza A/B- negative - predniSONE (DELTASONE) 10 MG tablet; Take 4 tablets (40 mg total) by mouth daily with breakfast for 1 day, THEN 3 tablets (30 mg total) daily with breakfast for 1 day, THEN 2 tablets (20 mg total) daily with breakfast for 1 day, THEN 1 tablet (10 mg total) daily with breakfast for 1 day, THEN 0.5 tablets (5 mg total) daily with breakfast for 1 day.  Dispense: 10.5 tablet; Refill: 0  Total time: 21 minutes. Greater than 50% of total time spent doing patient education on symptom/medication management regarding cough and nasal congestion.    Next appt: none Cattie Tineo [place], [facility] [phone]

## 2021-10-16 NOTE — Patient Instructions (Signed)
May try Claritin or Zyrtec- 1 pill daily for 2 weeks, will help dry up sinuses  If cough worsens- start prednisone  Please finish antibiotic

## 2021-10-16 NOTE — Progress Notes (Deleted)
° °  I, Wendy Poet, LAT, ATC, am serving as scribe for Dr. Lynne Leader.  Firas Guardado is a 75 y.o. male who presents to [place] at [facility] today for f/u of L lateral hip pain that began on 08/28/21 when he felt a pop in his hip while playing pickleball.  He was last seen by Dr.Corey on 08/30/21 and was shown a HEP focusing on hip aBd and glute strengthening.  He was also referred to PT at [facility].  Today, pt reports    Pertinent review of systems: ***  Relevant historical information: ***   Exam:  There were no vitals taken for this visit. General: Well Developed, well nourished, and in no acute distress.   MSK: ***    Lab and Radiology Results No results found for this or any previous visit (from the past 72 hour(s)). No results found.     Assessment and Plan: 76 y.o. male with ***   PDMP not reviewed this encounter. No orders of the defined types were placed in this encounter.  No orders of the defined types were placed in this encounter.    Discussed warning signs or symptoms. Please see discharge instructions. Patient expresses understanding.   ***

## 2021-10-17 LAB — SARS-COV-2 RNA,(COVID-19) QUALITATIVE NAAT: SARS CoV2 RNA: NOT DETECTED

## 2021-10-19 ENCOUNTER — Ambulatory Visit: Payer: Medicare Other | Admitting: Family Medicine

## 2021-11-27 DIAGNOSIS — H401411 Capsular glaucoma with pseudoexfoliation of lens, right eye, mild stage: Secondary | ICD-10-CM | POA: Diagnosis not present

## 2021-11-30 DIAGNOSIS — Z85828 Personal history of other malignant neoplasm of skin: Secondary | ICD-10-CM | POA: Diagnosis not present

## 2021-11-30 DIAGNOSIS — L570 Actinic keratosis: Secondary | ICD-10-CM | POA: Diagnosis not present

## 2021-11-30 DIAGNOSIS — L814 Other melanin hyperpigmentation: Secondary | ICD-10-CM | POA: Diagnosis not present

## 2021-11-30 DIAGNOSIS — D1801 Hemangioma of skin and subcutaneous tissue: Secondary | ICD-10-CM | POA: Diagnosis not present

## 2021-11-30 DIAGNOSIS — D485 Neoplasm of uncertain behavior of skin: Secondary | ICD-10-CM | POA: Diagnosis not present

## 2021-11-30 DIAGNOSIS — Z85820 Personal history of malignant melanoma of skin: Secondary | ICD-10-CM | POA: Diagnosis not present

## 2021-11-30 DIAGNOSIS — D2272 Melanocytic nevi of left lower limb, including hip: Secondary | ICD-10-CM | POA: Diagnosis not present

## 2021-11-30 DIAGNOSIS — D225 Melanocytic nevi of trunk: Secondary | ICD-10-CM | POA: Diagnosis not present

## 2021-12-01 ENCOUNTER — Other Ambulatory Visit: Payer: Self-pay

## 2021-12-01 ENCOUNTER — Encounter: Payer: Self-pay | Admitting: Family

## 2021-12-01 ENCOUNTER — Ambulatory Visit (INDEPENDENT_AMBULATORY_CARE_PROVIDER_SITE_OTHER): Payer: Medicare Other | Admitting: Family

## 2021-12-01 DIAGNOSIS — Z0000 Encounter for general adult medical examination without abnormal findings: Secondary | ICD-10-CM | POA: Diagnosis not present

## 2021-12-01 NOTE — Patient Instructions (Signed)
Brandon [name] , Thank you for taking time to come for your Medicare Wellness Visit. I appreciate your ongoing commitment to your health goals. Please review the following plan we discussed and let me know if I can assist you in the future.   Screening recommendations/referrals: Colonoscopy : Up to date Recommended yearly ophthalmology/optometry visit for glaucoma screening and checkup Recommended yearly dental visit for hygiene and checkup  Vaccinations: Influenza vaccine :Up to date  Pneumococcal vaccine :Up to date Tdap vaccine :Up to date Shingles vaccine :Up to date    Advanced directives: Yes   Conditions/risks identified: Advance age male > 81 yrs ,Male Gender ,Hypertension   Next appointment: 1 year   Preventive Care 73 Years and Older, Male Preventive care refers to lifestyle choices and visits with your health care provider that can promote health and wellness. What does preventive care include? A yearly physical exam. This is also called an annual well check. Dental exams once or twice a year. Routine eye exams. Ask your health care provider how often you should have your eyes checked. Personal lifestyle choices, including: Daily care of your teeth and gums. Regular physical activity. Eating a healthy diet. Avoiding tobacco and drug use. Limiting alcohol use. Practicing safe sex. Taking low doses of aspirin every day. Taking vitamin and mineral supplements as recommended by your health care provider. What happens during an annual well check? The services and screenings done by your health care provider during your annual well check will depend on your age, overall health, lifestyle risk factors, and family history of disease. Counseling  Your health care provider may ask you questions about your: Alcohol use. Tobacco use. Drug use. Emotional well-being. Home and relationship well-being. Sexual activity. Eating habits. History of falls. Memory and ability to  understand (cognition). Work and work Statistician. Screening  You may have the following tests or measurements: Height, weight, and BMI. Blood pressure. Lipid and cholesterol levels. These may be checked every 5 years, or more frequently if you are over 54 years old. Skin check. Lung cancer screening. You may have this screening every year starting at age 28 if you have a 30-pack-year history of smoking and currently smoke or have quit within the past 15 years. Fecal occult blood test (FOBT) of the stool. You may have this test every year starting at age 55. Flexible sigmoidoscopy or colonoscopy. You may have a sigmoidoscopy every 5 years or a colonoscopy every 10 years starting at age 22. Prostate cancer screening. Recommendations will vary depending on your family history and other risks. Hepatitis C blood test. Hepatitis B blood test. Sexually transmitted disease (STD) testing. Diabetes screening. This is done by checking your blood sugar (glucose) after you have not eaten for a while (fasting). You may have this done every 1-3 years. Abdominal aortic aneurysm (AAA) screening. You may need this if you are a current or former smoker. Osteoporosis. You may be screened starting at age 6 if you are at high risk. Talk with your health care provider about your test results, treatment options, and if necessary, the need for more tests. Vaccines  Your health care provider may recommend certain vaccines, such as: Influenza vaccine. This is recommended every year. Tetanus, diphtheria, and acellular pertussis (Tdap, Td) vaccine. You may need a Td booster every 10 years. Zoster vaccine. You may need this after age 11. Pneumococcal 13-valent conjugate (PCV13) vaccine. One dose is recommended after age 46. Pneumococcal polysaccharide (PPSV23) vaccine. One dose is recommended after age 24.  Talk to your health care provider about which screenings and vaccines you need and how often you need them. This  information is not intended to replace advice given to you by your health care provider. Make sure you discuss any questions you have with your health care provider. Document Released: 11/11/2015 Document Revised: 07/04/2016 Document Reviewed: 08/16/2015 Elsevier Interactive Patient Education  2017 [employer] Prevention in the Home Falls can cause injuries. They can happen to people of all ages. There are many things you can do to make your home safe and to help prevent falls. What can I do on the outside of my home? Regularly fix the edges of walkways and driveways and fix any cracks. Remove anything that might make you trip as you walk through a door, such as a raised step or threshold. Trim any bushes or trees on the path to your home. Use bright outdoor lighting. Clear any walking paths of anything that might make someone trip, such as rocks or tools. Regularly check to see if handrails are loose or broken. Make sure that both sides of any steps have handrails. Any raised decks and porches should have guardrails on the edges. Have any leaves, snow, or ice cleared regularly. Use sand or salt on walking paths during winter. Clean up any spills in your garage right away. This includes oil or grease spills. What can I do in the bathroom? Use night lights. Install grab bars by the toilet and in the tub and shower. Do not use towel bars as grab bars. Use non-skid mats or decals in the tub or shower. If you need to sit down in the shower, use a plastic, non-slip stool. Keep the floor dry. Clean up any water that spills on the floor as soon as it happens. Remove soap buildup in the tub or shower regularly. Attach bath mats securely with double-sided non-slip rug tape. Do not have throw rugs and other things on the floor that can make you trip. What can I do in the bedroom? Use night lights. Make sure that you have a light by your bed that is easy to reach. Do not use any sheets or  blankets that are too big for your bed. They should not hang down onto the floor. Have a firm chair that has side arms. You can use this for support while you get dressed. Do not have throw rugs and other things on the floor that can make you trip. What can I do in the kitchen? Clean up any spills right away. Avoid walking on wet floors. Keep items that you use a lot in easy-to-reach places. If you need to reach something above you, use a strong step stool that has a grab bar. Keep electrical cords out of the way. Do not use floor polish or wax that makes floors slippery. If you must use wax, use non-skid floor wax. Do not have throw rugs and other things on the floor that can make you trip. What can I do with my stairs? Do not leave any items on the stairs. Make sure that there are handrails on both sides of the stairs and use them. Fix handrails that are broken or loose. Make sure that handrails are as long as the stairways. Check any carpeting to make sure that it is firmly attached to the stairs. Fix any carpet that is loose or worn. Avoid having throw rugs at the top or bottom of the stairs. If you do have throw  rugs, attach them to the floor with carpet tape. Make sure that you have a light switch at the top of the stairs and the bottom of the stairs. If you do not have them, ask someone to add them for you. What else can I do to help prevent falls? Wear shoes that: Do not have high heels. Have rubber bottoms. Are comfortable and fit you well. Are closed at the toe. Do not wear sandals. If you use a stepladder: Make sure that it is fully opened. Do not climb a closed stepladder. Make sure that both sides of the stepladder are locked into place. Ask someone to hold it for you, if possible. Clearly mark and make sure that you can see: Any grab bars or handrails. First and last steps. Where the edge of each step is. Use tools that help you move around (mobility aids) if they are  needed. These include: Canes. Walkers. Scooters. Crutches. Turn on the lights when you go into a dark area. Replace any light bulbs as soon as they burn out. Set up your furniture so you have a clear path. Avoid moving your furniture around. If any of your floors are uneven, fix them. If there are any pets around you, be aware of where they are. Review your medicines with your doctor. Some medicines can make you feel dizzy. This can increase your chance of falling. Ask your doctor what other things that you can do to help prevent falls. This information is not intended to replace advice given to you by your health care provider. Make sure you discuss any questions you have with your health care provider. Document Released: 08/11/2009 Document Revised: 03/22/2016 Document Reviewed: 11/19/2014 Elsevier Interactive Patient Education  2017 [employer].

## 2021-12-01 NOTE — Progress Notes (Signed)
This service is provided via telemedicine  No vital signs collected/recorded due to the encounter was a telemedicine visit.   Location of patient (ex: home, work):  Home  Patient consents to a telephone visit:  Yes  Location of the provider (ex: office, home):  [employer].  Name of any referring provider:  Harlan Vinal, Nelda Bucks, NP   Names of all persons participating in the telemedicine service and their role in the encounter:  Patient, Heriberto [place], [place], [place], Webb Silversmith, NP.    Time spent on call: 8 minutes spent on the phone with Medical Assistant.     Subjective:   Brandon [name] is a 76 y.o. male who presents for Medicare Annual/Subsequent preventive examination.  Review of Systems     Cardiac Risk Factors include: advanced age (>76men, >49 women);male gender;hypertension     Objective:    Today's Vitals   12/01/21 1131  PainSc: 3    There is no height or weight on file to calculate BMI.  Advanced Directives 12/01/2021 10/16/2021 12/01/2020 11/28/2020 05/26/2020 11/20/2019 08/24/2019  Does Patient Have a Medical Advance Directive? Yes Yes Yes Yes Yes Yes Yes  Type of Paramedic of [place];Living will;Out of facility DNR (pink MOST or yellow form) [place];Living will;Out of facility DNR (pink MOST or yellow form) [place];Out of facility DNR (pink MOST or yellow form);Living will [place];Out of facility DNR (pink MOST or yellow form);Living will Out of facility DNR (pink MOST or yellow form) [place];Living will;Out of facility DNR (pink MOST or yellow form) Out of facility DNR (pink MOST or yellow form);[place];Living will  Does patient want to make changes to medical advance directive? No - Patient declined No - Patient declined No - Patient declined No - Patient declined No - Patient declined No - Patient declined No - Patient declined   Copy of [place] in Chart? Yes - validated most recent copy scanned in chart (See row information) Yes - validated most recent copy scanned in chart (See row information) Yes - validated most recent copy scanned in chart (See row information) Yes - validated most recent copy scanned in chart (See row information) - Yes - validated most recent copy scanned in chart (See row information) Yes - validated most recent copy scanned in chart (See row information)  Pre-existing out of facility DNR order (yellow form or pink MOST form) - - - - - Yellow form placed in chart (order not valid for inpatient use) Yellow form placed in chart (order not valid for inpatient use)    Current Medications (verified) Outpatient Encounter Medications as of 12/01/2021  Medication Sig   aspirin EC 81 MG tablet Take 81 mg by mouth daily.   Cholecalciferol (VITAMIN D3) 1000 units CAPS Take by mouth daily.   fenofibrate 160 MG tablet Take 1 tablet (160 mg total) by mouth daily.   hydrochlorothiazide (HYDRODIURIL) 25 MG tablet Take 1 tablet (25 mg total) by mouth daily.   lisinopril (ZESTRIL) 40 MG tablet Take 1 tablet (40 mg total) by mouth daily.   vitamin B-12 (CYANOCOBALAMIN) 1000 MCG tablet Take 1,000 mcg by mouth daily.   [DISCONTINUED] tizanidine (ZANAFLEX) 2 MG capsule Take 2 mg by mouth 3 (three) times daily as needed for muscle spasms.   No facility-administered encounter medications on file as of 12/01/2021.    Allergies (verified) Zocor [simvastatin] and Niaspan [niacin er]   History:  Past Medical History:  Diagnosis Date   Cataract    Cervicalgia 03/29/2015   History of colonic polyps    Hyperlipidemia    Hypertension    Thrombocytopenia (HCC)    Vitamin D deficiency    Past Surgical History:  Procedure Laterality Date   ARM SKIN LESION BIOPSY / EXCISION Left 02/2016   atypical lentigmous and nested melanocytic proliferation--Dr. Harriett Sine   CATARACT EXTRACTION Bilateral  january and february 2018   Family History  Problem Relation Age of Onset   Clotting disorder Father    Suicidality Father    High Cholesterol Mother    Renal Disease Mother    Stroke Mother 66   Colon cancer Neg Hx    Esophageal cancer Neg Hx    Stomach cancer Neg Hx    Rectal cancer Neg Hx    Social History   Socioeconomic History   Marital status: Married    Spouse name: Not on file   Number of children: Not on file   Years of education: Not on file   Highest education level: Not on file  Occupational History   Not on file  Tobacco Use   Smoking status: Never   Smokeless tobacco: Never  Vaping Use   Vaping Use: Never used  Substance and Sexual Activity   Alcohol use: Yes    Alcohol/week: 1.0 standard drink    Types: 1 Cans of beer per week    Comment: every 6 months    Drug use: No   Sexual activity: Not Currently  Other Topics Concern   Not on file  Social History Narrative   Diet- well balanced   Caffeine- Yes (coffee)   Married- Yes, [place], 1 story with 2 persons   Pets- 1 cat   Current/Past profession- Gaffer   Exercise- Yes ( walking and aerobics)   Living Will- Yes   DNR- N/A   POA/HPOA- N/A      Social Determinants of Health   Financial Resource Strain: Not on file  Food Insecurity: Not on file  Transportation Needs: Not on file  Physical Activity: Not on file  Stress: Not on file  Social Connections: Not on file    Tobacco Counseling Counseling given: Not Answered   Clinical Intake:  Pre-visit preparation completed: No  Pain : 0-10 Pain Score: 3  Pain Type: Acute pain Pain Location: Wrist Pain Orientation: Right Pain Radiating Towards: No Pain Descriptors / Indicators: Aching Pain Onset:  (2-3 weeks) Pain Frequency: Intermittent Pain Relieving Factors: rest Effect of Pain on Daily Activities: yes  Pain Relieving Factors: rest  BMI - recorded: 25.81 Nutritional Status: BMI 25 -29  Overweight Nutritional Risks: None Diabetes: No  How often do you need to have someone help you when you read instructions, pamphlets, or other written materials from your doctor or pharmacy?: 1 - Never What is the last grade level you completed in school?: Masters degree  Diabetic?No   Interpreter Needed?: No  Information entered by :: Jadelyn Elks,FNP-C   Activities of Daily Living In your present state of health, do you have any difficulty performing the following activities: 12/01/2021  Hearing? N  Vision? N  Difficulty concentrating or making decisions? Y  Comment [place] names  Walking or climbing stairs? N  Dressing or bathing? N  Doing errands, shopping? N  Preparing Food and eating ? N  Using the Toilet? N  In the past six months, have you accidently leaked urine? N  Do you have problems with loss of bowel control? N  Managing your Medications? N  Managing your Finances? N  Housekeeping or managing your Housekeeping? N  Some recent data might be hidden    Patient Care Team: Aveen Stansel, Nelda Bucks, NP as PCP - General (Family Medicine)  Indicate any recent Medical Services you may have received from other than [facility] providers in the past year (date may be approximate).     Assessment:   This is a routine wellness examination for Brandon [name].  Hearing/Vision screen Hearing Screening - Comments:: No Hearing Concerns.  Vision Screening - Comments:: No Vision Concerns. Patient doesn't wear prescription glasses. Patient last eye exam January 2023.  Dietary issues and exercise activities discussed: Current Exercise Habits: Home exercise routine, Type of exercise: walking;strength training/weights (biking and swiming), Time (Minutes): 60, Frequency (Times/Week): 3, Weekly Exercise (Minutes/Week): 180, Intensity: Moderate, Exercise limited by: None identified   Goals Addressed             This Visit's Progress    <enter goal here>   On track    Starting 10/25/16, I  will maintain my current exercise lifestyle of biking 50 miles per week.      Exercise 3x per week (30 min per time)   On track    I will continue with my biking.      Patient Stated   On track    He wants to bike more throughout the week to do a longer biking trip eventually.       Depression Screen PHQ 2/9 Scores 12/01/2021 11/28/2020 05/26/2020 11/20/2019 05/21/2019 11/17/2018 11/17/2018  PHQ - 2 Score 0 0 0 0 0 0 0    Fall Risk Fall Risk  12/01/2021 10/16/2021 05/30/2021 12/01/2020 11/28/2020  Falls in the past year? 1 0 0 0 0  Comment playing pickle ball. - - - -  Number falls in past yr: 1 0 0 0 0  Comment - - - - -  Injury with Fall? 1 0 0 0 0  Comment - - - - -  Risk for fall due to : History of fall(s) No Fall Risks No Fall Risks - -  Follow up Falls evaluation completed;Education provided;Falls prevention discussed Falls evaluation completed Falls evaluation completed - -    FALL RISK PREVENTION PERTAINING TO THE HOME:  Any stairs in or around the home? No  If so, are there any without handrails? No  Home free of loose throw rugs in walkways, pet beds, electrical cords, etc? No  Adequate lighting in your home to reduce risk of falls? Yes   ASSISTIVE DEVICES UTILIZED TO PREVENT FALLS:  Life alert? No  Use of a cane, walker or w/c? No  Grab bars in the bathroom? Yes  Shower chair or bench in shower? No  Elevated toilet seat or a handicapped toilet? Yes   TIMED UP AND GO:  Was the test performed? No .  Length of time to ambulate 10 feet: N/A  sec.   Gait steady and fast without use of assistive device  Cognitive Function: MMSE - Mini Mental State Exam 11/28/2020 11/17/2018 10/28/2017 10/25/2016 03/21/2015  Orientation to time 5 5 5 5 5   Orientation to time comments 2022, Winter, 31st, Monday, January. - - - -  Orientation to Place 5 4 5 5 5   Orientation to Place-comments [place], [place], [place], [employer]. - - - -  Registration 3 3 3 3 3   Attention/  Calculation 5 5 5  5  5  Recall 3 3 3 3 3   Language- name 2 objects 2 2 2 2 2   Language- repeat 1 1 1 1 1   Language- follow 3 step command 3 3 3 2 3   Language- read & follow direction 0 1 1 1 1   Write a sentence 1 1 1 1 1   Copy design 1 1 1 1 1   Total score 29 29 30 29 30      6CIT Screen 12/01/2021 11/20/2019  What Year? 0 points 0 points  What month? 0 points 0 points  What time? 0 points 0 points  Count back from 20 0 points 0 points  Months in reverse 0 points 0 points  Repeat phrase 0 points 0 points  Total Score 0 0    Immunizations Immunization History  Administered Date(s) Administered   Fluad Quad(high Dose 65+) 06/23/2019   Influenza, High Dose Seasonal PF 08/03/2021   Influenza,inj,Quad PF,6+ Mos 07/25/2015, 08/03/2016, 07/29/2018   Influenza-Unspecified 08/29/2017   PFIZER Comirnaty(Gray Top)Covid-19 Tri-Sucrose Vaccine 02/17/2021   PFIZER(Purple Top)SARS-COV-2 Vaccination 12/03/2019, 12/28/2019, 07/26/2020   Pneumococcal Conjugate-13 03/21/2015   Pneumococcal Polysaccharide-23 04/06/2016   Tdap 10/30/2007, 05/27/2018   Zoster Recombinat (Shingrix) 03/08/2018, 05/27/2018   Zoster, Live 10/29/2012    TDAP status: Up to date  Flu Vaccine status: Up to date  Pneumococcal vaccine status: Up to date  Covid-19 vaccine status: Information provided on how to obtain vaccines.   Qualifies for Shingles Vaccine? Yes   Zostavax completed Yes   Shingrix Completed?: Yes  Screening Tests Health Maintenance  Topic Date Due   COVID-19 Vaccine (5 - Booster for Pfizer series) 04/14/2021   COLONOSCOPY (Pts 45-44yrs Insurance coverage will need to be confirmed)  06/24/2024   TETANUS/TDAP  05/27/2028   Pneumonia Vaccine 68+ Years old  Completed   INFLUENZA VACCINE  Completed   Hepatitis C Screening  Completed   Zoster Vaccines- Shingrix  Completed   HPV VACCINES  Aged Out    Health Maintenance  Health Maintenance Due  Topic Date Due   COVID-19 Vaccine (5 - Booster for  [place] series) 04/14/2021    Colorectal cancer screening: Type of screening: Colonoscopy. Completed 06/25/2019. Repeat every 5 years  Lung Cancer Screening: (Low Dose CT Chest recommended if Age 47-80 years, 30 pack-year currently smoking OR have quit w/in 15years.) does not qualify.   Lung Cancer Screening Referral: No  Additional Screening:  Hepatitis C Screening: does qualify; Completed yes  Vision Screening: Recommended annual ophthalmology exams for early detection of glaucoma and other disorders of the eye. Is the patient up to date with their annual eye exam?  Yes  Who is the provider or what is the name of the office in which the patient attends annual eye exams? Dr.Wiles  If pt is not established with a provider, would they like to be referred to a provider to establish care? No .   Dental Screening: Recommended annual dental exams for proper oral hygiene  Community Resource Referral / Chronic Care Management: CRR required this visit?  No   CCM required this visit?  No      Plan:     I have personally reviewed and noted the following in the patients chart:   Medical and social history Use of alcohol, tobacco or illicit drugs  Current medications and supplements including opioid prescriptions. Patient is not currently taking opioid prescriptions. Functional ability and status Nutritional status Physical activity Advanced directives List of other physicians Hospitalizations, surgeries, and ER visits in  previous 12 months Vitals Screenings to include cognitive, depression, and falls Referrals and appointments  In addition, I have reviewed and discussed with patient certain preventive protocols, quality metrics, and best practice recommendations. A written personalized care plan for preventive services as well as general preventive health recommendations were provided to patient.     Nelda Bucks Gustav Knueppel, NP   12/01/2021   Nurse Notes: Due for 5 th COVID-19 booster  vaccine  Has upcoming appointment with Dr.Gupta would like to discuss wrist pain injured wrist 2-3 weeks ago playing pickelball

## 2021-12-04 ENCOUNTER — Other Ambulatory Visit: Payer: Self-pay

## 2021-12-04 DIAGNOSIS — E559 Vitamin D deficiency, unspecified: Secondary | ICD-10-CM | POA: Diagnosis not present

## 2021-12-04 DIAGNOSIS — E782 Mixed hyperlipidemia: Secondary | ICD-10-CM

## 2021-12-04 DIAGNOSIS — E538 Deficiency of other specified B group vitamins: Secondary | ICD-10-CM

## 2021-12-04 DIAGNOSIS — I10 Essential (primary) hypertension: Secondary | ICD-10-CM

## 2021-12-06 ENCOUNTER — Non-Acute Institutional Stay: Payer: Medicare Other | Admitting: Internal Medicine

## 2021-12-06 ENCOUNTER — Encounter: Payer: Self-pay | Admitting: Internal Medicine

## 2021-12-06 ENCOUNTER — Other Ambulatory Visit: Payer: Self-pay

## 2021-12-06 VITALS — BP 131/86 | HR 71 | Temp 97.8°F | Ht 73.0 in | Wt 196.2 lb

## 2021-12-06 DIAGNOSIS — W19XXXD Unspecified fall, subsequent encounter: Secondary | ICD-10-CM

## 2021-12-06 DIAGNOSIS — R0789 Other chest pain: Secondary | ICD-10-CM | POA: Diagnosis not present

## 2021-12-06 DIAGNOSIS — E782 Mixed hyperlipidemia: Secondary | ICD-10-CM

## 2021-12-06 DIAGNOSIS — M542 Cervicalgia: Secondary | ICD-10-CM

## 2021-12-06 DIAGNOSIS — I10 Essential (primary) hypertension: Secondary | ICD-10-CM | POA: Diagnosis not present

## 2021-12-06 DIAGNOSIS — M25531 Pain in right wrist: Secondary | ICD-10-CM

## 2021-12-06 DIAGNOSIS — R4701 Aphasia: Secondary | ICD-10-CM

## 2021-12-06 NOTE — Progress Notes (Signed)
Location:  [place] of Service:  Clinic (12)  Provider:   Code Status:  Goals of Care:  Advanced Directives 12/06/2021  Does Patient Have a Medical Advance Directive? Yes  Type of Paramedic of [place];Living will;Out of facility DNR (pink MOST or yellow form)  Does patient want to make changes to medical advance directive? No - Patient declined  Copy of [place] in Chart? Yes - validated most recent copy scanned in chart (See row information)  Pre-existing out of facility DNR order (yellow form or pink MOST form) -     Chief Complaint  Patient presents with   Medical Management of Chronic Issues    Patient returns to the clinic for his 6 month follow up.   Quality Metric Gaps    #5 Covid vaccine due    HPI: Patient is a 76 y.o. male seen today for medical management of chronic diseases.    Patient has a history of hypertension, hyperlipidemia, ? Aphasia   His acute complaints today were He fell seems like 6 to 8 weeks ago while playing pickle ball. On  Right hand.  Next day noticed some swelling  was seen by facility nurse and was told to take some Tylenol.   Patient said that his pain and swelling its now resolved but he still feels some discomfort in that wrist. ?aphasia Patient states that sometimes he has issues coming up with the right Words   Dr. Mariea Clonts had also noticed it before and  I did notice that when I was talking to him getting the history from him today. Patient is very active otherwise.  Exercises every day swims.  Takes care of all his finances and has not noticed any progressive memory loss. Chest discomfort He said that he had 2 episodes in which he noticed some chest discomfort once he was hiking in [place].  He denies any radiation or  shortness of breath no diaphoresis.  No family history of heart disease.   Since then patient states that he has biked 25 to 36 miles and has had no  symptoms.   He does not want to see a cardiologist at this time  Past Medical History:  Diagnosis Date   Cataract    Cervicalgia 03/29/2015   History of colonic polyps    Hyperlipidemia    Hypertension    Thrombocytopenia ([place])    Vitamin D deficiency     Past Surgical History:  Procedure Laterality Date   ARM SKIN LESION BIOPSY / EXCISION Left 02/2016   atypical lentigmous and nested melanocytic proliferation--Dr. Harriett Sine   CATARACT EXTRACTION Bilateral january and february 2018    Allergies  Allergen Reactions   Zocor [Simvastatin] Other (See Comments)    Memory loss   Niaspan [Niacin Er]     Outpatient Encounter Medications as of 12/06/2021  Medication Sig   aspirin EC 81 MG tablet Take 81 mg by mouth daily.   Cholecalciferol (VITAMIN D3) 1000 units CAPS Take by mouth daily.   fenofibrate 160 MG tablet Take 1 tablet (160 mg total) by mouth daily.   hydrochlorothiazide (HYDRODIURIL) 25 MG tablet Take 1 tablet (25 mg total) by mouth daily.   lisinopril (ZESTRIL) 40 MG tablet Take 1 tablet (40 mg total) by mouth daily.   vitamin B-12 (CYANOCOBALAMIN) 1000 MCG tablet Take 1,000 mcg by mouth daily.   No facility-administered encounter medications on file as of 12/06/2021.    Review  of Systems:  Review of Systems  Constitutional:  Negative for activity change, appetite change and unexpected weight change.  HENT: Negative.    Respiratory:  Negative for cough and shortness of breath.   Cardiovascular:  Negative for leg swelling.  Gastrointestinal:  Negative for constipation.  Genitourinary:  Negative for frequency.  Musculoskeletal:  Negative for arthralgias, gait problem and myalgias.  Skin: Negative.  Negative for rash.  Neurological:  Negative for dizziness and weakness.  Psychiatric/Behavioral:  Negative for confusion and sleep disturbance.   All other systems reviewed and are negative.  Health Maintenance  Topic Date Due   COVID-19 Vaccine (5 - Booster for  Pfizer series) 04/14/2021   COLONOSCOPY (Pts 45-2yrs Insurance coverage will need to be confirmed)  06/24/2024   TETANUS/TDAP  05/27/2028   Pneumonia Vaccine 30+ Years old  Completed   INFLUENZA VACCINE  Completed   Hepatitis C Screening  Completed   Zoster Vaccines- Shingrix  Completed   HPV VACCINES  Aged Out    Physical Exam: Vitals:   12/06/21 1333  BP: 131/86  Pulse: 71  Temp: 97.8 F (36.6 C)  SpO2: 98%  Weight: 196 lb 3.2 oz (89 kg)  Height: 6\' 1"  (1.854 m)   Body mass index is 25.89 kg/m. Physical Exam Vitals reviewed.  Constitutional:      Appearance: Normal appearance.  HENT:     Head: Normocephalic.     Mouth/Throat:     Mouth: Mucous membranes are moist.     Pharynx: Oropharynx is clear.  Eyes:     Pupils: Pupils are equal, round, and reactive to light.  Cardiovascular:     Rate and Rhythm: Normal rate and regular rhythm.     Pulses: Normal pulses.     Heart sounds: No murmur heard. Pulmonary:     Effort: Pulmonary effort is normal. No respiratory distress.     Breath sounds: Normal breath sounds. No rales.  Abdominal:     General: Abdomen is flat. Bowel sounds are normal.     Palpations: Abdomen is soft.  Musculoskeletal:     Cervical back: Neck supple.     Comments: Mild Swelling in Right leg  Right Wrist exam no Swelling not tender No redness  Skin:    General: Skin is warm.  Neurological:     General: No focal deficit present.     Mental Status: He is alert and oriented to person, place, and time.  Psychiatric:        Mood and Affect: Mood normal.        Thought Content: Thought content normal.    Labs reviewed: Basic Metabolic Panel: Recent Labs    05/30/21 0848 12/04/21 0800  NA 143 141  K 4.1 3.7  CL 106 104  CO2 29 29  GLUCOSE 87 72  BUN 29* 20  CREATININE 1.20 1.06  CALCIUM 9.7 9.3  TSH 1.21 2.07   Liver Function Tests: Recent Labs    05/30/21 0848 12/04/21 0800  AST 19 21  ALT 17 21  BILITOT 0.7 0.7  PROT 6.4  6.2   No results for input(s): LIPASE, AMYLASE in the last 8760 hours. No results for input(s): AMMONIA in the last 8760 hours. CBC: Recent Labs    05/30/21 0848 12/04/21 0800  WBC 5.8 5.6  NEUTROABS 3,944 3,830  HGB 16.0 15.3  HCT 47.1 44.5  MCV 90.9 87.4  PLT 163 159   Lipid Panel: Recent Labs    05/30/21 0848 12/04/21 0800  CHOL 143 137  HDL 49 44  LDLCALC 78 76  TRIG 82 83  CHOLHDL 2.9 3.1   No results found for: HGBA1C  Procedures since last visit: No results found.  Assessment/Plan 1. Right wrist pain  - DG Wrist Complete Right; Future  2. Chest discomfort Will let me know if it happens again Go To ED if symptoms happen again Does not want to go to Cardiology Yet  3. Fall, subsequent encounter No Other injury beside Right Wrist  4. Essential hypertension On Lisinopril and HCTZ  5. Mixed hyperlipidemia On fenofibrate LDL good level 6. Aphasia Will Consider Imaging if any worse  7. Cervicalgia Resolved right now    Labs/tests ordered:  * No order type specified * Next appt:  Visit date not found

## 2021-12-06 NOTE — Patient Instructions (Signed)
Let me know if you have any chest discomfort I have ordered Xray for Wrist if pain does not get better in 3 weeks Can reduce the dose of Vit B12

## 2021-12-07 LAB — COMPLETE METABOLIC PANEL WITH GFR
AG Ratio: 1.8 (calc) (ref 1.0–2.5)
ALT: 21 U/L (ref 9–46)
AST: 21 U/L (ref 10–35)
Albumin: 4 g/dL (ref 3.6–5.1)
Alkaline phosphatase (APISO): 43 U/L (ref 35–144)
BUN: 20 mg/dL (ref 7–25)
CO2: 29 mmol/L (ref 20–32)
Calcium: 9.3 mg/dL (ref 8.6–10.3)
Chloride: 104 mmol/L (ref 98–110)
Creat: 1.06 mg/dL (ref 0.70–1.28)
Globulin: 2.2 g/dL (calc) (ref 1.9–3.7)
Glucose, Bld: 72 mg/dL (ref 65–99)
Potassium: 3.7 mmol/L (ref 3.5–5.3)
Sodium: 141 mmol/L (ref 135–146)
Total Bilirubin: 0.7 mg/dL (ref 0.2–1.2)
Total Protein: 6.2 g/dL (ref 6.1–8.1)
eGFR: 74 mL/min/{1.73_m2} (ref >=60)

## 2021-12-07 LAB — TSH: TSH: 2.07 mIU/L (ref 0.40–4.50)

## 2021-12-07 LAB — VITAMIN D 1,25 DIHYDROXY
Vitamin D 1, 25 (OH)2 Total: 45 pg/mL (ref 18–72)
Vitamin D2 1, 25 (OH)2: <8 pg/mL
Vitamin D3 1, 25 (OH)2: 45 pg/mL

## 2021-12-07 LAB — CBC WITH DIFFERENTIAL/PLATELET
Absolute Monocytes: 414 cells/uL (ref 200–950)
Basophils Absolute: 28 cells/uL (ref 0–200)
Basophils Relative: 0.5 %
Eosinophils Absolute: 101 cells/uL (ref 15–500)
Eosinophils Relative: 1.8 %
HCT: 44.5 % (ref 38.5–50.0)
Hemoglobin: 15.3 g/dL (ref 13.2–17.1)
Lymphs Abs: 1226 cells/uL (ref 850–3900)
MCH: 30.1 pg (ref 27.0–33.0)
MCHC: 34.4 g/dL (ref 32.0–36.0)
MCV: 87.4 fL (ref 80.0–100.0)
MPV: 11.7 fL (ref 7.5–12.5)
Monocytes Relative: 7.4 %
Neutro Abs: 3830 cells/uL (ref 1500–7800)
Neutrophils Relative %: 68.4 %
Platelets: 159 10*3/uL (ref 140–400)
RBC: 5.09 10*6/uL (ref 4.20–5.80)
RDW: 12.3 % (ref 11.0–15.0)
Total Lymphocyte: 21.9 %
WBC: 5.6 10*3/uL (ref 3.8–10.8)

## 2021-12-07 LAB — LIPID PANEL
Cholesterol: 137 mg/dL (ref <200)
HDL: 44 mg/dL (ref >=40)
LDL Cholesterol (Calc): 76 mg/dL (calc)
Non-HDL Cholesterol (Calc): 93 mg/dL (calc) (ref <130)
Total CHOL/HDL Ratio: 3.1 (calc) (ref <5.0)
Triglycerides: 83 mg/dL (ref <150)

## 2021-12-07 LAB — VITAMIN B12: Vitamin B-12: 1333 pg/mL — ABNORMAL HIGH (ref 200–1100)

## 2021-12-08 ENCOUNTER — Other Ambulatory Visit: Payer: Self-pay

## 2021-12-25 ENCOUNTER — Ambulatory Visit
Admission: RE | Admit: 2021-12-25 | Discharge: 2021-12-25 | Disposition: A | Discharge status: Home / Self Care | Payer: Medicare Other | Source: Ambulatory Visit | Attending: Internal Medicine | Admitting: Internal Medicine

## 2021-12-25 DIAGNOSIS — S6991XA Unspecified injury of right wrist, hand and finger(s), initial encounter: Secondary | ICD-10-CM | POA: Diagnosis not present

## 2021-12-25 DIAGNOSIS — M25531 Pain in right wrist: Secondary | ICD-10-CM

## 2021-12-27 ENCOUNTER — Other Ambulatory Visit: Payer: Self-pay | Admitting: Internal Medicine

## 2021-12-27 DIAGNOSIS — W19XXXD Unspecified fall, subsequent encounter: Secondary | ICD-10-CM

## 2021-12-27 DIAGNOSIS — M25531 Pain in right wrist: Secondary | ICD-10-CM

## 2021-12-28 ENCOUNTER — Encounter: Payer: Self-pay | Admitting: Orthopedic Surgery

## 2021-12-28 ENCOUNTER — Ambulatory Visit (INDEPENDENT_AMBULATORY_CARE_PROVIDER_SITE_OTHER): Payer: Medicare Other | Admitting: Orthopedic Surgery

## 2021-12-28 ENCOUNTER — Other Ambulatory Visit: Payer: Self-pay

## 2021-12-28 VITALS — BP 124/74 | HR 73 | Ht 73.0 in | Wt 196.0 lb

## 2021-12-28 DIAGNOSIS — S62024A Nondisplaced fracture of middle third of navicular [scaphoid] bone of right wrist, initial encounter for closed fracture: Secondary | ICD-10-CM

## 2022-01-02 NOTE — Progress Notes (Signed)
? ?Office Visit Note ?  ?Patient: Brandon [name]           ?Date of Birth: 23-Apr-1946           ?MRN: [id number] ?Visit Date: 12/28/2021 ?             ?Requested by: Virgie Dad, MD ?[address] ?[place],  [place] [address] ?PCP: Virgie Dad, MD ? ? ?Assessment & Plan: ?Visit Diagnoses:  ?1. Closed nondisplaced fracture of middle third of scaphoid bone of right wrist, initial encounter   ? ? ?Plan: We reviewed the x-ray which shows a nondisplaced fracture through the right scaphoid waist.  We discussed treatment options including continued monitoring without treatment given that he is asymptomatic versus cast immobilization versus fixation with headless compression screw.  He has a big bike ride coming up in mid April to May and would like to avoid having to delay if possible.  We discussed that I cannot predict if the fracture will displace further or if he will eventually develop symptoms related to the fracture or a potential nonunion.  I would like to get a CT scan to further characterize the architecture of the fracture given that he is asymptomatic.  He will call once the CT is completed and we will discuss the results together.  ? ?Follow-Up Instructions: No follow-ups on file.  ? ?Orders:  ?Orders Placed This Encounter  ?Procedures  ? CT WRIST RIGHT WO CONTRAST  ? ?No orders of the defined types were placed in this encounter. ? ? ? ? Procedures: ?No procedures performed ? ? ?Clinical Data: ?No additional findings. ? ? ?Subjective: ?Chief Complaint  ?Patient presents with  ? Right Wrist - Injury  ?  States that he fell playing pickle ball about 1 month ago, that his pain did not start right way that it took 2-3 hours to start hurting., LEFT Handed, pain:4 /70 with certain position  ? ? ?This is a 76 year old left-hand-dominant male who presents with right wrist pain.  He notes that about 6 weeks ago he was playing pickle ball when he fell backwards onto his outstretched right hand.  He did not notice  any pain initially.  He has largely been asymptomatic since the injury and is able to do all his daily activities with minimal difficulty.  He does note that he has pain at the radial aspect of the wrist around the anatomic snuffbox with certain positions.  He is unable to reproduce these positions in the office today.  When this happens his pain is 4/10 in severity.  He does note that he has been able to ride his bike since this injury.  He denies pain elsewhere in the extremity. ? ?Injury ? ? ?Review of Systems ? ? ?Objective: ?Vital Signs: BP 124/74 (BP Location: Left Arm, Patient Position: Sitting)   Pulse 73   Ht '6\' 1"'$  (1.854 m)   Wt 196 lb (88.9 kg)   BMI 25.86 kg/m?  ? ?Physical Exam ?Constitutional:   ?   Appearance: Normal appearance.  ?Cardiovascular:  ?   Rate and Rhythm: Normal rate.  ?   Pulses: Normal pulses.  ?Pulmonary:  ?   Effort: Pulmonary effort is normal.  ?Skin: ?   General: Skin is warm and dry.  ?   Capillary Refill: Capillary refill takes less than 2 seconds.  ?Neurological:  ?   Mental Status: He is alert.  ? ? ?Right Hand Exam  ? ?Tenderness  ?Right hand tenderness  location: Mildly TTP at anatomic snuffbox. ? ?Range of Motion  ?The patient has normal right wrist ROM.  ? ?Other  ?Erythema: absent ?Sensation: normal ?Pulse: present ? ? ? ? ?Specialty Comments:  ?No specialty comments available. ? ?Imaging: ?No results found. ? ? ?PMFS History: ?Patient Active Problem List  ? Diagnosis Date Noted  ? Degenerative disc disease, cervical 06/20/2020  ? Slow urinary stream 05/16/2017  ? Nocturia more than twice per night 05/16/2017  ? Thumb tendonitis 04/06/2016  ? Cervicalgia 03/29/2015  ? Essential hypertension 12/17/2014  ? Vitamin D deficiency 12/17/2014  ? Thrombocytopenia ([place]) 12/17/2014  ? History of colonic polyps 12/17/2014  ? Hyperlipidemia 12/17/2014  ? Counseling regarding advanced care planning and goals of care 12/17/2014  ? ?Past Medical History:  ?Diagnosis Date  ? Cataract    ? Cervicalgia 03/29/2015  ? History of colonic polyps   ? Hyperlipidemia   ? Hypertension   ? Thrombocytopenia ([place])   ? Vitamin D deficiency   ?  ?Family History  ?Problem Relation Age of Onset  ? Clotting disorder Father   ? Suicidality Father   ? High Cholesterol Mother   ? Renal Disease Mother   ? Stroke Mother 64  ? Colon cancer Neg Hx   ? Esophageal cancer Neg Hx   ? Stomach cancer Neg Hx   ? Rectal cancer Neg Hx   ?  ?Past Surgical History:  ?Procedure Laterality Date  ? ARM SKIN LESION BIOPSY / EXCISION Left 02/2016  ? atypical lentigmous and nested melanocytic proliferation--Dr. Harriett Sine  ? CATARACT EXTRACTION Bilateral january and february 2018  ? ?Social History  ? ?Occupational History  ? Not on file  ?Tobacco Use  ? Smoking status: Never  ? Smokeless tobacco: Never  ?Vaping Use  ? Vaping Use: Never used  ?Substance and Sexual Activity  ? Alcohol use: Yes  ?  Alcohol/week: 1.0 standard drink  ?  Types: 1 Cans of beer per week  ?  Comment: every 6 months   ? Drug use: No  ? Sexual activity: Not Currently  ? ? ? ? ? ? ?

## 2022-01-09 ENCOUNTER — Telehealth: Payer: Self-pay | Admitting: Orthopedic Surgery

## 2022-01-09 NOTE — Telephone Encounter (Signed)
Called pt 1X and left a vm to set an MRI review appt with Dr. Tempie Donning after 01/12/22 ?

## 2022-01-12 ENCOUNTER — Ambulatory Visit
Admission: RE | Admit: 2022-01-12 | Discharge: 2022-01-12 | Disposition: A | Discharge status: Home / Self Care | Payer: Medicare Other | Source: Ambulatory Visit | Attending: Orthopedic Surgery | Admitting: Orthopedic Surgery

## 2022-01-12 DIAGNOSIS — S62001A Unspecified fracture of navicular [scaphoid] bone of right wrist, initial encounter for closed fracture: Secondary | ICD-10-CM | POA: Diagnosis not present

## 2022-01-12 DIAGNOSIS — S62024A Nondisplaced fracture of middle third of navicular [scaphoid] bone of right wrist, initial encounter for closed fracture: Secondary | ICD-10-CM

## 2022-01-18 ENCOUNTER — Ambulatory Visit (INDEPENDENT_AMBULATORY_CARE_PROVIDER_SITE_OTHER): Payer: Medicare Other | Admitting: Orthopedic Surgery

## 2022-01-18 ENCOUNTER — Encounter: Payer: Self-pay | Admitting: Orthopedic Surgery

## 2022-01-18 ENCOUNTER — Other Ambulatory Visit: Payer: Self-pay

## 2022-01-18 DIAGNOSIS — S62001A Unspecified fracture of navicular [scaphoid] bone of right wrist, initial encounter for closed fracture: Secondary | ICD-10-CM | POA: Insufficient documentation

## 2022-01-18 DIAGNOSIS — S62024A Nondisplaced fracture of middle third of navicular [scaphoid] bone of right wrist, initial encounter for closed fracture: Secondary | ICD-10-CM | POA: Diagnosis not present

## 2022-01-18 NOTE — Progress Notes (Signed)
? ?Office Visit Note ?  ?Patient: Brandon [name]           ?Date of Birth: 1946-09-09           ?MRN: [id number] ?Visit Date: 01/18/2022 ?             ?Requested by: Virgie Dad, MD ?[address] ?[place],  [place] [address] ?PCP: Virgie Dad, MD ? ? ?Assessment & Plan: ?Visit Diagnoses:  ?1. Closed nondisplaced fracture of middle third of scaphoid bone of right wrist, initial encounter   ? ? ?Plan: Patient presents today to discuss his scaphoid fracture and the CT results.  We reviewed the CT which shows a minimally displaced fracture through the waist of the scaphoid with some resorption at the level of the scaphoid waist.  There is some mild comminution at the dorsal proximal scaphoid.  We again discussed conservative versus surgical treatment for this fracture.  Discussed that if we treat this nonsurgically I cannot guarantee that he will not have further displacement of the fracture and cannot predict if he will become symptomatic in the future as he is minimally symptomatic now.  We discussed that surgery would involve fixation of the fracture with a screw with possible debridement of the fracture site and bone grafting.  We discussed the risk of surgery including bleeding, infection, damage to neurovascular structures, nonunion, malunion, need for additional surgery.  He is relatively active and is planning a 30 mile bike ride for the summer.  After our discussion, he would like to proceed with surgical fixation with a headless compression screw.  We discussed the expected postop course including immobilization and involvement of hand therapy.  Surgical date will be confirmed with the patient. ? ?Follow-Up Instructions: No follow-ups on file.  ? ?Orders:  ?No orders of the defined types were placed in this encounter. ? ?No orders of the defined types were placed in this encounter. ? ? ? ? Procedures: ?No procedures performed ? ? ?Clinical Data: ?No additional findings. ? ? ?Subjective: ?Chief Complaint   ?Patient presents with  ? Right Wrist - Follow-up  ? ? ?This is a 76 year old left-hand-dominant male presents for follow-up of a right scaphoid waist fracture.  Notes that he fell about 6 to 8 weeks ago playing pickle ball onto his outstretched right hand.  He had no pain initially and has been mostly asymptomatic.  He does have some pain at the radial aspect of the wrist around the snuffbox with certain activities.  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  He did a 20 mile bike ride recently and notes that when he hit a large bump or had a rough spot in the road he would feel some discomfort at the radial aspect of his wrist.  He has not been in any kind of immobilization.  He recent underwent a CT scan to further characterize the fracture. ? ? ? ?Review of Systems ? ? ?Objective: ?Vital Signs: There were no vitals taken for this visit. ? ?Physical Exam ?Constitutional:   ?   Appearance: Normal appearance.  ?Cardiovascular:  ?   Rate and Rhythm: Normal rate.  ?   Pulses: Normal pulses.  ?Pulmonary:  ?   Effort: Pulmonary effort is normal.  ?Skin: ?   General: Skin is warm and dry.  ?   Capillary Refill: Capillary refill takes less than 2 seconds.  ?Neurological:  ?   Mental Status: He is  alert.  ? ? ?Right Hand Exam  ? ?Tenderness  ?Right hand tenderness location: Mild TTP at anatomic snuffbox.  No swelling. ? ?Range of Motion  ?The patient has normal right wrist ROM.  ? ?Other  ?Erythema: absent ?Sensation: normal ?Pulse: present ? ?Comments:  No pain w/ AROM of the wrist in the office.  ? ? ? ? ?Specialty Comments:  ?No specialty comments available. ? ?Imaging: ?No results found. ? ? ?PMFS History: ?Patient Active Problem List  ? Diagnosis Date Noted  ? Nondisplaced fracture of right scaphoid bone 01/18/2022  ? Degenerative disc disease, cervical 06/20/2020  ? Slow urinary stream 05/16/2017  ? Nocturia more than twice per night 05/16/2017  ? Thumb tendonitis 04/06/2016  ?  Cervicalgia 03/29/2015  ? Essential hypertension 12/17/2014  ? Vitamin D deficiency 12/17/2014  ? Thrombocytopenia ([place]) 12/17/2014  ? History of colonic polyps 12/17/2014  ? Hyperlipidemia 12/17/2014  ? Counseling regarding advanced care planning and goals of care 12/17/2014  ? ?Past Medical History:  ?Diagnosis Date  ? Cataract   ? Cervicalgia 03/29/2015  ? History of colonic polyps   ? Hyperlipidemia   ? Hypertension   ? Thrombocytopenia ([place])   ? Vitamin D deficiency   ?  ?Family History  ?Problem Relation Age of Onset  ? Clotting disorder Father   ? Suicidality Father   ? High Cholesterol Mother   ? Renal Disease Mother   ? Stroke Mother 35  ? Colon cancer Neg Hx   ? Esophageal cancer Neg Hx   ? Stomach cancer Neg Hx   ? Rectal cancer Neg Hx   ?  ?Past Surgical History:  ?Procedure Laterality Date  ? ARM SKIN LESION BIOPSY / EXCISION Left 02/2016  ? atypical lentigmous and nested melanocytic proliferation--Dr. Harriett Sine  ? CATARACT EXTRACTION Bilateral january and february 2018  ? ?Social History  ? ?Occupational History  ? Not on file  ?Tobacco Use  ? Smoking status: Never  ? Smokeless tobacco: Never  ?Vaping Use  ? Vaping Use: Never used  ?Substance and Sexual Activity  ? Alcohol use: Yes  ?  Alcohol/week: 1.0 standard drink  ?  Types: 1 Cans of beer per week  ?  Comment: every 6 months   ? Drug use: No  ? Sexual activity: Not Currently  ? ? ? ? ? ? ?

## 2022-01-18 NOTE — H&P (View-Only) (Signed)
? ?Office Visit Note ?  ?Patient: Brandon [name]           ?Date of Birth: [birth year]           ?MRN: [id number] ?Visit Date: 01/18/2022 ?             ?Requested by: Virgie Dad, MD ?[address] ?[place],  [place] [address] ?PCP: Virgie Dad, MD ? ? ?Assessment & Plan: ?Visit Diagnoses:  ?1. Closed nondisplaced fracture of middle third of scaphoid bone of right wrist, initial encounter   ? ? ?Plan: Patient presents today to discuss his scaphoid fracture and the CT results.  We reviewed the CT which shows a minimally displaced fracture through the waist of the scaphoid with some resorption at the level of the scaphoid waist.  There is some mild comminution at the dorsal proximal scaphoid.  We again discussed conservative versus surgical treatment for this fracture.  Discussed that if we treat this nonsurgically I cannot guarantee that he will not have further displacement of the fracture and cannot predict if he will become symptomatic in the future as he is minimally symptomatic now.  We discussed that surgery would involve fixation of the fracture with a screw with possible debridement of the fracture site and bone grafting.  We discussed the risk of surgery including bleeding, infection, damage to neurovascular structures, nonunion, malunion, need for additional surgery.  He is relatively active and is planning a 30 mile bike ride for the summer.  After our discussion, he would like to proceed with surgical fixation with a headless compression screw.  We discussed the expected postop course including immobilization and involvement of hand therapy.  Surgical date will be confirmed with the patient. ? ?Follow-Up Instructions: No follow-ups on file.  ? ?Orders:  ?No orders of the defined types were placed in this encounter. ? ?No orders of the defined types were placed in this encounter. ? ? ? ? Procedures: ?No procedures performed ? ? ?Clinical Data: ?No additional findings. ? ? ?Subjective: ?Chief Complaint   ?Patient presents with  ? Right Wrist - Follow-up  ? ? ?This is a 76 year old left-hand-dominant male presents for follow-up of a right scaphoid waist fracture.  Notes that he fell about 6 to 8 weeks ago playing pickle ball onto his outstretched right hand.  He had no pain initially and has been mostly asymptomatic.  He does have some pain at the radial aspect of the wrist around the snuffbox with certain activities.  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  He did a 20 mile bike ride recently and notes that when he hit a large bump or had a rough spot in the road he would feel some discomfort at the radial aspect of his wrist.  He has not been in any kind of immobilization.  He recent underwent a CT scan to further characterize the fracture. ? ? ? ?Review of Systems ? ? ?Objective: ?Vital Signs: There were no vitals taken for this visit. ? ?Physical Exam ?Constitutional:   ?   Appearance: Normal appearance.  ?Cardiovascular:  ?   Rate and Rhythm: Normal rate.  ?   Pulses: Normal pulses.  ?Pulmonary:  ?   Effort: Pulmonary effort is normal.  ?Skin: ?   General: Skin is warm and dry.  ?   Capillary Refill: Capillary refill takes less than 2 seconds.  ?Neurological:  ?   Mental Status: He is  alert.  ? ? ?Right Hand Exam  ? ?Tenderness  ?Right hand tenderness location: Mild TTP at anatomic snuffbox.  No swelling. ? ?Range of Motion  ?The patient has normal right wrist ROM.  ? ?Other  ?Erythema: absent ?Sensation: normal ?Pulse: present ? ?Comments:  No pain w/ AROM of the wrist in the office.  ? ? ? ? ?Specialty Comments:  ?No specialty comments available. ? ?Imaging: ?No results found. ? ? ?PMFS History: ?Patient Active Problem List  ? Diagnosis Date Noted  ? Nondisplaced fracture of right scaphoid bone 01/18/2022  ? Degenerative disc disease, cervical 06/20/2020  ? Slow urinary stream 05/16/2017  ? Nocturia more than twice per night 05/16/2017  ? Thumb tendonitis 04/06/2016  ?  Cervicalgia 03/29/2015  ? Essential hypertension 12/17/2014  ? Vitamin D deficiency 12/17/2014  ? Thrombocytopenia ([place]) 12/17/2014  ? History of colonic polyps 12/17/2014  ? Hyperlipidemia 12/17/2014  ? Counseling regarding advanced care planning and goals of care 12/17/2014  ? ?Past Medical History:  ?Diagnosis Date  ? Cataract   ? Cervicalgia 03/29/2015  ? History of colonic polyps   ? Hyperlipidemia   ? Hypertension   ? Thrombocytopenia ([place])   ? Vitamin D deficiency   ?  ?Family History  ?Problem Relation Age of Onset  ? Clotting disorder Father   ? Suicidality Father   ? High Cholesterol Mother   ? Renal Disease Mother   ? Stroke Mother 65  ? Colon cancer Neg Hx   ? Esophageal cancer Neg Hx   ? Stomach cancer Neg Hx   ? Rectal cancer Neg Hx   ?  ?Past Surgical History:  ?Procedure Laterality Date  ? ARM SKIN LESION BIOPSY / EXCISION Left 02/2016  ? atypical lentigmous and nested melanocytic proliferation--Dr. Harriett Sine  ? CATARACT EXTRACTION Bilateral january and february 2018  ? ?Social History  ? ?Occupational History  ? Not on file  ?Tobacco Use  ? Smoking status: Never  ? Smokeless tobacco: Never  ?Vaping Use  ? Vaping Use: Never used  ?Substance and Sexual Activity  ? Alcohol use: Yes  ?  Alcohol/week: 1.0 standard drink  ?  Types: 1 Cans of beer per week  ?  Comment: every 6 months   ? Drug use: No  ? Sexual activity: Not Currently  ? ? ? ? ? ? ?

## 2022-01-23 ENCOUNTER — Encounter (HOSPITAL_COMMUNITY): Payer: Self-pay | Admitting: Orthopedic Surgery

## 2022-01-23 ENCOUNTER — Other Ambulatory Visit: Payer: Self-pay

## 2022-01-23 NOTE — Progress Notes (Signed)
Mr Brandon [name] denies chest pain or shortness of breath. Patient denies having any s/s of Covid in his household.  Patient denies any known exposure to Covid.  ? ?Brandon [name] lives with his wife in [place] at [facility].Patient's PCP is Dr Lyndel Safe at [facility] ? ?I instructed Brandon [name] to shower with antibacteria soap. No nail polish, artificial or acrylic nails. Wear clean clothes, brush your teeth. ?Glasses, contact lens,dentures or partials may not be worn in the OR. If you need to wear them, please bring a case for glasses, do not wear contacts or bring a case, the hospital does not have contact cases, dentures or partials will have to be removed , make sure they are clean, we will provide a denture cup to put them in. You will need some one to drive you home and a responsible person over the age of 40 to stay with you for the first 24 hours after surgery.  ?

## 2022-01-23 NOTE — Anesthesia Preprocedure Evaluation (Addendum)
Anesthesia Evaluation  ?Patient identified by MRN, date of birth, ID band ?Patient awake ? ? ? ?Reviewed: ?Allergy & Precautions, NPO status , Patient's Chart, lab work & pertinent test results ? ?History of Anesthesia Complications ?Negative for: history of anesthetic complications ? ?Airway ?Mallampati: I ? ?TM Distance: >3 FB ?Neck ROM: Full ? ? ? Dental ? ?(+) Dental Advisory Given ?  ?Pulmonary ?neg pulmonary ROS,  ?  ?breath sounds clear to auscultation ? ? ? ? ? ? Cardiovascular ?hypertension, Pt. on medications ?(-) angina ?Rhythm:Regular Rate:Normal ? ? ?  ?Neuro/Psych ?negative neurological ROS ?   ? GI/Hepatic ?negative GI ROS, Neg liver ROS,   ?Endo/Other  ?negative endocrine ROS ? Renal/GU ?negative Renal ROS  ? ?  ?Musculoskeletal ? ?(+) Arthritis ,  ? Abdominal ?  ?Peds ? Hematology ?negative hematology ROS ?(+)   ?Anesthesia Other Findings ? ? Reproductive/Obstetrics ? ?  ? ? ? ? ? ? ? ? ? ? ? ? ? ?  ?  ? ? ? ? ? ? ? ?Anesthesia Physical ?Anesthesia Plan ? ?ASA: 2 ? ?Anesthesia Plan: General  ? ?Post-op Pain Management: Tylenol PO (pre-op)* and Regional block*  ? ?Induction: Intravenous ? ?PONV Risk Score and Plan: 2 and Ondansetron and Dexamethasone ? ?Airway Management Planned: LMA ? ?Additional Equipment: None ? ?Intra-op Plan:  ? ?Post-operative Plan:  ? ?Informed Consent: I have reviewed the patients History and Physical, chart, labs and discussed the procedure including the risks, benefits and alternatives for the proposed anesthesia with the patient or authorized representative who has indicated his/her understanding and acceptance.  ? ? ? ?Dental advisory given ? ?Plan Discussed with: CRNA and Surgeon ? ?Anesthesia Plan Comments: (Plan routine monitors, Supraclavicular block for post op analgesia)  ? ? ? ? ?Anesthesia Quick Evaluation ? ?

## 2022-01-24 ENCOUNTER — Other Ambulatory Visit: Payer: Self-pay

## 2022-01-24 ENCOUNTER — Ambulatory Visit (HOSPITAL_BASED_OUTPATIENT_CLINIC_OR_DEPARTMENT_OTHER): Payer: Medicare Other | Admitting: Anesthesiology

## 2022-01-24 ENCOUNTER — Ambulatory Visit (HOSPITAL_COMMUNITY): Payer: Medicare Other | Admitting: Anesthesiology

## 2022-01-24 ENCOUNTER — Encounter (HOSPITAL_COMMUNITY)
Admission: RE | Disposition: A | Discharge status: Home / Self Care | Payer: Self-pay | Source: Home / Self Care | Attending: Orthopedic Surgery

## 2022-01-24 ENCOUNTER — Ambulatory Visit (HOSPITAL_COMMUNITY): Payer: Medicare Other

## 2022-01-24 ENCOUNTER — Telehealth: Payer: Self-pay

## 2022-01-24 ENCOUNTER — Ambulatory Visit (HOSPITAL_COMMUNITY)
Admission: RE | Admit: 2022-01-24 | Discharge: 2022-01-24 | Disposition: A | Discharge status: Home / Self Care | Payer: Medicare Other | Attending: Orthopedic Surgery | Admitting: Orthopedic Surgery

## 2022-01-24 ENCOUNTER — Encounter (HOSPITAL_COMMUNITY): Payer: Self-pay | Admitting: Orthopedic Surgery

## 2022-01-24 DIAGNOSIS — W1839XA Other fall on same level, initial encounter: Secondary | ICD-10-CM | POA: Insufficient documentation

## 2022-01-24 DIAGNOSIS — Z79899 Other long term (current) drug therapy: Secondary | ICD-10-CM | POA: Diagnosis not present

## 2022-01-24 DIAGNOSIS — M1990 Unspecified osteoarthritis, unspecified site: Secondary | ICD-10-CM | POA: Insufficient documentation

## 2022-01-24 DIAGNOSIS — I10 Essential (primary) hypertension: Secondary | ICD-10-CM | POA: Insufficient documentation

## 2022-01-24 DIAGNOSIS — S62001A Unspecified fracture of navicular [scaphoid] bone of right wrist, initial encounter for closed fracture: Secondary | ICD-10-CM

## 2022-01-24 DIAGNOSIS — S62024A Nondisplaced fracture of middle third of navicular [scaphoid] bone of right wrist, initial encounter for closed fracture: Secondary | ICD-10-CM | POA: Insufficient documentation

## 2022-01-24 DIAGNOSIS — Y9369 Activity, other involving other sports and athletics played as a team or group: Secondary | ICD-10-CM | POA: Insufficient documentation

## 2022-01-24 DIAGNOSIS — G8918 Other acute postprocedural pain: Secondary | ICD-10-CM | POA: Diagnosis not present

## 2022-01-24 HISTORY — DX: Unspecified osteoarthritis, unspecified site: M19.90

## 2022-01-24 HISTORY — PX: ORIF SCAPHOID FRACTURE: SHX2130

## 2022-01-24 LAB — BASIC METABOLIC PANEL
Anion gap: 7 (ref 5–15)
BUN: 22 mg/dL (ref 8–23)
CO2: 23 mmol/L (ref 22–32)
Calcium: 9.1 mg/dL (ref 8.9–10.3)
Chloride: 111 mmol/L (ref 98–111)
Creatinine, Ser: 1.09 mg/dL (ref 0.61–1.24)
GFR, Estimated: >60 mL/min (ref >60)
Glucose, Bld: 100 mg/dL — ABNORMAL HIGH (ref 70–99)
Potassium: 3.6 mmol/L (ref 3.5–5.1)
Sodium: 141 mmol/L (ref 135–145)

## 2022-01-24 LAB — CBC
HCT: 42.8 % (ref 39.0–52.0)
Hemoglobin: 14.6 g/dL (ref 13.0–17.0)
MCH: 30.3 pg (ref 26.0–34.0)
MCHC: 34.1 g/dL (ref 30.0–36.0)
MCV: 88.8 fL (ref 80.0–100.0)
Platelets: 127 10*3/uL — ABNORMAL LOW (ref 150–400)
RBC: 4.82 MIL/uL (ref 4.22–5.81)
RDW: 13.1 % (ref 11.5–15.5)
WBC: 4.7 10*3/uL (ref 4.0–10.5)
nRBC: 0 % (ref 0.0–0.2)

## 2022-01-24 SURGERY — OPEN REDUCTION INTERNAL FIXATION (ORIF) SCAPHOID FRACTURE
Anesthesia: General | Laterality: Right

## 2022-01-24 MED ORDER — LIDOCAINE 2% (20 MG/ML) 5 ML SYRINGE
INTRAMUSCULAR | Status: AC
  Filled 2022-01-24: qty 5

## 2022-01-24 MED ORDER — HYDROMORPHONE HCL 1 MG/ML IJ SOLN: 0.2500 mg | INTRAMUSCULAR | Status: DC | PRN

## 2022-01-24 MED ORDER — ACETAMINOPHEN 500 MG PO TABS
1000.0000 mg | ORAL_TABLET | Freq: Once | ORAL | Status: AC
  Administered 2022-01-24: 1000 mg via ORAL
  Filled 2022-01-24: qty 2

## 2022-01-24 MED ORDER — LACTATED RINGERS IV SOLN: INTRAVENOUS | Status: DC

## 2022-01-24 MED ORDER — MEPERIDINE HCL 25 MG/ML IJ SOLN: 6.2500 mg | INTRAMUSCULAR | Status: DC | PRN

## 2022-01-24 MED ORDER — DEXAMETHASONE SODIUM PHOSPHATE 10 MG/ML IJ SOLN
INTRAMUSCULAR | Status: AC
  Filled 2022-01-24: qty 1

## 2022-01-24 MED ORDER — MIDAZOLAM HCL 2 MG/2ML IJ SOLN
INTRAMUSCULAR | Status: AC
  Filled 2022-01-24: qty 2

## 2022-01-24 MED ORDER — PROPOFOL 10 MG/ML IV BOLUS
INTRAVENOUS | Status: DC | PRN
  Administered 2022-01-24: 50 mg via INTRAVENOUS
  Administered 2022-01-24: 150 mg via INTRAVENOUS

## 2022-01-24 MED ORDER — FENTANYL CITRATE (PF) 250 MCG/5ML IJ SOLN
INTRAMUSCULAR | Status: AC
  Filled 2022-01-24: qty 5

## 2022-01-24 MED ORDER — OXYCODONE HCL 5 MG/5ML PO SOLN: 5.0000 mg | Freq: Once | ORAL | Status: DC | PRN

## 2022-01-24 MED ORDER — MIDAZOLAM HCL 2 MG/2ML IJ SOLN: 0.5000 mg | Freq: Once | INTRAMUSCULAR | Status: DC | PRN

## 2022-01-24 MED ORDER — DEXAMETHASONE SODIUM PHOSPHATE 4 MG/ML IJ SOLN
INTRAMUSCULAR | Status: DC | PRN
  Administered 2022-01-24: 10 mg via INTRAVENOUS

## 2022-01-24 MED ORDER — 0.9 % SODIUM CHLORIDE (POUR BTL) OPTIME
TOPICAL | Status: DC | PRN
  Administered 2022-01-24: 1000 mL

## 2022-01-24 MED ORDER — ORAL CARE MOUTH RINSE: 15.0000 mL | Freq: Once | OROMUCOSAL | Status: AC

## 2022-01-24 MED ORDER — ONDANSETRON HCL 4 MG/2ML IJ SOLN
INTRAMUSCULAR | Status: DC | PRN
  Administered 2022-01-24: 4 mg via INTRAVENOUS

## 2022-01-24 MED ORDER — EPHEDRINE SULFATE-NACL 50-0.9 MG/10ML-% IV SOSY
PREFILLED_SYRINGE | INTRAVENOUS | Status: DC | PRN
  Administered 2022-01-24 (×5): 5 mg via INTRAVENOUS

## 2022-01-24 MED ORDER — BUPIVACAINE HCL (PF) 0.25 % IJ SOLN
INTRAMUSCULAR | Status: AC
  Filled 2022-01-24: qty 30

## 2022-01-24 MED ORDER — OXYCODONE HCL 5 MG PO TABS: 5.0000 mg | ORAL_TABLET | Freq: Once | ORAL | Status: DC | PRN

## 2022-01-24 MED ORDER — BUPIVACAINE-EPINEPHRINE (PF) 0.5% -1:200000 IJ SOLN
INTRAMUSCULAR | Status: DC | PRN
  Administered 2022-01-24: 25 mL via PERINEURAL

## 2022-01-24 MED ORDER — CEFAZOLIN SODIUM-DEXTROSE 2-4 GM/100ML-% IV SOLN
2.0000 g | INTRAVENOUS | Status: AC
  Administered 2022-01-24: 2 g via INTRAVENOUS
  Filled 2022-01-24: qty 100

## 2022-01-24 MED ORDER — FENTANYL CITRATE (PF) 100 MCG/2ML IJ SOLN
INTRAMUSCULAR | Status: DC | PRN
  Administered 2022-01-24: 50 ug via INTRAVENOUS

## 2022-01-24 MED ORDER — LIDOCAINE 2% (20 MG/ML) 5 ML SYRINGE
INTRAMUSCULAR | Status: DC | PRN
  Administered 2022-01-24: 40 mg via INTRAVENOUS

## 2022-01-24 MED ORDER — PROPOFOL 10 MG/ML IV BOLUS
INTRAVENOUS | Status: AC
  Filled 2022-01-24: qty 20

## 2022-01-24 MED ORDER — OXYCODONE HCL 5 MG PO TABS: 5.0000 mg | ORAL_TABLET | Freq: Four times a day (QID) | ORAL | 0 refills | Status: AC | PRN

## 2022-01-24 MED ORDER — ONDANSETRON HCL 4 MG/2ML IJ SOLN
INTRAMUSCULAR | Status: AC
  Filled 2022-01-24: qty 2

## 2022-01-24 MED ORDER — MIDAZOLAM HCL 5 MG/5ML IJ SOLN
INTRAMUSCULAR | Status: DC | PRN
  Administered 2022-01-24: 1 mg via INTRAVENOUS

## 2022-01-24 MED ORDER — PHENYLEPHRINE 40 MCG/ML (10ML) SYRINGE FOR IV PUSH (FOR BLOOD PRESSURE SUPPORT)
PREFILLED_SYRINGE | INTRAVENOUS | Status: DC | PRN
  Administered 2022-01-24 (×2): 80 ug via INTRAVENOUS
  Administered 2022-01-24: 40 ug via INTRAVENOUS
  Administered 2022-01-24 (×2): 80 ug via INTRAVENOUS
  Administered 2022-01-24: 40 ug via INTRAVENOUS
  Administered 2022-01-24 (×2): 80 ug via INTRAVENOUS
  Administered 2022-01-24: 40 ug via INTRAVENOUS

## 2022-01-24 MED ORDER — CHLORHEXIDINE GLUCONATE 0.12 % MT SOLN
15.0000 mL | Freq: Once | OROMUCOSAL | Status: AC
  Administered 2022-01-24: 15 mL via OROMUCOSAL
  Filled 2022-01-24: qty 15

## 2022-01-24 SURGICAL SUPPLY — 56 items
BAG COUNTER SPONGE SURGICOUNT (BAG) ×2 IMPLANT
BIT DRILL ACUTRAK 2 MINI (BIT) IMPLANT
BIT DRILL MINI LNG ACUTRAK 2 (BIT) IMPLANT
BLADE CLIPPER SURG (BLADE) IMPLANT
BNDG ELASTIC 3X5.8 VLCR STR LF (GAUZE/BANDAGES/DRESSINGS) ×2 IMPLANT
BNDG ELASTIC 4X5.8 VLCR STR LF (GAUZE/BANDAGES/DRESSINGS) ×2 IMPLANT
BNDG ELASTIC 6X5.8 VLCR STR LF (GAUZE/BANDAGES/DRESSINGS) ×1 IMPLANT
BNDG ESMARK 4X9 LF (GAUZE/BANDAGES/DRESSINGS) ×2 IMPLANT
BNDG GAUZE ELAST 4 BULKY (GAUZE/BANDAGES/DRESSINGS) ×2 IMPLANT
CANISTER SUCT 3000ML PPV (MISCELLANEOUS) ×2 IMPLANT
CORD BIPOLAR FORCEPS 12FT (ELECTRODE) ×2 IMPLANT
COVER SURGICAL LIGHT HANDLE (MISCELLANEOUS) ×2 IMPLANT
CUFF TOURN SGL QUICK 18X4 (TOURNIQUET CUFF) ×2 IMPLANT
CUFF TOURN SGL QUICK 24 (TOURNIQUET CUFF)
CUFF TRNQT CYL 24X4X16.5-23 (TOURNIQUET CUFF) IMPLANT
DECANTER SPIKE VIAL GLASS SM (MISCELLANEOUS) ×2 IMPLANT
DRAPE OEC MINIVIEW 54X84 (DRAPES) ×2 IMPLANT
DRAPE SURG 17X11 SM STRL (DRAPES) ×2 IMPLANT
DRILL ACUTRAK 2 MINI (BIT) ×2
DRILL MINI LNG ACUTRAK 2 (BIT) ×2
DRSG ADAPTIC 3X8 NADH LF (GAUZE/BANDAGES/DRESSINGS) ×2 IMPLANT
GAUZE 4X4 16PLY ~~LOC~~+RFID DBL (SPONGE) ×2 IMPLANT
GAUZE SPONGE 4X4 12PLY STRL (GAUZE/BANDAGES/DRESSINGS) ×2 IMPLANT
GAUZE XEROFORM 1X8 LF (GAUZE/BANDAGES/DRESSINGS) ×1 IMPLANT
GAUZE XEROFORM 5X9 LF (GAUZE/BANDAGES/DRESSINGS) ×2 IMPLANT
GLOVE SURG ORTHO LTX SZ8 (GLOVE) ×2 IMPLANT
GLOVE SURG UNDER POLY LF SZ8.5 (GLOVE) ×2 IMPLANT
GOWN STRL REUS W/ TWL LRG LVL3 (GOWN DISPOSABLE) ×1 IMPLANT
GOWN STRL REUS W/ TWL XL LVL3 (GOWN DISPOSABLE) ×1 IMPLANT
GOWN STRL REUS W/TWL LRG LVL3 (GOWN DISPOSABLE) ×1
GOWN STRL REUS W/TWL XL LVL3 (GOWN DISPOSABLE) ×1
GUIDEWIRE ORTHO MINI ACTK .045 (WIRE) ×2 IMPLANT
KIT BASIN OR (CUSTOM PROCEDURE TRAY) ×2 IMPLANT
KIT TURNOVER KIT B (KITS) ×2 IMPLANT
NDL HYPO 25X1 1.5 SAFETY (NEEDLE) ×1 IMPLANT
NEEDLE HYPO 25X1 1.5 SAFETY (NEEDLE) ×2 IMPLANT
NS IRRIG 1000ML POUR BTL (IV SOLUTION) ×2 IMPLANT
PACK ORTHO EXTREMITY (CUSTOM PROCEDURE TRAY) ×2 IMPLANT
PAD ARMBOARD 7.5X6 YLW CONV (MISCELLANEOUS) ×4 IMPLANT
PAD CAST 4YDX4 CTTN HI CHSV (CAST SUPPLIES) ×1 IMPLANT
PADDING CAST COTTON 4X4 STRL (CAST SUPPLIES) ×1
PADDING CAST COTTON 6X4 STRL (CAST SUPPLIES) ×1 IMPLANT
SCREW ACUTRAK 2 MINI 20MM (Screw) ×1 IMPLANT
SLING ARM FOAM STRAP LRG (SOFTGOODS) ×1 IMPLANT
SOAP 2 % CHG 4 OZ (WOUND CARE) ×2 IMPLANT
SPONGE T-LAP 4X18 ~~LOC~~+RFID (SPONGE) ×2 IMPLANT
SUT PROLENE 4 0 PS 2 18 (SUTURE) IMPLANT
SUT VIC AB 2-0 CT1 27 (SUTURE)
SUT VIC AB 2-0 CT1 TAPERPNT 27 (SUTURE) IMPLANT
SUT VICRYL 4-0 PS2 18IN ABS (SUTURE) IMPLANT
SYR CONTROL 10ML LL (SYRINGE) IMPLANT
TOWEL GREEN STERILE (TOWEL DISPOSABLE) ×2 IMPLANT
TOWEL GREEN STERILE FF (TOWEL DISPOSABLE) IMPLANT
TUBE CONNECTING 12X1/4 (SUCTIONS) ×2 IMPLANT
WATER STERILE IRR 1000ML POUR (IV SOLUTION) ×2 IMPLANT
YANKAUER SUCT BULB TIP NO VENT (SUCTIONS) IMPLANT

## 2022-01-24 NOTE — Discharge Instructions (Addendum)
? ? ?  Audria Nine, M.D. ?Hand Surgery ? ?POST-OPERATIVE DISCHARGE INSTRUCTIONS ? ? ?PRESCRIPTIONS: ?You may have been given a prescription to be taken as directed for post-operative pain control.  You may also take over the counter ibuprofen/aleve and tylenol for pain. Take this as directed on the packaging. Do not exceed 3000 mg tylenol/acetaminophen in 24 hours. ? ?Ibuprofen 600-800 mg (3-4) tablets by mouth every 6 hours as needed for pain.  ?OR ?Aleve 2 tablets by mouth every 12 hours (twice daily) as needed for pain.  ?AND/OR ?Tylenol 1000 mg (2 tablets) every 8 hours as needed for pain. ? ?Please use your pain medication carefully, as refills are limited and you may not be provided with one.  As stated above, please use over the counter pain medicine - it will also be helpful with decreasing your swelling.  ? ? ?ANESTHESIA: ?After your surgery, post-surgical discomfort or pain is likely. This discomfort can last several days to a few weeks. At certain times of the day your discomfort may be more intense.  ? ?Did you receive a nerve block?  ?A nerve block can provide pain relief for one hour to two days after your surgery. As long as the nerve block is working, you will experience little or no sensation in the area the surgeon operated on.  ?As the nerve block wears off, you will begin to experience pain or discomfort. It is very important that you begin taking your prescribed pain medication before the nerve block fully wears off. Treating your pain at the first sign of the block wearing off will ensure your pain is better controlled and more tolerable when full-sensation returns. Do not wait until the pain is intolerable, as the medicine will be less effective. It is better to treat pain in advance than to try and catch up.  ? ?General Anesthesia:  ?If you did not receive a nerve block during your surgery, you will need to start taking your pain medication shortly after your surgery and should continue  to do so as prescribed by your surgeon.   ? ? ?ICE AND ELEVATION: ?You may use ice for the first 48-72 hours, but it is not critical.   ?Elevation, as much as possible for the next 48 hours, is critical for decreasing swelling as well as for pain relief. Elevation means when you are seated or lying down, you hand should be at or above your heart. When walking, the hand needs to be at or above the level of your elbow.  ?If the bandage gets too tight, it may need to be loosened. Please contact our office and we will instruct you in how to do this.  ? ? ?SURGICAL BANDAGES:  ?Keep your dressing and/or splint clean and dry at all times.  Do not remove until you are seen again in the office.  If careful, you may place a plastic bag over your bandage and tape the end to shower, but be careful, do not get your bandages wet.  ?  ? ?HAND THERAPY:  ?You may not need any. If you do, we will begin this at your follow up visit in the clinic.  ? ? ?[facility] ?[address] ?[place],    [address] ?[phone]  ?

## 2022-01-24 NOTE — Anesthesia Procedure Notes (Signed)
Procedure Name: LMA Insertion ?Date/Time: 01/24/2022 7:56 AM ?Performed by: Michele Rockers, CRNA ?Pre-anesthesia Checklist: Patient identified, Emergency Drugs available, Suction available, Timeout performed and Patient being monitored ?Patient Re-evaluated:Patient Re-evaluated prior to induction ?Oxygen Delivery Method: Circle system utilized ?Preoxygenation: Pre-oxygenation with 100% oxygen ?Induction Type: IV induction ?LMA: LMA inserted ?LMA Size: 5.0 ?Number of attempts: 1 ?Placement Confirmation: breath sounds checked- equal and bilateral and CO2 detector ?Tube secured with: Tape ?Dental Injury: Teeth and Oropharynx as per pre-operative assessment  ? ? ? ? ?

## 2022-01-24 NOTE — Transfer of Care (Signed)
Immediate Anesthesia Transfer of Care Note ? ?Patient: Brandon [name] ? ?Procedure(s) Performed: RIGHT OPEN REDUCTION INTERNAL FIXATION (ORIF) SCAPHOID FRACTURE (Right) ? ?Patient Location: PACU ? ?Anesthesia Type:GA combined with regional for post-op pain ? ?Level of Consciousness: drowsy and patient cooperative ? ?Airway & Oxygen Therapy: Patient Spontanous Breathing and Patient connected to face mask oxygen ? ?Post-op Assessment: Report given to RN and Post -op Vital signs reviewed and stable ? ?Post vital signs: Reviewed and stable ? ?Last Vitals:  ?Vitals Value Taken Time  ?BP    ?Temp    ?Pulse 78 01/24/22 0917  ?Resp 13 01/24/22 0917  ?SpO2 97 % 01/24/22 0917  ?Vitals shown include unvalidated device data. ? ?Last Pain:  ?Vitals:  ? 01/24/22 0605  ?TempSrc:   ?PainSc: 0-No pain  ?   ? ?Patients Stated Pain Goal: 0 (01/24/22 1833) ? ?Complications: No notable events documented. ?

## 2022-01-24 NOTE — Op Note (Signed)
? ?Date of Surgery: 01/24/2022 ? ?INDICATIONS: Brandon [name] is a 76 y.o.-year-old male with right scaphoid waist fracture that occurred approximately 7 weeks ago.  Risks, benefits, and alternatives to surgery were again discussed with the patient wishing to proceed with surgery.  Informed consent was signed after our discussion.  ? ?PREOPERATIVE DIAGNOSIS:  ?Right nondisplaced scaphoid waist fracture ? ?POSTOPERATIVE DIAGNOSIS: Same. ? ?PROCEDURE:  ?Open reduction internal fixation of right scaphoid ? ? ?SURGEON: Audria Nine, M.D. ? ?ASSIST:  ? ?ANESTHESIA:  general, regional ? ?IV FLUIDS AND URINE: See anesthesia. ? ?ESTIMATED BLOOD LOSS: <5 mL. ? ?IMPLANTS:  ?Implant Name Type Inv. Item Serial No. Manufacturer Lot No. LRB No. Used Action  ?SCREW ACUTRAK 2 MINI 20MM - [id number] Screw SCREW ACUTRAK 2 MINI 20MM  [employer]  Right 1 Implanted  ?  ? ?DRAINS: None ? ?COMPLICATIONS: None ? ?DESCRIPTION OF PROCEDURE: The patient was met in the preoperative holding area where the surgical site was marked and the consent form was verified.  The patient was then taken to the operating room and transferred to the operating table.  All bony prominences were well padded.  A tourniquet was applied to the right upper arm.  General endotracheal anesthesia was induced.  The operative extremity was prepped and draped in the usual and sterile fashion.  A formal time-out was performed to confirm that this was the correct patient, surgery, side, and site.  ? ?Following timeout, the limb was exsanguinated with an Esmarch bandage and the tourniquet inflated 250 mmHg.  A longitudinal incision was made just ulnar to Lister's tubercle in line with the third ray.  Skin subcutaneous tissue was divided.  The EPL was identified and was released from the third dorsal compartment and retracted radially.  The EDC tendons were retracted ulnarly.  The capsule at the scapholunate interval was identified and divided.  Care was taken to protect the  underlying scapholunate ligament.  The wrist was flexed over a towel bolster.  The guidewire from the screw was then advanced starting from the proximal aspect of the scaphoid just radial to the membranous portion of the scapholunate ligament.  It was advanced in line with the thumb metacarpal.  Fluoroscopy was used ensure that the guidewire position was centered in the scaphoid on both AP, 30 degree pronated, and lateral views.  With the wire appropriately placed in a subchondral position the screw length was measured.  It measured 26 mm but the guidewire was buried in subchondral bone.  A 22 mm screw was held up to the scaphoid and appeared to be too large.  A 20 mm screw was selected.  The guidewire was then advanced through the trapezium and out the dorsal skin to allow for removal if there was fracture of the wire.  The profile drill was used and then into the proximal aspect of the scaphoid.  This was followed by the other drill to the level of the screw length.  The screw was then slowly advanced across the fracture site.  There was excellent purchase with the trailing end of the screw.  In the subchondral bone of the proximal pole.  The guidewire was successfully removed.  PA, 30 degree pronated, and lateral views showed appropriate position of the scaphoid in the central position.  At this point the joint was thoroughly irrigated with copious sterile saline.  The joint capsule was not closed so as to prevent adhesions and loss of flexion postoperatively.  The retinaculum was closed leaving the  EPL tendon transposed.  The tourniquet was let down hemostasis achieved with bipolar cautery and direct pressure.  The skin was closed with 4-0 horizontal mattress nylon sutures. ? ?The wound was dressed with Xeroform, folded Kerlix, cast padding, and a well-padded volar splint. ? ?The patient was reversed anesthesia and transferred the postoperative bed.  All counts were correct to the procedure.  The patient then  taken the PACU in stable condition. ? ?POSTOPERATIVE PLAN: He will be discharged home with appropriate pain medication and discharge instructions.  I will see him back in the office in 10 to 14 days for his first postop visit at which point the sutures will be removed and he will be transitioned to a short arm cast. ? ?Audria Nine, MD ?9:09 AM  ?

## 2022-01-24 NOTE — Telephone Encounter (Signed)
Patient called stating that he is not able to move his fingers on his right hand.  Stated that is arm is very warm, but concerned about his fingers.  Patient had right ORIF today.  Cb# [phone].  Please advise.  Thank you. ?

## 2022-01-24 NOTE — Anesthesia Postprocedure Evaluation (Signed)
Anesthesia Post Note ? ?Patient: Kieren Adkison ? ?Procedure(s) Performed: RIGHT OPEN REDUCTION INTERNAL FIXATION (ORIF) SCAPHOID FRACTURE (Right) ? ?  ? ?Patient location during evaluation: PACU ?Anesthesia Type: General ?Level of consciousness: awake and alert, oriented and patient cooperative ?Pain management: pain level controlled ?Vital Signs Assessment: post-procedure vital signs reviewed and stable ?Respiratory status: spontaneous breathing, nonlabored ventilation and respiratory function stable ?Cardiovascular status: blood pressure returned to baseline and stable ?Postop Assessment: no apparent nausea or vomiting and adequate PO intake ?Anesthetic complications: no ? ? ?No notable events documented. ? ?Last Vitals:  ?Vitals:  ? 01/24/22 0932 01/24/22 0947  ?BP: 139/82 131/77  ?Pulse: 74 64  ?Resp: 15 15  ?Temp:    ?SpO2: 94% 92%  ?  ?Last Pain:  ?Vitals:  ? 01/24/22 0917  ?TempSrc:   ?PainSc: Asleep  ? ? ?  ?  ?  ?  ?  ?  ? ?Ingris Pasquarella,E. Camara Renstrom ? ? ? ? ?

## 2022-01-24 NOTE — Interval H&P Note (Signed)
History and Physical Interval Note: ? ?01/24/2022 ?7:36 AM ? ?Brandon [name]  has presented today for surgery, with the diagnosis of RIGHT SCAPHOID FRACTURE.  The various methods of treatment have been discussed with the patient and family. After consideration of risks, benefits and other options for treatment, the patient has consented to  Procedure(s): ?RIGHT OPEN REDUCTION INTERNAL FIXATION (ORIF) SCAPHOID FRACTURE (Right) as a surgical intervention.  The patient's history has been reviewed, patient examined, no change in status, stable for surgery.  I have reviewed the patient's chart and labs.  Questions were answered to the patient's satisfaction.   ? ? ?Brandon [name] ? ? ?

## 2022-01-24 NOTE — Anesthesia Procedure Notes (Signed)
Anesthesia Regional Block: Supraclavicular block  ? ?Pre-Anesthetic Checklist: , timeout performed,  Correct Patient, Correct Site, Correct Laterality,  Correct Procedure, Correct Position, site marked,  Risks and benefits discussed,  Surgical consent,  Pre-op evaluation,  At surgeon's request and post-op pain management ? ?Laterality: Right and Upper ? ?Prep: chloraprep     ?  ?Needles:  ?Injection technique: Single-shot ? ?Needle Type: Echogenic Needle   ? ? ?Needle Length: 9cm  ?Needle Gauge: 21  ? ? ? ?Additional Needles: ? ? ?Procedures:,,,, ultrasound used (permanent image in chart),,    ?Narrative:  ?Start time: 01/24/2022 7:01 AM ?End time: 01/24/2022 7:07 AM ?Injection made incrementally with aspirations every 5 mL. ? ?Performed by: Personally  ?Anesthesiologist: Annye Asa, MD ? ?Additional Notes: ?Pt identified in Holding room.  Monitors applied. Working IV access confirmed. Sterile prep R clavicle and neck.  #21ga ECHOgenic Arrow block needle to supraclav brachial plexus with US guidance.  25cc 0.5% Bupivacaine with 1:200k epi injected incrementally after negative test dose.  Patient asymptomatic, VSS, no heme aspirated, tolerated well.   Jenita Seashore, MD ? ? ? ? ?

## 2022-01-24 NOTE — Brief Op Note (Signed)
01/24/2022 ? ?9:06 AM ? ?PATIENT:  Brandon [name]  76 y.o. male ? ?PRE-OPERATIVE DIAGNOSIS:  RIGHT SCAPHOID FRACTURE ? ?POST-OPERATIVE DIAGNOSIS:  RIGHT SCAPHOID FRACTURE ? ?PROCEDURE:  Procedure(s): ?RIGHT OPEN REDUCTION INTERNAL FIXATION (ORIF) SCAPHOID FRACTURE (Right) ? ?SURGEON:  Surgeon(s) and Role: ?   * Sherilyn Cooter, MD - Primary ? ?PHYSICIAN ASSISTANT:  ? ?ASSISTANTS: none  ? ?ANESTHESIA:   regional and MAC ? ?EBL:  <5 mL  ? ?BLOOD ADMINISTERED:none ? ?DRAINS: none  ? ?LOCAL MEDICATIONS USED:  NONE ? ?SPECIMEN:  No Specimen ? ?DISPOSITION OF SPECIMEN:  N/A ? ?COUNTS:  YES ? ?TOURNIQUET:   ?Total Tourniquet Time Documented: ?Upper Arm (Right) - 48 minutes ?Total: Upper Arm (Right) - 48 minutes ? ? ?DICTATION: .Dragon Dictation ? ?PLAN OF CARE: Discharge to home after PACU ? ?PATIENT DISPOSITION:  PACU - hemodynamically stable. ?  ?Delay start of Pharmacological VTE agent (>24hrs) due to surgical blood loss or risk of bleeding: not applicable ? ?

## 2022-01-26 ENCOUNTER — Encounter (HOSPITAL_COMMUNITY): Payer: Self-pay | Admitting: Orthopedic Surgery

## 2022-01-29 ENCOUNTER — Telehealth: Payer: Self-pay

## 2022-01-29 NOTE — Telephone Encounter (Signed)
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

## 2022-01-29 NOTE — Telephone Encounter (Signed)
Patient called wanting to know if there is any reason he can't push a wheelchair around?  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  Patient had right wrist surgery on 01/24/2022. Cb# [phone].  Please advise.  Thank you. ?

## 2022-01-30 NOTE — Telephone Encounter (Signed)
I called and advised patient and he will wait to come back and get his cast  ?

## 2022-02-05 ENCOUNTER — Encounter: Payer: Medicare Other | Admitting: Orthopedic Surgery

## 2022-02-08 ENCOUNTER — Ambulatory Visit: Payer: Self-pay

## 2022-02-08 ENCOUNTER — Ambulatory Visit (INDEPENDENT_AMBULATORY_CARE_PROVIDER_SITE_OTHER): Payer: Medicare Other | Admitting: Orthopedic Surgery

## 2022-02-08 ENCOUNTER — Encounter: Payer: Self-pay | Admitting: Orthopedic Surgery

## 2022-02-08 DIAGNOSIS — S62024A Nondisplaced fracture of middle third of navicular [scaphoid] bone of right wrist, initial encounter for closed fracture: Secondary | ICD-10-CM

## 2022-02-08 NOTE — Progress Notes (Signed)
? ?  Post-Op Visit Note ?  ?Patient: Brandon [name]           ?Date of Birth: Jan 01, 1946           ?MRN: [id number] ?Visit Date: 02/08/2022 ?PCP: Virgie Dad, MD ? ? ?Assessment & Plan: ? ?Chief Complaint:  ?Chief Complaint  ?Patient presents with  ? Right Wrist - Routine Post Op  ? ?Visit Diagnoses:  ?1. Closed nondisplaced fracture of middle third of scaphoid bone of right wrist, initial encounter   ? ? ?Plan: Patient is now 2 weeks out status post ORIF right scaphoid waist fracture with a headless compression screw.  He is doing well postoperatively.  His wound is well-healed.  Sutures were removed today.  X-rays show appropriate hardware position and fracture alignment.  We will put him in a short arm cast today.  I will see him back in a month for repeat x-rays. ? ?Follow-Up Instructions: No follow-ups on file.  ? ?Orders:  ?Orders Placed This Encounter  ?Procedures  ? XR Wrist Complete Right  ? ?No orders of the defined types were placed in this encounter. ? ? ?Imaging: ?No results found. ? ?PMFS History: ?Patient Active Problem List  ? Diagnosis Date Noted  ? Nondisplaced fracture of right scaphoid bone 01/18/2022  ? Degenerative disc disease, cervical 06/20/2020  ? Slow urinary stream 05/16/2017  ? Nocturia more than twice per night 05/16/2017  ? Thumb tendonitis 04/06/2016  ? Cervicalgia 03/29/2015  ? Essential hypertension 12/17/2014  ? Vitamin D deficiency 12/17/2014  ? Thrombocytopenia ([place]) 12/17/2014  ? History of colonic polyps 12/17/2014  ? Hyperlipidemia 12/17/2014  ? Counseling regarding advanced care planning and goals of care 12/17/2014  ? ?Past Medical History:  ?Diagnosis Date  ? Arthritis   ? neck  ? Cataract   ? Cervicalgia 03/29/2015  ? History of colonic polyps   ? Hyperlipidemia   ? Hypertension   ? Thrombocytopenia ([place])   ? Vitamin D deficiency   ?  ?Family History  ?Problem Relation Age of Onset  ? Clotting disorder Father   ? Suicidality Father   ? High Cholesterol Mother   ? Renal  Disease Mother   ? Stroke Mother 43  ? Colon cancer Neg Hx   ? Esophageal cancer Neg Hx   ? Stomach cancer Neg Hx   ? Rectal cancer Neg Hx   ?  ?Past Surgical History:  ?Procedure Laterality Date  ? ARM SKIN LESION BIOPSY / EXCISION Left 02/2016  ? atypical lentigmous and nested melanocytic proliferation--Dr. Harriett Sine  ? CATARACT EXTRACTION Bilateral january and february 2018  ? ORIF SCAPHOID FRACTURE Right 01/24/2022  ? Procedure: RIGHT OPEN REDUCTION INTERNAL FIXATION (ORIF) SCAPHOID FRACTURE;  Surgeon: Sherilyn Cooter, MD;  Location: [facility];  Service: Orthopedics;  Laterality: Right;  ? ?Social History  ? ?Occupational History  ? Not on file  ?Tobacco Use  ? Smoking status: Never  ? Smokeless tobacco: Never  ?Vaping Use  ? Vaping Use: Never used  ?Substance and Sexual Activity  ? Alcohol use: Not Currently  ?  Comment: ocassional  ? Drug use: No  ? Sexual activity: Not Currently  ? ? ? ?

## 2022-03-08 ENCOUNTER — Other Ambulatory Visit: Payer: Self-pay | Admitting: Internal Medicine

## 2022-03-08 DIAGNOSIS — I10 Essential (primary) hypertension: Secondary | ICD-10-CM

## 2022-03-08 DIAGNOSIS — E782 Mixed hyperlipidemia: Secondary | ICD-10-CM

## 2022-03-12 ENCOUNTER — Ambulatory Visit (INDEPENDENT_AMBULATORY_CARE_PROVIDER_SITE_OTHER): Payer: Medicare Other | Admitting: Orthopedic Surgery

## 2022-03-12 ENCOUNTER — Ambulatory Visit: Payer: Self-pay

## 2022-03-12 DIAGNOSIS — S62024D Nondisplaced fracture of middle third of navicular [scaphoid] bone of right wrist, subsequent encounter for fracture with routine healing: Secondary | ICD-10-CM

## 2022-03-12 DIAGNOSIS — S62024A Nondisplaced fracture of middle third of navicular [scaphoid] bone of right wrist, initial encounter for closed fracture: Secondary | ICD-10-CM

## 2022-03-12 NOTE — Progress Notes (Signed)
? ?  Post-Op Visit Note ?  ?Patient: Brandon [name]           ?Date of Birth: 05-13-1946           ?MRN: [id number] ?Visit Date: 03/12/2022 ?PCP: Virgie Dad, MD ? ? ?Assessment & Plan: ? ?Chief Complaint:  ?Chief Complaint  ?Patient presents with  ? Right Wrist - Routine Post Op  ? ?Visit Diagnoses:  ?1. Closed nondisplaced fracture of middle third of scaphoid bone of right wrist, initial encounter   ? ? ?Plan: Patient is 6.5 weeks out from ORIF of his right scaphoid waist fracture.  He has been in a cast.  Repeat x-rays today show interval healing of the fracture.  We will plan on cast immobilization for another two weeks at least with a CT scan to evaluate fracture healing.  I can see him when the CT scan is completed.  ? ?Follow-Up Instructions: No follow-ups on file.  ? ?Orders:  ?Orders Placed This Encounter  ?Procedures  ? XR Wrist Complete Right  ? ?No orders of the defined types were placed in this encounter. ? ? ?Imaging: ?No results found. ? ?PMFS History: ?Patient Active Problem List  ? Diagnosis Date Noted  ? Nondisplaced fracture of right scaphoid bone 01/18/2022  ? Degenerative disc disease, cervical 06/20/2020  ? Slow urinary stream 05/16/2017  ? Nocturia more than twice per night 05/16/2017  ? Thumb tendonitis 04/06/2016  ? Cervicalgia 03/29/2015  ? Essential hypertension 12/17/2014  ? Vitamin D deficiency 12/17/2014  ? Thrombocytopenia ([place]) 12/17/2014  ? History of colonic polyps 12/17/2014  ? Hyperlipidemia 12/17/2014  ? Counseling regarding advanced care planning and goals of care 12/17/2014  ? ?Past Medical History:  ?Diagnosis Date  ? Arthritis   ? neck  ? Cataract   ? Cervicalgia 03/29/2015  ? History of colonic polyps   ? Hyperlipidemia   ? Hypertension   ? Thrombocytopenia ([place])   ? Vitamin D deficiency   ?  ?Family History  ?Problem Relation Age of Onset  ? Clotting disorder Father   ? Suicidality Father   ? High Cholesterol Mother   ? Renal Disease Mother   ? Stroke Mother 8  ? Colon  cancer Neg Hx   ? Esophageal cancer Neg Hx   ? Stomach cancer Neg Hx   ? Rectal cancer Neg Hx   ?  ?Past Surgical History:  ?Procedure Laterality Date  ? ARM SKIN LESION BIOPSY / EXCISION Left 02/2016  ? atypical lentigmous and nested melanocytic proliferation--Dr. Harriett Sine  ? CATARACT EXTRACTION Bilateral january and february 2018  ? ORIF SCAPHOID FRACTURE Right 01/24/2022  ? Procedure: RIGHT OPEN REDUCTION INTERNAL FIXATION (ORIF) SCAPHOID FRACTURE;  Surgeon: Sherilyn Cooter, MD;  Location: [facility];  Service: Orthopedics;  Laterality: Right;  ? ?Social History  ? ?Occupational History  ? Not on file  ?Tobacco Use  ? Smoking status: Never  ? Smokeless tobacco: Never  ?Vaping Use  ? Vaping Use: Never used  ?Substance and Sexual Activity  ? Alcohol use: Not Currently  ?  Comment: ocassional  ? Drug use: No  ? Sexual activity: Not Currently  ? ? ? ?

## 2022-03-16 DIAGNOSIS — M216X2 Other acquired deformities of left foot: Secondary | ICD-10-CM | POA: Diagnosis not present

## 2022-03-16 DIAGNOSIS — M216X1 Other acquired deformities of right foot: Secondary | ICD-10-CM | POA: Diagnosis not present

## 2022-03-16 DIAGNOSIS — L84 Corns and callosities: Secondary | ICD-10-CM | POA: Diagnosis not present

## 2022-03-22 ENCOUNTER — Ambulatory Visit
Admission: RE | Admit: 2022-03-22 | Discharge: 2022-03-22 | Disposition: A | Discharge status: Home / Self Care | Payer: Medicare Other | Source: Ambulatory Visit | Attending: Orthopedic Surgery | Admitting: Orthopedic Surgery

## 2022-03-22 DIAGNOSIS — M85831 Other specified disorders of bone density and structure, right forearm: Secondary | ICD-10-CM | POA: Diagnosis not present

## 2022-03-22 DIAGNOSIS — S62001A Unspecified fracture of navicular [scaphoid] bone of right wrist, initial encounter for closed fracture: Secondary | ICD-10-CM | POA: Diagnosis not present

## 2022-03-22 DIAGNOSIS — S62024D Nondisplaced fracture of middle third of navicular [scaphoid] bone of right wrist, subsequent encounter for fracture with routine healing: Secondary | ICD-10-CM

## 2022-03-27 ENCOUNTER — Ambulatory Visit (INDEPENDENT_AMBULATORY_CARE_PROVIDER_SITE_OTHER): Payer: Medicare Other | Admitting: Orthopedic Surgery

## 2022-03-27 DIAGNOSIS — S62024A Nondisplaced fracture of middle third of navicular [scaphoid] bone of right wrist, initial encounter for closed fracture: Secondary | ICD-10-CM

## 2022-03-27 NOTE — Progress Notes (Signed)
   Post-Op Visit Note   Patient: Brandon [name]           Date of Birth: 1946-06-10           MRN: [id number] Visit Date: 03/27/2022 PCP: Virgie Dad, MD   Assessment & Plan:  Chief Complaint:  Chief Complaint  Patient presents with   Right Wrist - Follow-up   Visit Diagnoses:  1. Closed nondisplaced fracture of middle third of scaphoid bone of right wrist, initial encounter     Plan: Patient is just under 9 weeks status post ORIF of the right scaphoid fracture.  He had a CT scan done approximately one week ago which showed some evidence of fracture healing with periosteal reaction at the ulnar aspect of the screw.  We will continue cast immobilization for another 2 weeks or so at which point we will obtain another x-ray to see if there is been interval healing.  Follow-Up Instructions: No follow-ups on file.   Orders:  No orders of the defined types were placed in this encounter.  No orders of the defined types were placed in this encounter.   Imaging: No results found.  PMFS History: Patient Active Problem List   Diagnosis Date Noted   Nondisplaced fracture of right scaphoid bone 01/18/2022   Degenerative disc disease, cervical 06/20/2020   Slow urinary stream 05/16/2017   Nocturia more than twice per night 05/16/2017   Thumb tendonitis 04/06/2016   Cervicalgia 03/29/2015   Essential hypertension 12/17/2014   Vitamin D deficiency 12/17/2014   Thrombocytopenia ([place]) 12/17/2014   History of colonic polyps 12/17/2014   Hyperlipidemia 12/17/2014   Counseling regarding advanced care planning and goals of care 12/17/2014   Past Medical History:  Diagnosis Date   Arthritis    neck   Cataract    Cervicalgia 03/29/2015   History of colonic polyps    Hyperlipidemia    Hypertension    Thrombocytopenia ([place])    Vitamin D deficiency     Family History  Problem Relation Age of Onset   Clotting disorder Father    Suicidality Father    High Cholesterol Mother     Renal Disease Mother    Stroke Mother 3   Colon cancer Neg Hx    Esophageal cancer Neg Hx    Stomach cancer Neg Hx    Rectal cancer Neg Hx     Past Surgical History:  Procedure Laterality Date   ARM SKIN LESION BIOPSY / EXCISION Left 02/2016   atypical lentigmous and nested melanocytic proliferation--Dr. Harriett Sine   CATARACT EXTRACTION Bilateral january and february 2018   ORIF SCAPHOID FRACTURE Right 01/24/2022   Procedure: RIGHT OPEN REDUCTION INTERNAL FIXATION (ORIF) SCAPHOID FRACTURE;  Surgeon: Sherilyn Cooter, MD;  Location: [facility];  Service: Orthopedics;  Laterality: Right;   Social History   Occupational History   Not on file  Tobacco Use   Smoking status: Never   Smokeless tobacco: Never  Vaping Use   Vaping Use: Never used  Substance and Sexual Activity   Alcohol use: Not Currently    Comment: ocassional   Drug use: No   Sexual activity: Not Currently

## 2022-03-30 ENCOUNTER — Ambulatory Visit (INDEPENDENT_AMBULATORY_CARE_PROVIDER_SITE_OTHER): Payer: Medicare Other | Admitting: Family

## 2022-03-30 ENCOUNTER — Encounter: Payer: Self-pay | Admitting: Family

## 2022-03-30 VITALS — BP 120/70 | HR 74 | Temp 97.8°F | Resp 20 | Ht 73.0 in | Wt 196.4 lb

## 2022-03-30 DIAGNOSIS — J029 Acute pharyngitis, unspecified: Secondary | ICD-10-CM

## 2022-03-30 MED ORDER — AZITHROMYCIN 250 MG PO TABS: ORAL_TABLET | ORAL | 0 refills | Status: DC

## 2022-03-30 NOTE — Patient Instructions (Signed)
-   Gurgle throat with warm water and salt at least once daily   - take OTC tylenol as needed for pain   - Increase fluid intake/warm tea or soup   - Notify provider if symptoms worsen or fail to improve

## 2022-03-30 NOTE — Progress Notes (Signed)
Provider: Kimorah Ridolfi FNP-C  Virgie Dad, MD  Patient Care Team: Virgie Dad, MD as PCP - General (Internal Medicine)  Extended Emergency Contact Information Primary Emergency Contact: Knott,Margery Address: [address]          [place], [place] [address] Johnnette Litter of [place] Mobile Phone: [phone] Relation: None  Code Status:  DNR Goals of care: Advanced Directive information    03/30/2022    2:08 PM  Advanced Directives  Does Patient Have a Medical Advance Directive? Yes  Type of Paramedic of [place];Living will  Does patient want to make changes to medical advance directive? No - Patient declined     Chief Complaint  Patient presents with   Acute Visit    URI symptoms x's 2 days    HPI:  Pt is a 76 y.o. male seen today for an acute visit for evaluation of cough and sore throat x 2 days.Associated with headache.  He denies any fever,chills,fatigue,body aches,runny nose,chest tightness,chest pain,palpitation or shortness of breath. Wife also has similar symptoms and her COVID-19 was negative so patient did not test his.     Past Medical History:  Diagnosis Date   Arthritis    neck   Cataract    Cervicalgia 03/29/2015   History of colonic polyps    Hyperlipidemia    Hypertension    Thrombocytopenia ([facility])    Vitamin D deficiency    Past Surgical History:  Procedure Laterality Date   ARM SKIN LESION BIOPSY / EXCISION Left 02/2016   atypical lentigmous and nested melanocytic proliferation--Dr. Harriett Sine   CATARACT EXTRACTION Bilateral january and february 2018   ORIF SCAPHOID FRACTURE Right 01/24/2022   Procedure: RIGHT OPEN REDUCTION INTERNAL FIXATION (ORIF) SCAPHOID FRACTURE;  Surgeon: Sherilyn Cooter, MD;  Location: [facility];  Service: Orthopedics;  Laterality: Right;    Allergies  Allergen Reactions   Zocor [Simvastatin] Other (See Comments)    Memory loss   Niaspan [Niacin Er] Hives    Outpatient  Encounter Medications as of 03/30/2022  Medication Sig   aspirin EC 81 MG tablet Take 81 mg by mouth daily.   Cholecalciferol 50 MCG (2000 UT) TABS Take 2,000 Units by mouth daily.   fenofibrate 160 MG tablet Take 1 tablet (160 mg total) by mouth daily.   hydrochlorothiazide (HYDRODIURIL) 25 MG tablet Take 1 tablet (25 mg total) by mouth daily.   lisinopril (ZESTRIL) 40 MG tablet Take 1 tablet (40 mg total) by mouth daily.   No facility-administered encounter medications on file as of 03/30/2022.    Review of Systems  Constitutional:  Negative for appetite change, chills, fatigue, fever and unexpected weight change.  HENT:  Positive for rhinorrhea. Negative for congestion, dental problem, ear discharge, ear pain, facial swelling, hearing loss, nosebleeds, postnasal drip, sinus pressure, sinus pain, sneezing, sore throat, tinnitus and trouble swallowing.   Eyes:  Negative for pain, discharge, redness, itching and visual disturbance.  Respiratory:  Positive for cough. Negative for chest tightness, shortness of breath and wheezing.   Cardiovascular:  Negative for chest pain, palpitations and leg swelling.  Gastrointestinal:  Negative for abdominal distention, abdominal pain, blood in stool, constipation, diarrhea, nausea and vomiting.  Skin:  Negative for color change, pallor and rash.  Neurological:  Positive for headaches. Negative for dizziness, weakness, light-headedness and numbness.   Immunization History  Administered Date(s) Administered   Fluad Quad(high Dose 65+) 06/23/2019   Influenza, High Dose Seasonal PF 08/03/2021   Influenza,inj,Quad  PF,6+ Mos 07/25/2015, 08/03/2016, 07/29/2018   Influenza-Unspecified 08/29/2017   PFIZER Comirnaty(Gray Top)Covid-19 Tri-Sucrose Vaccine 02/17/2021   PFIZER(Purple Top)SARS-COV-2 Vaccination 12/03/2019, 12/28/2019, 07/26/2020   Pneumococcal Conjugate-13 03/21/2015   Pneumococcal Polysaccharide-23 04/06/2016   Tdap 10/30/2007, 05/27/2018   Zoster  Recombinat (Shingrix) 03/08/2018, 05/27/2018   Zoster, Live 10/29/2012   Pertinent  Health Maintenance Due  Topic Date Due   INFLUENZA VACCINE  05/29/2022   COLONOSCOPY (Pts 45-5yr Insurance coverage will need to be confirmed)  06/24/2024      10/16/2021    1:17 PM 12/01/2021   11:20 AM 12/06/2021    1:35 PM 01/24/2022    6:07 AM 03/30/2022    2:08 PM  Fall Risk  Falls in the past year? 0 1 1  0  Was there an injury with Fall? 0 1 0  0  Fall Risk Category Calculator 0 3 2  0  Fall Risk Category Low High Moderate  Low  Patient Fall Risk Level Low fall risk High fall risk Low fall risk High fall risk Low fall risk  Patient at Risk for Falls Due to No Fall Risks History of fall(s) Other (Comment)  No Fall Risks  Fall risk Follow up Falls evaluation completed Falls evaluation completed;Education provided;Falls prevention discussed Falls evaluation completed  Falls evaluation completed   Functional Status Survey:    Vitals:   03/30/22 1409  BP: 120/70  Pulse: 74  Resp: 20  Temp: 97.8 F (36.6 C)  TempSrc: Tympanic  SpO2: 94%  Weight: 196 lb 6.4 oz (89.1 kg)  Height: '6\' 1"'$  (1.854 m)   Body mass index is 25.91 kg/m. Physical Exam Vitals reviewed.  Constitutional:      General: He is not in acute distress.    Appearance: Normal appearance. He is normal weight. He is not ill-appearing or diaphoretic.  HENT:     Head: Normocephalic.     Right Ear: Tympanic membrane, ear canal and external ear normal. There is no impacted cerumen.     Left Ear: Tympanic membrane, ear canal and external ear normal. There is no impacted cerumen.     Nose: No congestion or rhinorrhea.     Right Turbinates: Not enlarged, swollen or pale.     Left Turbinates: Not enlarged or swollen.     Right Sinus: Frontal sinus tenderness present. No maxillary sinus tenderness.     Left Sinus: Frontal sinus tenderness present. No maxillary sinus tenderness.     Mouth/Throat:     Mouth: Mucous membranes are  moist.     Pharynx: Oropharynx is clear. Posterior oropharyngeal erythema present. No pharyngeal swelling or oropharyngeal exudate.  Eyes:     General: No scleral icterus.       Right eye: No discharge.        Left eye: No discharge.     Conjunctiva/sclera: Conjunctivae normal.     Pupils: Pupils are equal, round, and reactive to light.  Neck:     Vascular: No carotid bruit.  Cardiovascular:     Rate and Rhythm: Normal rate and regular rhythm.     Pulses: Normal pulses.     Heart sounds: Normal heart sounds. No murmur heard.   No friction rub. No gallop.  Pulmonary:     Effort: Pulmonary effort is normal. No respiratory distress.     Breath sounds: Normal breath sounds. No wheezing, rhonchi or rales.  Chest:     Chest wall: No tenderness.  Abdominal:     General: Bowel sounds are  normal. There is no distension.     Palpations: Abdomen is soft. There is no mass.     Tenderness: There is no abdominal tenderness. There is no guarding.  Musculoskeletal:        General: No swelling or tenderness. Normal range of motion.     Cervical back: Normal range of motion. No rigidity or tenderness.     Right lower leg: No edema.     Left lower leg: No edema.     Comments: Right forearm cast in place   Lymphadenopathy:     Cervical: No cervical adenopathy.  Skin:    General: Skin is warm and dry.     Coloration: Skin is not pale.     Findings: No erythema or rash.  Neurological:     Mental Status: He is alert and oriented to person, place, and time.     Motor: No weakness.     Gait: Gait normal.  Psychiatric:        Speech: Speech normal.    Labs reviewed: Recent Labs    05/30/21 0848 12/04/21 0800 01/24/22 0554  NA 143 141 141  K 4.1 3.7 3.6  CL 106 104 111  CO2 '29 29 23  '$ GLUCOSE 87 72 100*  BUN 29* 20 22  CREATININE 1.20 1.06 1.09  CALCIUM 9.7 9.3 9.1   Recent Labs    05/30/21 0848 12/04/21 0800  AST 19 21  ALT 17 21  BILITOT 0.7 0.7  PROT 6.4 6.2   Recent Labs     05/30/21 0848 12/04/21 0800 01/24/22 0554  WBC 5.8 5.6 4.7  NEUTROABS 3,944 3,830  --   HGB 16.0 15.3 14.6  HCT 47.1 44.5 42.8  MCV 90.9 87.4 88.8  PLT 163 159 127*   Lab Results  Component Value Date   TSH 2.07 12/04/2021   No results found for: HGBA1C Lab Results  Component Value Date   CHOL 137 12/04/2021   HDL 44 12/04/2021   LDLCALC 76 12/04/2021   TRIG 83 12/04/2021   CHOLHDL 3.1 12/04/2021    Significant Diagnostic Results in last 30 days:  CT WRIST RIGHT WO CONTRAST  Result Date: 03/22/2022 CLINICAL DATA:  Prior scaphoid fracture ORIF. Evaluate for interval fracture healing. EXAM: CT OF THE RIGHT WRIST WITHOUT CONTRAST TECHNIQUE: Multidetector CT imaging of the right wrist was performed according to the standard protocol. Multiplanar CT image reconstructions were also generated. RADIATION DOSE REDUCTION: This exam was performed according to the departmental dose-optimization program which includes automated exposure control, adjustment of the mA and/or kV according to patient size and/or use of iterative reconstruction technique. COMPARISON:  CT examination dated January 12, 2022 FINDINGS: Bones/Joint/Cartilage Status post ORIF with a single cancellous screw through the scaphoid. There is persistent fracture line on the radial aspect of the scaphoid. There is periosteal reaction and some callus formation about the ulnar aspect of the screw. There is diffuse osteopenia. There are cystic changes of the ulna. No other epiphyseal fracture. Alignment is normal. No evidence of sclerosis of the scaphoid to suggest AVN. Ligaments Suboptimally assessed by CT. Muscles and Tendons Muscles are normal in bulk.  The visualized tendons are intact. Soft tissues Skin and subcutaneous soft tissues are within normal limits. IMPRESSION: 1. Status post ORIF for prior scaphoid waist fracture. Persistent fracture line about the radial aspect of the screw. Mild periosteal reaction with minimal callus  formation about the ulnar aspect concerning for partial healing. Continued follow-up is recommended. 2. No evidence  of sclerosis to suggest AVN of the proximal scaphoid. 3. Osteopenia of the carpal bones, which are however normal in alignment. 4.  Muscles, tendons and subcutaneous soft tissues are unremarkable. Electronically Signed   By: Keane Police D.O.   On: 03/22/2022 23:44    Assessment/Plan  Acute pharyngitis, unspecified etiology Afebrile  Posterior oral pharynx erythema without any exudate noted. Sinus tenderness also noted. - start on Z-pak  - OTC cough syrup as needed for cough  - notify provider if symptoms worsen or fail to improved   Family/ staff Communication: Reviewed plan of care with patient verbalized understanding.  Labs/tests ordered: None   Next Appointment: As needed if symptoms worsen or fail to improve    Sandrea Hughs, NP

## 2022-04-10 ENCOUNTER — Ambulatory Visit (INDEPENDENT_AMBULATORY_CARE_PROVIDER_SITE_OTHER): Payer: Medicare Other

## 2022-04-10 ENCOUNTER — Ambulatory Visit (INDEPENDENT_AMBULATORY_CARE_PROVIDER_SITE_OTHER): Payer: Medicare Other | Admitting: Orthopedic Surgery

## 2022-04-10 DIAGNOSIS — S62024A Nondisplaced fracture of middle third of navicular [scaphoid] bone of right wrist, initial encounter for closed fracture: Secondary | ICD-10-CM | POA: Diagnosis not present

## 2022-04-10 NOTE — Progress Notes (Signed)
   Post-Op Visit Note   Patient: Brandon [name]           Date of Birth: 1945/12/27           MRN: [id number] Visit Date: 04/10/2022 PCP: Virgie Dad, MD   Assessment & Plan:  Chief Complaint:  Chief Complaint  Patient presents with   Right Wrist - Follow-up, Fracture   Visit Diagnoses:  1. Closed nondisplaced fracture of middle third of scaphoid bone of right wrist, initial encounter     Plan: Patient is approximately 11 weeks s/p dorsal screw fixation of right scaphoid fracture.  He is doing well postoperatively.  He has no pain in his wrist. He will wear his removable thumb spica brace with activity. I will refer him to therapy to work on ROM and strengthening. I can see him back in another month with repeat x-rays.   Follow-Up Instructions: No follow-ups on file.   Orders:  Orders Placed This Encounter  Procedures   XR Wrist Complete Right   No orders of the defined types were placed in this encounter.   Imaging: No results found.  PMFS History: Patient Active Problem List   Diagnosis Date Noted   Nondisplaced fracture of right scaphoid bone 01/18/2022   Degenerative disc disease, cervical 06/20/2020   Slow urinary stream 05/16/2017   Nocturia more than twice per night 05/16/2017   Thumb tendonitis 04/06/2016   Cervicalgia 03/29/2015   Essential hypertension 12/17/2014   Vitamin D deficiency 12/17/2014   Thrombocytopenia ([facility]) 12/17/2014   History of colonic polyps 12/17/2014   Hyperlipidemia 12/17/2014   Counseling regarding advanced care planning and goals of care 12/17/2014   Past Medical History:  Diagnosis Date   Arthritis    neck   Cataract    Cervicalgia 03/29/2015   History of colonic polyps    Hyperlipidemia    Hypertension    Thrombocytopenia ([facility])    Vitamin D deficiency     Family History  Problem Relation Age of Onset   Clotting disorder Father    Suicidality Father    High Cholesterol Mother    Renal Disease Mother    Stroke  Mother 58   Colon cancer Neg Hx    Esophageal cancer Neg Hx    Stomach cancer Neg Hx    Rectal cancer Neg Hx     Past Surgical History:  Procedure Laterality Date   ARM SKIN LESION BIOPSY / EXCISION Left 02/2016   atypical lentigmous and nested melanocytic proliferation--Dr. Harriett Sine   CATARACT EXTRACTION Bilateral january and february 2018   ORIF SCAPHOID FRACTURE Right 01/24/2022   Procedure: RIGHT OPEN REDUCTION INTERNAL FIXATION (ORIF) SCAPHOID FRACTURE;  Surgeon: Sherilyn Cooter, MD;  Location: [facility];  Service: Orthopedics;  Laterality: Right;   Social History   Occupational History   Not on file  Tobacco Use   Smoking status: Never   Smokeless tobacco: Never  Vaping Use   Vaping Use: Never used  Substance and Sexual Activity   Alcohol use: Not Currently    Comment: ocassional   Drug use: No   Sexual activity: Not Currently

## 2022-04-11 DIAGNOSIS — Z23 Encounter for immunization: Secondary | ICD-10-CM | POA: Diagnosis not present

## 2022-04-19 ENCOUNTER — Other Ambulatory Visit: Payer: Self-pay | Admitting: Orthopedic Surgery

## 2022-04-19 ENCOUNTER — Telehealth: Payer: Self-pay | Admitting: Orthopedic Surgery

## 2022-04-19 DIAGNOSIS — S62024A Nondisplaced fracture of middle third of navicular [scaphoid] bone of right wrist, initial encounter for closed fracture: Secondary | ICD-10-CM

## 2022-04-19 NOTE — Telephone Encounter (Signed)
Patient called. He would like a referral for OT.

## 2022-04-19 NOTE — Telephone Encounter (Signed)
Patient is wanting a referral for OT, he is currently being treated for Closed nondisplaced fracture of middle third of scaphoid bone of right wrist

## 2022-04-25 ENCOUNTER — Other Ambulatory Visit: Payer: Self-pay

## 2022-04-25 ENCOUNTER — Ambulatory Visit (INDEPENDENT_AMBULATORY_CARE_PROVIDER_SITE_OTHER): Payer: Medicare Other | Admitting: Rehabilitative and Restorative Service Providers"

## 2022-04-25 ENCOUNTER — Encounter: Payer: Self-pay | Admitting: Rehabilitative and Restorative Service Providers"

## 2022-04-25 DIAGNOSIS — M6281 Muscle weakness (generalized): Secondary | ICD-10-CM | POA: Diagnosis not present

## 2022-04-25 DIAGNOSIS — M25531 Pain in right wrist: Secondary | ICD-10-CM

## 2022-04-25 DIAGNOSIS — R278 Other lack of coordination: Secondary | ICD-10-CM | POA: Diagnosis not present

## 2022-04-25 DIAGNOSIS — M25631 Stiffness of right wrist, not elsewhere classified: Secondary | ICD-10-CM | POA: Diagnosis not present

## 2022-04-25 DIAGNOSIS — M25641 Stiffness of right hand, not elsewhere classified: Secondary | ICD-10-CM

## 2022-04-25 NOTE — Therapy (Signed)
OUTPATIENT OCCUPATIONAL THERAPY ORTHO EVALUATION  Patient Name: Brandon [name] MRN: [id number] DOB:06/25/1946, 76 y.o., male Today's Date: 04/25/2022  PCP: Dr. Anjali Gupta REFERRING PROVIDER: Dr. Charlie Benfiled   OT End of Session - 04/25/22 1432     Visit Number 1    Number of Visits 10    Date for OT Re-Evaluation 06/08/22    Authorization Type Medicare & AARP    Progress Note Due on Visit 10    OT Start Time 1432    OT Stop Time 1523    OT Time Calculation (min) 51 min    Equipment Utilized During Treatment pre-fab thumb/wrist brace    Activity Tolerance Patient tolerated treatment well;No increased pain;Patient limited by fatigue;Patient limited by pain    Behavior During Therapy WFL for tasks assessed/performed             Past Medical History:  Diagnosis Date   Arthritis    neck   Cataract    Cervicalgia 03/29/2015   History of colonic polyps    Hyperlipidemia    Hypertension    Thrombocytopenia (HCC)    Vitamin D deficiency    Past Surgical History:  Procedure Laterality Date   ARM SKIN LESION BIOPSY / EXCISION Left 02/2016   atypical lentigmous and nested melanocytic proliferation--Dr. Walter Whitworth   CATARACT EXTRACTION Bilateral january and february 2018   ORIF SCAPHOID FRACTURE Right 01/24/2022   Procedure: RIGHT OPEN REDUCTION INTERNAL FIXATION (ORIF) SCAPHOID FRACTURE;  Surgeon: Benfield, Charlie, MD;  Location: [facility];  Service: Orthopedics;  Laterality: Right;   Patient Active Problem List   Diagnosis Date Noted   Nondisplaced fracture of right scaphoid bone 01/18/2022   Degenerative disc disease, cervical 06/20/2020   Slow urinary stream 05/16/2017   Nocturia more than twice per night 05/16/2017   Thumb tendonitis 04/06/2016   Cervicalgia 03/29/2015   Essential hypertension 12/17/2014   Vitamin D deficiency 12/17/2014   Thrombocytopenia (HCC) 12/17/2014   History of colonic polyps 12/17/2014   Hyperlipidemia 12/17/2014   Counseling  regarding advanced care planning and goals of care 12/17/2014    ONSET DATE: 01/24/22 DOS  REFERRING DIAG: S62.024A (ICD-10-CM) - Closed nondisplaced fracture of middle third of scaphoid bone of right wrist, initial encounter  THERAPY DIAG:  Stiffness of right wrist, not elsewhere classified  Other lack of coordination  Muscle weakness (generalized)  Stiffness of right hand, not elsewhere classified  Pain in right wrist  Rationale for Evaluation and Treatment Rehabilitation  SUBJECTIVE:   SUBJECTIVE STATEMENT: 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 He states that after the doctor took off his cast, he was given a pre-fab thumb spica brace (has in hand today), and he has only worn it infrequently. He states going on a 10 mile bike ride, picking up bicycles at his volunteer job, and basically not holding back activities. He admits that he has had some pain, when shifting gears biking, when doing other tasks at times. He also states some fine motor skill problems and unable to return to all desired activities. He states wanting to come and learn what he "should be doing" after his fracture, sx, and immobilization.    PERTINENT HISTORY: Per MD: "Right scaphoid fx s/p ORIF.  Would like to work on ROM.  Has been casted for two months."  PRECAUTIONS: 13 weeks since sx, and out of cast for ~2 weeks, no heavy, forceful activities recommended until non-tender with fullness   of ROM and for at least 2-3 more weeks, for healing and safety sake.   WEIGHT BEARING RESTRICTIONS Yes not recommended to lift >5# for now, especially without bracing  PAIN:  Are you having pain? Not now at rest Rating: 0/10 at rest now, up to 3-4/10 at worst in past week when riding bike 10 miles  FALLS: Has patient fallen in last 6 months? Yes. Number of falls 1 (this accident playing pickle ball)  LIVING ENVIRONMENT: Lives with: lives with their  family   PLOF: Independent  PATIENT GOALS To get full motion back, have no pain and return to all activities.    OBJECTIVE:   HAND DOMINANCE: Left  ADLs: Overall ADLs: States decreased ability to shift bicycle    FUNCTIONAL OUTCOME MEASURES: Eval: Patient Specific Functional Scale: 5.6 (biking, works tasks (lifting, etc.), self-care workout)  UPPER EXTREMITY ROM     Eval: Rt hand ROM is WNL though thumb is tight to base of small finger with ROM.   Active ROM Right eval Left eval  Wrist flexion 55 62  Wrist extension 55 63  Wrist ulnar deviation 26 45  Wrist radial deviation 9 11  Wrist pronation 85 85  Wrist supination 85 85  (Blank rows = not tested)   UPPER EXTREMITY MMT:     Eval: NT today to allow for more healing  MMT Right eval  Elbow flexion   Elbow extension   Wrist flexion   Wrist extension   Wrist ulnar deviation   Wrist radial deviation   Wrist pronation   Wrist supination   (Blank rows = not tested)  HAND FUNCTION: Eval: Grip strength Right: 67 lbs (non-painful), Left: 91 lbs   COORDINATION: Eval: He states some problems using buttons/fasteners initially, but has been improving.   9HPT TBD PRN  SENSATION: Eval:  Light touch intact today, though diminished around sx area    EDEMA:   Eval:  Mildly swollen in wrist today, 18.9cm circumferentially around prox wrist crease (compared to 18cm left dom wrist crease)   COGNITION: Overall cognitive status: WFL for evaluation today   OBSERVATIONS:   Eval: Some thumb and wrist tightness compared to left dominant side, also mild swelling. Sx area looks excellent.    TODAY'S TREATMENT:  Eval: OT first edu for self-care safety to wear pre-fab brace for any heavy lifting or forceful activities (biking, volunteering, etc.) for at least 2-3 more weeks (or MD determines healing). OT also supplies him with tight soft pre-fab thumb and wrist brace to use as a "step-down" weaning from rigid thumb spica for  lighter tasks. This fits well and supports his ability.  OT also gives initial exercises as below, which he demo's back with no increase in pain, feeling stretches.   Exercises - Seated Wrist Flexion with Overpressure  - 3-4 x daily - 3-5 reps - 15 sec hold - Wrist Prayer Stretch  - 3-4 x daily - 3-5 reps - 15 sec hold - Seated Thumb MP Flexion PROM  - 4-6 x daily - 1 sets - 10-15 reps - Seated Wrist Radial Deviation Stretch  - 3-4 x daily - 3-5 reps - 15 hold   PATIENT EDUCATION: Education details: See tx section above for details  Person educated: Patient Education method: Verbal Instruction, Teach back, Handouts  Education comprehension: States and demonstrates understanding, Additional Education required    HOME EXERCISE PROGRAM: Access Code: [email or web address] URL: [email or web address] Date: 04/25/2022 Prepared by: Benito Mccreedy  GOALS: Goals reviewed with patient? Yes  SHORT TERM GOALS: (STG required if POC>30 days)  Pt will obtain protective, custom orthotic. Target date: 04/25/22 Goal status: MET (soft pre-fab today)   2.  Pt will demo/state understanding of initial HEP to improve pain levels and prerequisite motion. Target date: 05/04/22 Goal status: INITIAL   LONG TERM GOALS:  Pt will improve functional ability by decreased impairment per PSFS assessment from 5.6 to 8 or better, for better quality of life. Target date: 06/08/22 Goal status: INITIAL  2.  Pt will improve grip strength in right hand from 67lbs to at least 75lbs for functional use at home and in IADLs. Target date: 06/08/22 Goal status: INITIAL  3.  Pt will improve A/ROM in right wrist flex, ext from 55* each to at least 60* each, to have functional motion for tasks like reach and grasp.  Target date: 06/08/22 Goal status: INITIAL  4.  Pt will improve strength in right entire arm to 5/5 MMT not painful to have increased functional ability to carry out selfcare health maintenance and  higher-level homecare tasks with no difficulty. Target date: 06/08/22 Goal status: INITIAL   ASSESSMENT:  CLINICAL IMPRESSION: Patient is a 75 y.o. male who was seen today for occupational therapy evaluation for right scaphoid fx ORIF post immobilization and decreased ability now.   PERFORMANCE DEFICITS in functional skills including IADLs, coordination, dexterity, ROM, strength, pain, flexibility, FMC, endurance, decreased knowledge of precautions, and UE functional use, cognitive skills including safety awareness, and psychosocial skills including coping strategies, environmental adaptation, habits, and routines and behaviors.   IMPAIRMENTS are limiting patient from IADLs, work, leisure, and social participation.   COMORBIDITIES may have co-morbidities  that affects occupational performance. Patient will benefit from skilled OT to address above impairments and improve overall function.  MODIFICATION OR ASSISTANCE TO COMPLETE EVALUATION: No modification of tasks or assist necessary to complete an evaluation.  OT OCCUPATIONAL PROFILE AND HISTORY: Problem focused assessment: Including review of records relating to presenting problem.  CLINICAL DECISION MAKING: LOW - limited treatment options, no task modification necessary  REHAB POTENTIAL: Excellent  EVALUATION COMPLEXITY: Low      PLAN: OT FREQUENCY: 1x/week  OT DURATION: 6 weeks (through 06/08/22)  PLANNED INTERVENTIONS: self care/ADL training, therapeutic exercise, therapeutic activity, manual therapy, passive range of motion, splinting, fluidotherapy, moist heat, cryotherapy, patient/family education, and coping strategies training  RECOMMENDED OTHER SERVICES: none now  CONSULTED AND AGREED WITH PLAN OF CARE: Patient  PLAN FOR NEXT SESSION: Check initial HEP and upgrade to hand and wrist strength as tolerated.    Nathanael Moore, OTR/L, CHT 04/25/2022, 6:16 PM   

## 2022-05-02 ENCOUNTER — Ambulatory Visit (INDEPENDENT_AMBULATORY_CARE_PROVIDER_SITE_OTHER): Payer: Medicare Other | Admitting: Rehabilitative and Restorative Service Providers"

## 2022-05-02 ENCOUNTER — Encounter: Payer: Self-pay | Admitting: Rehabilitative and Restorative Service Providers"

## 2022-05-02 DIAGNOSIS — R278 Other lack of coordination: Secondary | ICD-10-CM

## 2022-05-02 DIAGNOSIS — M25631 Stiffness of right wrist, not elsewhere classified: Secondary | ICD-10-CM

## 2022-05-02 DIAGNOSIS — M6281 Muscle weakness (generalized): Secondary | ICD-10-CM | POA: Diagnosis not present

## 2022-05-02 DIAGNOSIS — M25641 Stiffness of right hand, not elsewhere classified: Secondary | ICD-10-CM | POA: Diagnosis not present

## 2022-05-02 DIAGNOSIS — M25531 Pain in right wrist: Secondary | ICD-10-CM | POA: Diagnosis not present

## 2022-05-02 NOTE — Therapy (Signed)
OUTPATIENT OCCUPATIONAL THERAPY TREATMENT NOTE   Patient Name: Brandon [name] MRN: [id number] DOB:1946-03-11, 76 y.o., male Today's Date: 05/02/2022  PCP: Dr. Veleta Miners REFERRING PROVIDER: Dr. Lynda Rainwater  END OF SESSION:   OT End of Session - 05/02/22 1609     Visit Number 2    Number of Visits 10    Date for OT Re-Evaluation 06/08/22    Authorization Type Medicare & AARP    Progress Note Due on Visit 10    OT Start Time 1610    OT Stop Time 1654    OT Time Calculation (min) 44 min    Equipment Utilized During Treatment pre-fab thumb/wrist brace    Activity Tolerance Patient tolerated treatment well;No increased pain;Patient limited by fatigue;Patient limited by pain    Behavior During Therapy [facility] for tasks assessed/performed             Past Medical History:  Diagnosis Date   Arthritis    neck   Cataract    Cervicalgia 03/29/2015   History of colonic polyps    Hyperlipidemia    Hypertension    Thrombocytopenia ([place])    Vitamin D deficiency    Past Surgical History:  Procedure Laterality Date   ARM SKIN LESION BIOPSY / EXCISION Left 02/2016   atypical lentigmous and nested melanocytic proliferation--Dr. Harriett Sine   CATARACT EXTRACTION Bilateral january and february 2018   ORIF SCAPHOID FRACTURE Right 01/24/2022   Procedure: RIGHT OPEN REDUCTION INTERNAL FIXATION (ORIF) SCAPHOID FRACTURE;  Surgeon: Sherilyn Cooter, MD;  Location: [place];  Service: Orthopedics;  Laterality: Right;   Patient Active Problem List   Diagnosis Date Noted   Nondisplaced fracture of right scaphoid bone 01/18/2022   Degenerative disc disease, cervical 06/20/2020   Slow urinary stream 05/16/2017   Nocturia more than twice per night 05/16/2017   Thumb tendonitis 04/06/2016   Cervicalgia 03/29/2015   Essential hypertension 12/17/2014   Vitamin D deficiency 12/17/2014   Thrombocytopenia ([place]) 12/17/2014   History of colonic polyps 12/17/2014   Hyperlipidemia 12/17/2014    Counseling regarding advanced care planning and goals of care 12/17/2014    ONSET DATE: 01/24/22 DOS   REFERRING DIAG: T15.726O (ICD-10-CM) - Closed nondisplaced fracture of middle third of scaphoid bone of right wrist, initial encounter  THERAPY DIAG:  Stiffness of right wrist, not elsewhere classified  Other lack of coordination  Pain in right wrist  Muscle weakness (generalized)  Stiffness of right hand, not elsewhere classified  Rationale for Evaluation and Treatment Rehabilitation  PERTINENT HISTORY: Per MD: "Right scaphoid fx s/p ORIF.  Would like to work on ROM.  Has been casted for two months."   PRECAUTIONS: 13 weeks since sx, and out of cast for ~2 weeks, no heavy, forceful activities recommended until non-tender with fullness of ROM and for at least 2-3 more weeks, for healing and safety sake.    WEIGHT BEARING RESTRICTIONS Yes not recommended to lift >5# for now, especially without bracing  SUBJECTIVE:  He states doing well but not doing HEP as often as asked.   PAIN:  Are you having pain? No Rating: 0/10 at rest now    OBJECTIVE: (All objective assessments below are from initial evaluation on: 04/25/22 unless otherwise specified.)   HAND DOMINANCE: Left   ADLs: Overall ADLs: States decreased ability to shift bicycle      FUNCTIONAL OUTCOME MEASURES: Eval: Patient Specific Functional Scale: 5.6 (biking, works tasks (lifting, etc.), self-care workout)   UPPER EXTREMITY ROM  Eval: Rt hand ROM is WNL though thumb is tight to base of small finger with ROM.    Active ROM Right eval Left eval  Wrist flexion 55 62  Wrist extension 55 63  Wrist ulnar deviation 26 45  Wrist radial deviation 9 11  Wrist pronation 85 85  Wrist supination 85 85  (Blank rows = not tested)     UPPER EXTREMITY MMT:      Eval: NT today to allow for more healing   MMT Right eval  Elbow flexion    Elbow extension    Wrist flexion    Wrist extension    Wrist ulnar  deviation    Wrist radial deviation    Wrist pronation    Wrist supination    (Blank rows = not tested)   HAND FUNCTION: Eval: Grip strength Right: 67 lbs (non-painful), Left: 91 lbs    COORDINATION: Eval: He states some problems using buttons/fasteners initially, but has been improving.   9HPT TBD PRN   SENSATION: Eval:  Light touch intact today, though diminished around sx area     EDEMA:             Eval:  Mildly swollen in wrist today, 18.9cm circumferentially around prox wrist crease (compared to 18cm left dom wrist crease)    COGNITION: Overall cognitive status: WFL for evaluation today    OBSERVATIONS:             Eval: Some thumb and wrist tightness compared to left dominant side, also mild swelling. Sx area looks excellent.      TODAY'S TREATMENT:  05/02/22: OT reviews HEP while he is on [place] for 4 mins (no irritation, feels better), and then edu on HEP upgrade for hand and wrist strength dynamic activities as below. He tolerates well as listed below.   Exercises - Seated Wrist Flexion with Overpressure  - 3-4 x daily - 3-5 reps - 15 sec hold - Wrist Prayer Stretch  - 3-4 x daily - 3-5 reps - 15 sec hold - Seated Thumb MP Flexion PROM  - 4-6 x daily - 1 sets - 10-15 reps - Seated Wrist Radial Deviation Stretch  - 3-4 x daily - 3-5 reps - 15 hold - Wrist AROM Dart Throwers Motion  - 3-4 x daily - 1-2 sets - 10-15 reps - Wrist AROM Dart Throwers Motion with Resistance  - 2-3 x daily - 5-10 reps -Full Fist Grip - Finger Extension "Pizza!"   - 2-3 x daily - 5 reps - Thumb Press  - 2-3 x daily - 5 reps - Thumb Opposition with Putty  - 2-3 x daily - 5 reps   Eval: OT first edu for self-care safety to wear pre-fab brace for any heavy lifting or forceful activities (biking, volunteering, etc.) for at least 2-3 more weeks (or MD determines healing). OT also supplies him with tight soft pre-fab thumb and wrist brace to use as a "step-down" weaning from rigid thumb spica for  lighter tasks. This fits well and supports his ability.  OT also gives initial exercises as below, which he demo's back with no increase in pain, feeling stretches.    Exercises - Seated Wrist Flexion with Overpressure  - 3-4 x daily - 3-5 reps - 15 sec hold - Wrist Prayer Stretch  - 3-4 x daily - 3-5 reps - 15 sec hold - Seated Thumb MP Flexion PROM  - 4-6 x daily - 1 sets - 10-15 reps - Seated  Wrist Radial Deviation Stretch  - 3-4 x daily - 3-5 reps - 15 hold     PATIENT EDUCATION: Education details: See tx section above for details  Person educated: Patient Education method: Verbal Instruction, Teach back, Handouts  Education comprehension: States and demonstrates understanding, Additional Education required      HOME EXERCISE PROGRAM: Access Code: [email or web address] URL: [email or web address] Prepared by: Benito Mccreedy   GOALS: Goals reviewed with patient? Yes     SHORT TERM GOALS: (STG required if POC>30 days)   Pt will obtain protective, custom orthotic. Target date: 04/25/22 Goal status: MET (soft pre-fab today)    2.  Pt will demo/state understanding of initial HEP to improve pain levels and prerequisite motion. Target date: 05/04/22 Goal status: 05/02/22 MET     LONG TERM GOALS:   Pt will improve functional ability by decreased impairment per PSFS assessment from 5.6 to 8 or better, for better quality of life. Target date: 06/08/22 Goal status: INITIAL   2.  Pt will improve grip strength in right hand from 67lbs to at least 75lbs for functional use at home and in IADLs. Target date: 06/08/22 Goal status: INITIAL   3.  Pt will improve A/ROM in right wrist flex, ext from 55* each to at least 60* each, to have functional motion for tasks like reach and grasp.  Target date: 06/08/22 Goal status: INITIAL   4.  Pt will improve strength in right entire arm to 5/5 MMT not painful to have increased functional ability to carry out selfcare health maintenance and  higher-level homecare tasks with no difficulty. Target date: 06/08/22 Goal status: INITIAL     ASSESSMENT:   CLINICAL IMPRESSION: 05/02/22: Tolerating light strength training now, keep progressing while staying cautious of pain/exacerbation. OT will f/u in 2-4 weeks with him to check goals, he feels comfortable with independent HEP for now.   04/25/22: Patient is a 76 y.o. male who was seen today for occupational therapy evaluation for right scaphoid fx ORIF post immobilization and decreased ability now.     PLAN: OT FREQUENCY: 1x/week   OT DURATION: 6 weeks (through 06/08/22)   PLANNED INTERVENTIONS: self care/ADL training, therapeutic exercise, therapeutic activity, manual therapy, passive range of motion, splinting, fluidotherapy, moist heat, cryotherapy, patient/family education, and coping strategies training   RECOMMENDED OTHER SERVICES: none now   CONSULTED AND AGREED WITH PLAN OF CARE: Patient   PLAN FOR NEXT SESSION:  Reassess in 2-4 weeks to check goals   Benito Mccreedy, OTR/L, CHT 05/02/2022, 5:03 PM

## 2022-05-08 ENCOUNTER — Ambulatory Visit (INDEPENDENT_AMBULATORY_CARE_PROVIDER_SITE_OTHER): Payer: Medicare Other

## 2022-05-08 ENCOUNTER — Ambulatory Visit (INDEPENDENT_AMBULATORY_CARE_PROVIDER_SITE_OTHER): Payer: Medicare Other | Admitting: Orthopedic Surgery

## 2022-05-08 DIAGNOSIS — S62024A Nondisplaced fracture of middle third of navicular [scaphoid] bone of right wrist, initial encounter for closed fracture: Secondary | ICD-10-CM

## 2022-05-08 NOTE — Progress Notes (Signed)
Office Visit Note   Patient: Brandon [name]           Date of Birth: Aug 21, 1946           MRN: [id number] Visit Date: 05/08/2022              Requested by: Virgie Dad, MD [address] [place],  [place] [address] PCP: Virgie Dad, MD   Assessment & Plan: Visit Diagnoses:  1. Closed nondisplaced fracture of middle third of scaphoid bone of right wrist, initial encounter     Plan: Patient is now 15 weeks status post ORIF of his right scaphoid fracture.  X-rays today show healing of the fracture.  He has near full range of motion.  He occasionally will feel a dull aching pain at the radial aspect of his wrist with certain activities.  These involve pinch.  Does have pain and crepitus with CMC grind test, so discussed that some of this discomfort is likely from [facility] arthritis.  He is wearing a Comfort Cool brace as needed.  He has 1 more hand therapy session.  Is overall happy with his results.  He can follow-up with me as needed.  Follow-Up Instructions: No follow-ups on file.   Orders:  Orders Placed This Encounter  Procedures   XR Wrist Complete Right   No orders of the defined types were placed in this encounter.     Procedures: No procedures performed   Clinical Data: No additional findings.   Subjective: Chief Complaint  Patient presents with   Right Wrist - Follow-up, Fracture    This is a 76 year old right-hand-dominant male who presents for 15-week follow-up of a right scaphoid waist fracture status post ORIF.  He is doing well today.  He is able to do most of the things that he wants to do.  He recently performed a ropes course and did very well with minimal discomfort.  Most of the pain he feels is at the radial aspect of the wrist and with activities that involve pinch or grasp.  Overall, his discomfort is much less than before surgery.  He has 1 more hand therapy session and has been working on wrist and hand strengthening.  Overall he is happy with his  result so far.    Review of Systems   Objective: Vital Signs: There were no vitals taken for this visit.  Physical Exam  Right Hand Exam   Tenderness  The patient is experiencing no tenderness.   Other  Erythema: absent Sensation: normal Pulse: present  Comments:  Pain and crepitus with CMC grind test.  No pain with range of motion of the wrist.  Wrist range of motion near symmetric to contralateral side.      Specialty Comments:  No specialty comments available.  Imaging: No results found.   PMFS History: Patient Active Problem List   Diagnosis Date Noted   Nondisplaced fracture of right scaphoid bone 01/18/2022   Degenerative disc disease, cervical 06/20/2020   Slow urinary stream 05/16/2017   Nocturia more than twice per night 05/16/2017   Thumb tendonitis 04/06/2016   Cervicalgia 03/29/2015   Essential hypertension 12/17/2014   Vitamin D deficiency 12/17/2014   Thrombocytopenia ([place]) 12/17/2014   History of colonic polyps 12/17/2014   Hyperlipidemia 12/17/2014   Counseling regarding advanced care planning and goals of care 12/17/2014   Past Medical History:  Diagnosis Date   Arthritis    neck   Cataract    Cervicalgia 03/29/2015  History of colonic polyps    Hyperlipidemia    Hypertension    Thrombocytopenia (HCC)    Vitamin D deficiency     Family History  Problem Relation Age of Onset   Clotting disorder Father    Suicidality Father    High Cholesterol Mother    Renal Disease Mother    Stroke Mother 8   Colon cancer Neg Hx    Esophageal cancer Neg Hx    Stomach cancer Neg Hx    Rectal cancer Neg Hx     Past Surgical History:  Procedure Laterality Date   ARM SKIN LESION BIOPSY / EXCISION Left 02/2016   atypical lentigmous and nested melanocytic proliferation--Dr. Harriett Sine   CATARACT EXTRACTION Bilateral january and february 2018   ORIF SCAPHOID FRACTURE Right 01/24/2022   Procedure: RIGHT OPEN REDUCTION INTERNAL FIXATION  (ORIF) SCAPHOID FRACTURE;  Surgeon: Sherilyn Cooter, MD;  Location: [place];  Service: Orthopedics;  Laterality: Right;   Social History   Occupational History   Not on file  Tobacco Use   Smoking status: Never   Smokeless tobacco: Never  Vaping Use   Vaping Use: Never used  Substance and Sexual Activity   Alcohol use: Not Currently    Comment: ocassional   Drug use: No   Sexual activity: Not Currently

## 2022-05-28 DIAGNOSIS — H401411 Capsular glaucoma with pseudoexfoliation of lens, right eye, mild stage: Secondary | ICD-10-CM | POA: Diagnosis not present

## 2022-05-28 DIAGNOSIS — Z961 Presence of intraocular lens: Secondary | ICD-10-CM | POA: Diagnosis not present

## 2022-05-30 ENCOUNTER — Other Ambulatory Visit: Payer: Self-pay

## 2022-05-30 DIAGNOSIS — L814 Other melanin hyperpigmentation: Secondary | ICD-10-CM | POA: Diagnosis not present

## 2022-05-30 DIAGNOSIS — L821 Other seborrheic keratosis: Secondary | ICD-10-CM | POA: Diagnosis not present

## 2022-05-30 DIAGNOSIS — Z85828 Personal history of other malignant neoplasm of skin: Secondary | ICD-10-CM | POA: Diagnosis not present

## 2022-05-30 DIAGNOSIS — C44719 Basal cell carcinoma of skin of left lower limb, including hip: Secondary | ICD-10-CM | POA: Diagnosis not present

## 2022-05-30 DIAGNOSIS — Z85820 Personal history of malignant melanoma of skin: Secondary | ICD-10-CM | POA: Diagnosis not present

## 2022-05-30 DIAGNOSIS — E782 Mixed hyperlipidemia: Secondary | ICD-10-CM

## 2022-05-30 DIAGNOSIS — I10 Essential (primary) hypertension: Secondary | ICD-10-CM

## 2022-05-30 DIAGNOSIS — L570 Actinic keratosis: Secondary | ICD-10-CM | POA: Diagnosis not present

## 2022-05-30 DIAGNOSIS — L720 Epidermal cyst: Secondary | ICD-10-CM | POA: Diagnosis not present

## 2022-05-31 ENCOUNTER — Other Ambulatory Visit: Payer: Self-pay | Admitting: Internal Medicine

## 2022-05-31 DIAGNOSIS — I10 Essential (primary) hypertension: Secondary | ICD-10-CM | POA: Diagnosis not present

## 2022-05-31 DIAGNOSIS — E782 Mixed hyperlipidemia: Secondary | ICD-10-CM | POA: Diagnosis not present

## 2022-05-31 LAB — CBC WITH DIFFERENTIAL/PLATELET
Absolute Monocytes: 408 cells/uL (ref 200–950)
Basophils Absolute: 31 cells/uL (ref 0–200)
Basophils Relative: 0.6 %
Eosinophils Absolute: 102 cells/uL (ref 15–500)
Eosinophils Relative: 2 %
HCT: 45.8 % (ref 38.5–50.0)
Hemoglobin: 15.8 g/dL (ref 13.2–17.1)
Lymphs Abs: 1117 cells/uL (ref 850–3900)
MCH: 31.3 pg (ref 27.0–33.0)
MCHC: 34.5 g/dL (ref 32.0–36.0)
MCV: 90.7 fL (ref 80.0–100.0)
MPV: 11.7 fL (ref 7.5–12.5)
Monocytes Relative: 8 %
Neutro Abs: 3443 cells/uL (ref 1500–7800)
Neutrophils Relative %: 67.5 %
Platelets: 142 10*3/uL (ref 140–400)
RBC: 5.05 10*6/uL (ref 4.20–5.80)
RDW: 12.4 % (ref 11.0–15.0)
Total Lymphocyte: 21.9 %
WBC: 5.1 10*3/uL (ref 3.8–10.8)

## 2022-05-31 LAB — COMPLETE METABOLIC PANEL WITH GFR
AG Ratio: 2.4 (calc) (ref 1.0–2.5)
ALT: 22 U/L (ref 9–46)
AST: 20 U/L (ref 10–35)
Albumin: 4.5 g/dL (ref 3.6–5.1)
Alkaline phosphatase (APISO): 41 U/L (ref 35–144)
BUN: 20 mg/dL (ref 7–25)
CO2: 30 mmol/L (ref 20–32)
Calcium: 9.5 mg/dL (ref 8.6–10.3)
Chloride: 104 mmol/L (ref 98–110)
Creat: 1.18 mg/dL (ref 0.70–1.28)
Globulin: 1.9 g/dL (calc) (ref 1.9–3.7)
Glucose, Bld: 73 mg/dL (ref 65–99)
Potassium: 3.7 mmol/L (ref 3.5–5.3)
Sodium: 141 mmol/L (ref 135–146)
Total Bilirubin: 0.7 mg/dL (ref 0.2–1.2)
Total Protein: 6.4 g/dL (ref 6.1–8.1)
eGFR: 64 mL/min/{1.73_m2} (ref >=60)

## 2022-05-31 LAB — LIPID PANEL
Cholesterol: 152 mg/dL (ref <200)
HDL: 51 mg/dL (ref >=40)
LDL Cholesterol (Calc): 85 mg/dL (calc)
Non-HDL Cholesterol (Calc): 101 mg/dL (calc) (ref <130)
Total CHOL/HDL Ratio: 3 (calc) (ref <5.0)
Triglycerides: 75 mg/dL (ref <150)

## 2022-06-06 ENCOUNTER — Encounter: Payer: Self-pay | Admitting: Internal Medicine

## 2022-06-06 ENCOUNTER — Encounter: Payer: Self-pay | Admitting: Rehabilitative and Restorative Service Providers"

## 2022-06-06 ENCOUNTER — Ambulatory Visit (INDEPENDENT_AMBULATORY_CARE_PROVIDER_SITE_OTHER): Payer: Medicare Other | Admitting: Rehabilitative and Restorative Service Providers"

## 2022-06-06 ENCOUNTER — Non-Acute Institutional Stay: Payer: Medicare Other | Admitting: Internal Medicine

## 2022-06-06 VITALS — BP 129/84 | HR 60 | Temp 97.5°F | Ht 73.0 in | Wt 192.9 lb

## 2022-06-06 DIAGNOSIS — M25531 Pain in right wrist: Secondary | ICD-10-CM

## 2022-06-06 DIAGNOSIS — M25631 Stiffness of right wrist, not elsewhere classified: Secondary | ICD-10-CM

## 2022-06-06 DIAGNOSIS — M5030 Other cervical disc degeneration, unspecified cervical region: Secondary | ICD-10-CM | POA: Diagnosis not present

## 2022-06-06 DIAGNOSIS — R278 Other lack of coordination: Secondary | ICD-10-CM

## 2022-06-06 DIAGNOSIS — Z7189 Other specified counseling: Secondary | ICD-10-CM | POA: Diagnosis not present

## 2022-06-06 DIAGNOSIS — M542 Cervicalgia: Secondary | ICD-10-CM | POA: Diagnosis not present

## 2022-06-06 DIAGNOSIS — M6281 Muscle weakness (generalized): Secondary | ICD-10-CM

## 2022-06-06 DIAGNOSIS — M25641 Stiffness of right hand, not elsewhere classified: Secondary | ICD-10-CM

## 2022-06-06 NOTE — Therapy (Signed)
OUTPATIENT OCCUPATIONAL THERAPY TREATMENT & DISCHARGE NOTE   Patient Name: [name] MRN: [id number] DOB:1946-03-05, 76 y.o., male Today's Date: 06/06/2022  PCP: Dr. Veleta Miners REFERRING PROVIDER: Dr. Lynda Rainwater  END OF SESSION:   OT End of Session - 06/06/22 0937     Visit Number 3    Number of Visits 10    Date for OT Re-Evaluation 06/08/22    Authorization Type Medicare & AARP    Progress Note Due on Visit 10    OT Start Time [phone]    OT Stop Time 1020    OT Time Calculation (min) 43 min    Equipment Utilized During Treatment --    Activity Tolerance Patient tolerated treatment well;No increased pain    Behavior During Therapy [facility] for tasks assessed/performed             Past Medical History:  Diagnosis Date   Arthritis    neck   Cataract    Cervicalgia 03/29/2015   History of colonic polyps    Hyperlipidemia    Hypertension    Thrombocytopenia (HCC)    Vitamin D deficiency    Past Surgical History:  Procedure Laterality Date   ARM SKIN LESION BIOPSY / EXCISION Left 02/2016   atypical lentigmous and nested melanocytic proliferation--Dr. Harriett Sine   CATARACT EXTRACTION Bilateral january and february 2018   ORIF SCAPHOID FRACTURE Right 01/24/2022   Procedure: RIGHT OPEN REDUCTION INTERNAL FIXATION (ORIF) SCAPHOID FRACTURE;  Surgeon: Sherilyn Cooter, MD;  Location: [place];  Service: Orthopedics;  Laterality: Right;   Patient Active Problem List   Diagnosis Date Noted   Nondisplaced fracture of right scaphoid bone 01/18/2022   Degenerative disc disease, cervical 06/20/2020   Slow urinary stream 05/16/2017   Nocturia more than twice per night 05/16/2017   Thumb tendonitis 04/06/2016   Cervicalgia 03/29/2015   Essential hypertension 12/17/2014   Vitamin D deficiency 12/17/2014   Thrombocytopenia ([place]) 12/17/2014   History of colonic polyps 12/17/2014   Hyperlipidemia 12/17/2014   Counseling regarding advanced care planning and goals of care  12/17/2014    ONSET DATE: 01/24/22 DOS   REFERRING DIAG: L45.625W (ICD-10-CM) - Closed nondisplaced fracture of middle third of scaphoid bone of right wrist, initial encounter  THERAPY DIAG:  Stiffness of right wrist, not elsewhere classified  Other lack of coordination  Pain in right wrist  Muscle weakness (generalized)  Stiffness of right hand, not elsewhere classified  Rationale for Evaluation and Treatment Rehabilitation  PERTINENT HISTORY: Per MD: "Right scaphoid fx s/p ORIF.  Would like to work on ROM.  Has been casted for two months."   PRECAUTIONS: ~16 weeks since sx, and out of cast for ~2 weeks, no heavy, forceful activities recommended until non-tender with fullness of ROM and for at least 2-3 more weeks, for healing and safety sake.    WEIGHT BEARING RESTRICTIONS Yes not recommended to lift >5# for now, especially without bracing  SUBJECTIVE:  He states doing very well but also not being consistent with HEP over the past few weeks. He states no pain now but feeling slightly weak with heavier tasks still.   PAIN:  Are you having pain? No  Rating: 0/10 at rest now    OBJECTIVE: (All objective assessments below are from initial evaluation on: 04/25/22 unless otherwise specified.)   HAND DOMINANCE: Left   ADLs: Overall ADLs: States no memorable problems other than opening tight jars or heavy lifting      FUNCTIONAL OUTCOME MEASURES: 06/06/22: PSFS:  9.5  Eval: Patient Specific Functional Scale: 5.6 (biking, works tasks (lifting, etc.), self-care workout)   UPPER EXTREMITY ROM      Eval: Rt hand ROM is WNL though thumb is tight to base of small finger with ROM.    Active ROM Right eval Left eval Right 06/06/22  Wrist flexion 55 62 61  Wrist extension 55 63 64  Wrist ulnar deviation 26 45 41  Wrist radial deviation _0 Wrist pronation 85 85   Wrist supination 85 85   (Blank rows = not tested)     UPPER EXTREMITY MMT:      MMT Right 06/06/22 Left   06/06/22  Elbow flexion     Elbow extension     Wrist flexion  5/5 5/5  Wrist extension  5/5   5/5  Wrist ulnar deviation     Wrist radial deviation  5/5 5/5  Wrist pronation     Wrist supination     (Blank rows = not tested)   HAND FUNCTION: 06/06/22: Left 101#; Right: 88#   Eval: Grip strength Right: 67 lbs (non-painful), Left: 91 lbs    COORDINATION: 06/06/22: no issues now  Eval: He states some problems using buttons/fasteners initially, but has been improving.   9HPT TBD PRN   SENSATION: 06/06/22: no issues   EDEMA:         06/06/22: none   OBSERVATIONS:         06/06/22: no instability or TTP, mild loss of strength compared to opposing side, full fist, etc.       Eval: Some thumb and wrist tightness compared to left dominant side, also mild swelling. Sx area looks excellent.      TODAY'S TREATMENT:  06/06/22: Pt performs AROM, gripping, and strength with right hand/wrist against resistance for exercise/activities as well as new measures today. Using that data, OT also reviews home exercises and provides suggestions for HEP upgrades to increase grip strength, however the pt states he has not been compliant and probably won't need to do anymore stretch/strength, specifically. He does ask about past hx of neck OA and pain and OT refers him to his PCP and PT for treatment ahead of attempting a month-long biking trip.   OT also discusses full return to home and functional tasks with the pt and reviews goals. Pt states understanding and aggress to D/C OP OT today, successfully.      PATIENT EDUCATION: Education details: See tx section above for details  Person educated: Patient Education method: Verbal Instruction, Teach back, Handouts  Education comprehension: States and demonstrates understanding, Additional Education required      HOME EXERCISE PROGRAM: Access Code: [email or web address] URL: [email or web address] Prepared by: Benito Mccreedy   GOALS: Goals reviewed with  patient? Yes     SHORT TERM GOALS: (STG required if POC>30 days)   Pt will obtain protective, custom orthotic. Target date: 04/25/22 Goal status: MET (soft pre-fab today)    2.  Pt will demo/state understanding of initial HEP to improve pain levels and prerequisite motion. Target date: 05/04/22 Goal status: 05/02/22 MET     LONG TERM GOALS:   Pt will improve functional ability by decreased impairment per PSFS assessment from 5.6 to 8 or better, for better quality of life. Target date: 06/08/22 Goal status: 06/06/22: MET 9.5   2.  Pt will improve grip strength in right hand from 67lbs to at least 75lbs for functional use at home and in IADLs. Target date: 06/08/22 Goal status:  06/06/22: MET    3.  Pt will improve A/ROM in right wrist flex, ext from 55* each to at least 60* each, to have functional motion for tasks like reach and grasp.  Target date: 06/08/22 Goal status: 06/06/22: MET    4.  Pt will improve strength in right entire arm to 5/5 MMT not painful to have increased functional ability to carry out selfcare health maintenance and higher-level homecare tasks with no difficulty. Target date: 06/08/22 Goal status: 06/06/22: MET      ASSESSMENT:   CLINICAL IMPRESSION: 06/06/22: He has met all goals and motion and strength are WNL and equal to other side (except for grip strength, which he was advised to continue as desired). He will discharge from OP OT successfully now.    PLAN: OT FREQUENCY:  d/c   OT DURATION: d/c   PLANNED INTERVENTIONS: self care/ADL training, therapeutic exercise, therapeutic activity, manual therapy, passive range of motion, splinting, fluidotherapy, moist heat, cryotherapy, patient/family education, and coping strategies training   RECOMMENDED OTHER SERVICES: none now   CONSULTED AND AGREED WITH PLAN OF CARE: Patient   PLAN FOR NEXT SESSION:  D/C now    Benito Mccreedy, OTR/L, CHT 06/06/2022, 11:36 AM    OCCUPATIONAL THERAPY DISCHARGE  SUMMARY  Visits from Start of Care: 3  Current functional level related to goals / functional outcomes: Pt has met all goals to satisfactory levels and is pleased with outcomes.   Remaining deficits: Pt has no more significant functional deficits or pain.   Education / Equipment: Pt has all needed materials and education. Pt understands how to continue on with self-management. See tx notes for more details.   Patient agrees to discharge due to max benefits received from outpatient occupational therapy / hand therapy at this time.   Benito Mccreedy, OTR/L, CHT 06/06/22

## 2022-06-07 DIAGNOSIS — Z85820 Personal history of malignant melanoma of skin: Secondary | ICD-10-CM | POA: Diagnosis not present

## 2022-06-07 DIAGNOSIS — C44719 Basal cell carcinoma of skin of left lower limb, including hip: Secondary | ICD-10-CM | POA: Diagnosis not present

## 2022-06-07 DIAGNOSIS — Z85828 Personal history of other malignant neoplasm of skin: Secondary | ICD-10-CM | POA: Diagnosis not present

## 2022-06-11 NOTE — Progress Notes (Addendum)
Location:  [facility] (12)  Provider:   Code Status: DNR Goals of Care:     06/06/2022    1:57 PM  Advanced Directives  Does Patient Have a Medical Advance Directive? Yes  Does patient want to make changes to medical advance directive? Yes (Inpatient - patient requests chaplain consult to change a medical advance directive)     Chief Complaint  Patient presents with   Medication Management    Patient returns to the clinic for his 6 month follow up with labs.    Quality Metric Gaps    Flu shot    HPI: Patient is a 76 y.o. male seen today for medical management of chronic diseases.    S/p status post ORIF of his right scaphoid fracture Full Recovery now No pain or discomfort He is now planning to Bike to [place] which will take him almost 1 week of riding Bike He does that Solo. He wanted to talk about his goals of care and wants to be DNR.  He also have a MOST form. He wanted know is there any way if he  fall from his bike nobody perform CPR on him.  He also is having some neck pain and Dr. Tempie Donning told him that he would benefit with therapy especially before  his bike trip He has had x-rays done in the past which have shown degenerative arthritis.  Past Medical History:  Diagnosis Date   Arthritis    neck   Cataract    Cervicalgia 03/29/2015   History of colonic polyps    Hyperlipidemia    Hypertension    Thrombocytopenia (HCC)    Vitamin D deficiency     Past Surgical History:  Procedure Laterality Date   ARM SKIN LESION BIOPSY / EXCISION Left 02/2016   atypical lentigmous and nested melanocytic proliferation--Dr. Harriett Sine   CATARACT EXTRACTION Bilateral january and february 2018   ORIF SCAPHOID FRACTURE Right 01/24/2022   Procedure: RIGHT OPEN REDUCTION INTERNAL FIXATION (ORIF) SCAPHOID FRACTURE;  Surgeon: Sherilyn Cooter, MD;  Location: [place];  Service: Orthopedics;  Laterality: Right;    Allergies   Allergen Reactions   Zocor [Simvastatin] Other (See Comments)    Memory loss   Niaspan [Niacin Er] Hives    Outpatient Encounter Medications as of 06/06/2022  Medication Sig   aspirin EC 81 MG tablet Take 81 mg by mouth daily.   azithromycin (ZITHROMAX) 250 MG tablet Take 2 tablets (500 mg ) by mouth x 1 dose then one tablet ( 250 mg ) by mouth daily x 4 days   Cholecalciferol 50 MCG (2000 UT) TABS Take 2,000 Units by mouth daily.   fenofibrate 160 MG tablet Take 1 tablet (160 mg total) by mouth daily.   hydrochlorothiazide (HYDRODIURIL) 25 MG tablet Take 1 tablet (25 mg total) by mouth daily.   lisinopril (ZESTRIL) 40 MG tablet Take 1 tablet (40 mg total) by mouth daily.   No facility-administered encounter medications on file as of 06/06/2022.    Review of Systems:  Review of Systems  Constitutional:  Negative for activity change, appetite change and unexpected weight change.  HENT: Negative.    Respiratory:  Negative for cough and shortness of breath.   Cardiovascular:  Negative for leg swelling.  Gastrointestinal:  Negative for constipation.  Genitourinary:  Negative for frequency.  Musculoskeletal:  Positive for neck pain. Negative for arthralgias, gait problem and myalgias.  Skin: Negative.  Negative for  rash.  Neurological:  Negative for dizziness and weakness.  Psychiatric/Behavioral:  Negative for confusion and sleep disturbance.   All other systems reviewed and are negative.   Health Maintenance  Topic Date Due   INFLUENZA VACCINE  05/29/2022   COLONOSCOPY (Pts 45-30yr Insurance coverage will need to be confirmed)  06/24/2024   TETANUS/TDAP  05/27/2028   Pneumonia Vaccine 75 Years old  Completed   COVID-19 Vaccine  Completed   Hepatitis C Screening  Completed   Zoster Vaccines- Shingrix  Completed   HPV VACCINES  Aged Out    Physical Exam: Vitals:   06/06/22 1352  BP: 129/84  Pulse: 60  Temp: (!) 97.5 F (36.4 C)  SpO2: 97%  Weight: 192 lb 14.4 oz (87.5  kg)  Height: '6\' 1"'$  (1.854 m)   Body mass index is 25.45 kg/m. Physical Exam Vitals reviewed.  Constitutional:      Appearance: Normal appearance.  HENT:     Head: Normocephalic.     Nose: Nose normal.     Mouth/Throat:     Mouth: Mucous membranes are moist.     Pharynx: Oropharynx is clear.  Eyes:     Pupils: Pupils are equal, round, and reactive to light.  Cardiovascular:     Rate and Rhythm: Normal rate and regular rhythm.     Pulses: Normal pulses.     Heart sounds: No murmur heard. Pulmonary:     Effort: Pulmonary effort is normal. No respiratory distress.     Breath sounds: Normal breath sounds. No rales.  Abdominal:     General: Abdomen is flat. Bowel sounds are normal.     Palpations: Abdomen is soft.  Musculoskeletal:        General: No swelling.     Cervical back: Neck supple.  Skin:    General: Skin is warm.  Neurological:     General: No focal deficit present.     Mental Status: He is alert and oriented to person, place, and time.  Psychiatric:        Mood and Affect: Mood normal.        Thought Content: Thought content normal.     Labs reviewed: Basic Metabolic Panel: Recent Labs    12/04/21 0800 01/24/22 0554 05/31/22 0835  NA 141 141 141  K 3.7 3.6 3.7  CL 104 111 104  CO2 '29 23 30  '$ GLUCOSE 72 100* 73  BUN '20 22 20  '$ CREATININE 1.06 1.09 1.18  CALCIUM 9.3 9.1 9.5  TSH 2.07  --   --    Liver Function Tests: Recent Labs    12/04/21 0800 05/31/22 0835  AST 21 20  ALT 21 22  BILITOT 0.7 0.7  PROT 6.2 6.4   No results for input(s): "LIPASE", "AMYLASE" in the last 8760 hours. No results for input(s): "AMMONIA" in the last 8760 hours. CBC: Recent Labs    12/04/21 0800 01/24/22 0554 05/31/22 0835  WBC 5.6 4.7 5.1  NEUTROABS 3,830  --  3,443  HGB 15.3 14.6 15.8  HCT 44.5 42.8 45.8  MCV 87.4 88.8 90.7  PLT 159 127* 142   Lipid Panel: Recent Labs    12/04/21 0800 05/31/22 0835  CHOL 137 152  HDL 44 51  LDLCALC 76 85  TRIG 83  75  CHOLHDL 3.1 3.0   No results found for: "HGBA1C"  Procedures since last visit: No results found.  Assessment/Plan 1. Neck pain We discussed about not repeating imaging right now as he already  has h/o DJD Therapy referral made  2. Cervicalgia Therapy referal  3. Degenerative disc disease, cervical  4 Goals of care Discussed patient is a DNR He will keep the DNR form  in his pocket when he is biking as he wants to make sure that nobody performed CPR on him if he falls from the bike. At this time we discussed the MOST form and he will be  full interventions including going to the hospital and ventilation if needed patient agreed with the plan.  Unable to discuss more issues today  Labs were discussed Will arrange for follow up in 6 weeks before his Bike ride  Labs/tests ordered:  * No order type specified * Next appt:  08/08/2022

## 2022-07-04 ENCOUNTER — Other Ambulatory Visit: Payer: Self-pay | Admitting: Internal Medicine

## 2022-08-06 ENCOUNTER — Encounter: Payer: Medicare Other | Admitting: Orthopedic Surgery

## 2022-08-08 ENCOUNTER — Non-Acute Institutional Stay: Payer: Medicare Other | Admitting: Internal Medicine

## 2022-08-08 ENCOUNTER — Encounter: Payer: Self-pay | Admitting: Internal Medicine

## 2022-08-08 VITALS — BP 138/86 | HR 63 | Temp 97.5°F | Ht 73.0 in | Wt 193.6 lb

## 2022-08-08 DIAGNOSIS — E782 Mixed hyperlipidemia: Secondary | ICD-10-CM | POA: Diagnosis not present

## 2022-08-08 DIAGNOSIS — I10 Essential (primary) hypertension: Secondary | ICD-10-CM

## 2022-08-08 DIAGNOSIS — B349 Viral infection, unspecified: Secondary | ICD-10-CM

## 2022-08-08 DIAGNOSIS — M542 Cervicalgia: Secondary | ICD-10-CM | POA: Diagnosis not present

## 2022-08-08 NOTE — Patient Instructions (Signed)
Call me if you have Productive cough, Fever or Shortness of breadth

## 2022-08-08 NOTE — Progress Notes (Signed)
[facility]: [name] (12)  Provider:   Code Status: DNR Goals of Care:     08/08/2022    2:57 PM  Advanced Directives  Does Patient Have a Medical Advance Directive? Yes  Type of Paramedic of [place];Out of facility DNR (pink MOST or yellow form);Living will  Does patient want to make changes to medical advance directive? No - Patient declined  Copy of [place] in Chart? No - copy requested     Chief Complaint  Patient presents with   Medical Management of Chronic Issues    2 Month Follow up. Complains of Cough and Congestion. Negative Covid test.    Quality Metric Gaps    To discuss need for Flu or postpone if patient refuses.     HPI: Patient is a 76 y.o. male seen today for an acute visit for Cough and Congestion Hoarse voice  Patient with h/o HTN and HLD came with Acute complain of Cough and Hoarse voice and congestion No Fever No chest pain  No SOB Covid Negative at home Was exposed to his Grandkids this weekend     Past Medical History:  Diagnosis Date   Arthritis    neck   Cataract    Cervicalgia 03/29/2015   History of colonic polyps    Hyperlipidemia    Hypertension    Thrombocytopenia ([place])    Vitamin D deficiency     Past Surgical History:  Procedure Laterality Date   ARM SKIN LESION BIOPSY / EXCISION Left 02/2016   atypical lentigmous and nested melanocytic proliferation--Dr. Harriett Sine   CATARACT EXTRACTION Bilateral january and february 2018   ORIF SCAPHOID FRACTURE Right 01/24/2022   Procedure: RIGHT OPEN REDUCTION INTERNAL FIXATION (ORIF) SCAPHOID FRACTURE;  Surgeon: Sherilyn Cooter, MD;  Location: [place];  Service: Orthopedics;  Laterality: Right;    Allergies  Allergen Reactions   Zocor [Simvastatin] Other (See Comments)    Memory loss   Niaspan [Niacin Er] Hives    Outpatient Encounter Medications as of 08/08/2022  Medication Sig   aspirin EC  81 MG tablet Take 81 mg by mouth daily.   azithromycin (ZITHROMAX) 250 MG tablet Take 2 tablets (500 mg ) by mouth x 1 dose then one tablet ( 250 mg ) by mouth daily x 4 days   Cholecalciferol 50 MCG (2000 UT) TABS Take 2,000 Units by mouth daily.   fenofibrate 160 MG tablet TAKE 1 TABLET BY MOUTH  DAILY   hydrochlorothiazide (HYDRODIURIL) 25 MG tablet TAKE 1 TABLET BY MOUTH  DAILY   lisinopril (ZESTRIL) 40 MG tablet TAKE 1 TABLET BY MOUTH  DAILY   No facility-administered encounter medications on file as of 08/08/2022.    Review of Systems:  Review of Systems  Constitutional:  Negative for activity change, appetite change and unexpected weight change.  HENT:  Positive for sore throat and voice change.   Respiratory:  Positive for cough. Negative for shortness of breath.   Cardiovascular:  Negative for leg swelling.  Gastrointestinal:  Negative for constipation.  Genitourinary:  Negative for frequency.  Musculoskeletal:  Negative for arthralgias, gait problem and myalgias.  Skin: Negative.  Negative for rash.  Neurological:  Negative for dizziness and weakness.  Psychiatric/Behavioral:  Negative for confusion and sleep disturbance.   All other systems reviewed and are negative.   Health Maintenance  Topic Date Due   INFLUENZA VACCINE  05/29/2022   COLONOSCOPY (Pts 45-48yr  Insurance coverage will need to be confirmed)  06/24/2024   TETANUS/TDAP  05/27/2028   Pneumonia Vaccine 82+ Years old  Completed   COVID-19 Vaccine  Completed   Hepatitis C Screening  Completed   Zoster Vaccines- Shingrix  Completed   HPV VACCINES  Aged Out    Physical Exam: Vitals:   08/08/22 1500  BP: 138/86  Pulse: 63  Temp: (!) 97.5 F (36.4 C)  SpO2: 95%  Weight: 193 lb 9.6 oz (87.8 kg)  Height: '6\' 1"'$  (1.854 m)   Body mass index is 25.54 kg/m. Physical Exam Vitals reviewed.  Constitutional:      Appearance: Normal appearance.  HENT:     Head: Normocephalic.     Nose: Nose normal.      Mouth/Throat:     Mouth: Mucous membranes are moist.     Pharynx: Oropharynx is clear. Posterior oropharyngeal erythema present.  Eyes:     Pupils: Pupils are equal, round, and reactive to light.  Cardiovascular:     Rate and Rhythm: Normal rate and regular rhythm.     Pulses: Normal pulses.     Heart sounds: No murmur heard. Pulmonary:     Effort: Pulmonary effort is normal. No respiratory distress.     Breath sounds: Normal breath sounds. No rales.  Abdominal:     General: Abdomen is flat. Bowel sounds are normal.     Palpations: Abdomen is soft.  Musculoskeletal:        General: No swelling.     Cervical back: Neck supple.  Skin:    General: Skin is warm.  Neurological:     General: No focal deficit present.     Mental Status: He is alert and oriented to person, place, and time.  Psychiatric:        Mood and Affect: Mood normal.        Thought Content: Thought content normal.     Labs reviewed: Basic Metabolic Panel: Recent Labs    12/04/21 0800 01/24/22 0554 05/31/22 0835  NA 141 141 141  K 3.7 3.6 3.7  CL 104 111 104  CO2 '29 23 30  '$ GLUCOSE 72 100* 73  BUN '20 22 20  '$ CREATININE 1.06 1.09 1.18  CALCIUM 9.3 9.1 9.5  TSH 2.07  --   --    Liver Function Tests: Recent Labs    12/04/21 0800 05/31/22 0835  AST 21 20  ALT 21 22  BILITOT 0.7 0.7  PROT 6.2 6.4   No results for input(s): "LIPASE", "AMYLASE" in the last 8760 hours. No results for input(s): "AMMONIA" in the last 8760 hours. CBC: Recent Labs    12/04/21 0800 01/24/22 0554 05/31/22 0835  WBC 5.6 4.7 5.1  NEUTROABS 3,830  --  3,443  HGB 15.3 14.6 15.8  HCT 44.5 42.8 45.8  MCV 87.4 88.8 90.7  PLT 159 127* 142   Lipid Panel: Recent Labs    12/04/21 0800 05/31/22 0835  CHOL 137 152  HDL 44 51  LDLCALC 76 85  TRIG 83 75  CHOLHDL 3.1 3.0   No results found for: "HGBA1C"  Procedures since last visit: No results found.  Assessment/Plan 1. Viral illness Covid Negative Tylenol PRN  Mucinex for cough Rest and Fluids  2. Cervicalgia Does not want to do therapy right now  3. Essential hypertension On Lisinopril and HCTZ  4. Mixed hyperlipidemia On Fenofibrate     Labs/tests ordered:  * No order type specified * Next appt:  12/03/2022

## 2022-09-04 DIAGNOSIS — Z23 Encounter for immunization: Secondary | ICD-10-CM | POA: Diagnosis not present

## 2022-11-30 DIAGNOSIS — H401411 Capsular glaucoma with pseudoexfoliation of lens, right eye, mild stage: Secondary | ICD-10-CM | POA: Diagnosis not present

## 2022-12-03 ENCOUNTER — Encounter: Payer: Medicare Other | Admitting: Family

## 2022-12-05 DIAGNOSIS — L570 Actinic keratosis: Secondary | ICD-10-CM | POA: Diagnosis not present

## 2022-12-05 DIAGNOSIS — L821 Other seborrheic keratosis: Secondary | ICD-10-CM | POA: Diagnosis not present

## 2022-12-05 DIAGNOSIS — D4989 Neoplasm of unspecified behavior of other specified sites: Secondary | ICD-10-CM | POA: Diagnosis not present

## 2022-12-05 DIAGNOSIS — Z85820 Personal history of malignant melanoma of skin: Secondary | ICD-10-CM | POA: Diagnosis not present

## 2022-12-05 DIAGNOSIS — D485 Neoplasm of uncertain behavior of skin: Secondary | ICD-10-CM | POA: Diagnosis not present

## 2022-12-05 DIAGNOSIS — L814 Other melanin hyperpigmentation: Secondary | ICD-10-CM | POA: Diagnosis not present

## 2022-12-05 DIAGNOSIS — D2371 Other benign neoplasm of skin of right lower limb, including hip: Secondary | ICD-10-CM | POA: Diagnosis not present

## 2022-12-05 DIAGNOSIS — D1801 Hemangioma of skin and subcutaneous tissue: Secondary | ICD-10-CM | POA: Diagnosis not present

## 2022-12-05 DIAGNOSIS — Z85828 Personal history of other malignant neoplasm of skin: Secondary | ICD-10-CM | POA: Diagnosis not present

## 2022-12-10 ENCOUNTER — Encounter: Payer: Medicare Other | Admitting: Orthopedic Surgery

## 2022-12-15 ENCOUNTER — Encounter: Payer: Self-pay | Admitting: Internal Medicine

## 2022-12-17 ENCOUNTER — Encounter: Payer: Medicare Other | Admitting: Orthopedic Surgery

## 2022-12-17 ENCOUNTER — Encounter: Payer: Self-pay | Admitting: Orthopedic Surgery

## 2022-12-17 ENCOUNTER — Telehealth (INDEPENDENT_AMBULATORY_CARE_PROVIDER_SITE_OTHER): Payer: Medicare Other | Admitting: Orthopedic Surgery

## 2022-12-17 DIAGNOSIS — U071 COVID-19: Secondary | ICD-10-CM

## 2022-12-17 NOTE — Patient Instructions (Signed)
Start vitamin C 1000 mg daily for 7-10 days  Start Zinc 50 mg daily for 7-10 days  Continue vitamin C  Rest and hydrate!   Contact provider if symptoms worsen or do not resolve

## 2022-12-17 NOTE — Progress Notes (Signed)
   This service is provided via telemedicine  No vital signs collected/recorded due to the encounter was a telemedicine visit.   Location of patient (ex: home, work):  Home  Patient consents to a telephone visit:  Yes  Location of the provider (ex: office, home):  [employer].   Name of any referring provider:  Virgie Dad, MD   Names of all persons participating in the telemedicine service and their role in the encounter:  Patient, Heriberto Antigua, [place], Windell Moulding, NP.    Time spent on call: 8 minutes spent on the phone with Medical Assistant.

## 2022-12-17 NOTE — Progress Notes (Signed)
Careteam: Patient Care Team: Virgie Dad, MD as PCP - General (Internal Medicine)  Seen by: Windell Moulding, AGNP-C  PLACE OF SERVICE:  [place] Directive information Does Patient Have a Medical Advance Directive?: Yes, Type of Advance Directive: [place];Living will;Out of facility DNR (pink MOST or yellow form), Does patient want to make changes to medical advance directive?: No - Patient declined  Allergies  Allergen Reactions   Zocor [Simvastatin] Other (See Comments)    Memory loss   Niaspan Durene Cal Er] Hives    Chief Complaint  Patient presents with   Acute Visit    Patient complains of testing positive for Covid-19 Saturday 12/15/2022. Patient symptoms are body aches, cough, and nasal congestion.      HPI: Patient is a 77 y.o. male seen today for virtual visit due to positive covid test.   02/17 tested positive for covid. Symptoms include: body aches, dry cough, and malaise. Today, he reports improved symptoms. He has not taken any medications to help with symptoms. He lives in a senior living community which has had a recent covid outbreak. He is UTD on covid and flu vaccines. Treatment options discussed today.   Review of Systems:  Review of Systems  Constitutional:  Positive for malaise/fatigue. Negative for fever.  HENT:  Positive for sinus pain. Negative for sore throat.   Eyes:  Negative for blurred vision.  Respiratory:  Positive for cough. Negative for sputum production, shortness of breath and wheezing.   Cardiovascular:  Negative for chest pain and leg swelling.  Gastrointestinal:  Negative for nausea and vomiting.  Musculoskeletal:  Positive for myalgias.  Neurological:  Negative for headaches.  Psychiatric/Behavioral:  Negative for depression. The patient is not nervous/anxious.     Past Medical History:  Diagnosis Date   Arthritis    neck   Cataract    Cervicalgia 03/29/2015   History of colonic polyps     Hyperlipidemia    Hypertension    Thrombocytopenia (HCC)    Vitamin D deficiency    Past Surgical History:  Procedure Laterality Date   ARM SKIN LESION BIOPSY / EXCISION Left 02/2016   atypical lentigmous and nested melanocytic proliferation--Dr. Harriett Sine   CATARACT EXTRACTION Bilateral january and february 2018   ORIF SCAPHOID FRACTURE Right 01/24/2022   Procedure: RIGHT OPEN REDUCTION INTERNAL FIXATION (ORIF) SCAPHOID FRACTURE;  Surgeon: Sherilyn Cooter, MD;  Location: [facility];  Service: Orthopedics;  Laterality: Right;   Social History:   reports that he has never smoked. He has never used smokeless tobacco. He reports that he does not currently use alcohol. He reports that he does not use drugs.  Family History  Problem Relation Age of Onset   Clotting disorder Father    Suicidality Father    High Cholesterol Mother    Renal Disease Mother    Stroke Mother 77   Colon cancer Neg Hx    Esophageal cancer Neg Hx    Stomach cancer Neg Hx    Rectal cancer Neg Hx     Medications: Patient's Medications  New Prescriptions   No medications on file  Previous Medications   ASPIRIN EC 81 MG TABLET    Take 81 mg by mouth daily.   CHOLECALCIFEROL 50 MCG (2000 UT) TABS    Take 2,000 Units by mouth daily.   FENOFIBRATE 160 MG TABLET    TAKE 1 TABLET BY MOUTH  DAILY   HYDROCHLOROTHIAZIDE (HYDRODIURIL) 25 MG TABLET  TAKE 1 TABLET BY MOUTH  DAILY   LISINOPRIL (ZESTRIL) 40 MG TABLET    TAKE 1 TABLET BY MOUTH  DAILY  Modified Medications   No medications on file  Discontinued Medications   No medications on file    Physical Exam:  There were no vitals filed for this visit. There is no height or weight on file to calculate BMI. Wt Readings from Last 3 Encounters:  08/08/22 193 lb 9.6 oz (87.8 kg)  06/06/22 192 lb 14.4 oz (87.5 kg)  03/30/22 196 lb 6.4 oz (89.1 kg)    Physical Exam Vitals (Exam limited due to virtual visit) reviewed.  Constitutional:      Appearance: He  is not ill-appearing.  Neurological:     Mental Status: He is alert.     Labs reviewed: Basic Metabolic Panel: Recent Labs    01/24/22 0554 05/31/22 0835  NA 141 141  K 3.6 3.7  CL 111 104  CO2 23 30  GLUCOSE 100* 73  BUN 22 20  CREATININE 1.09 1.18  CALCIUM 9.1 9.5   Liver Function Tests: Recent Labs    05/31/22 0835  AST 20  ALT 22  BILITOT 0.7  PROT 6.4   No results for input(s): "LIPASE", "AMYLASE" in the last 8760 hours. No results for input(s): "AMMONIA" in the last 8760 hours. CBC: Recent Labs    01/24/22 0554 05/31/22 0835  WBC 4.7 5.1  NEUTROABS  --  3,443  HGB 14.6 15.8  HCT 42.8 45.8  MCV 88.8 90.7  PLT 127* 142   Lipid Panel: Recent Labs    05/31/22 0835  CHOL 152  HDL 51  LDLCALC 85  TRIG 75  CHOLHDL 3.0   TSH: No results for input(s): "TSH" in the last 8760 hours. A1C: No results found for: "HGBA1C"   Assessment/Plan 1. COVID - 02/17 tested positive - mild symptoms> nasal congestion, dry cough, malaise and body aches - symptoms have improved today - does not want Paxlovid - recommend vitamin C 1000 mg daily x 7-10 days - cont Vitamin D  - start zinc 50 mg daily for 7-10 days - contact PCP if symptoms do not improve or worsen - advised to follow [facility] isolation guidelines  Total time: 12 minutes. Greater than 50% of total time spent doing patient education regarding covid including symptom/medication management.    Virtual Visit   I connected with Brandon [name] via virtual visit and verified that I am speaking with the correct person using two identifiers.  Location: [facility] Patient: Brandon [name] Provider: Yvonna Alanis, NP    I discussed the limitations, risks, security and privacy concerns of performing an evaluation and management service by telephone and the availability of in person appointments. I also discussed with the patient that there may be a patient responsible charge related to this service. The  patient expressed understanding and agreed to proceed.   I discussed the assessment and treatment plan with the patient. The patient was provided an opportunity to ask questions and all were answered. The patient agreed with the plan and demonstrated an understanding of the instructions.   The patient was advised to call back or seek an in-person evaluation if the symptoms worsen or if the condition fails to improve as anticipated.  I provided 12 minutes of face-to-face time during this encounter.  Eboney Claybrook Cleophas Dunker, [place] printed and mailed    Next appt: none Arelys Glassco [place], [facility] [phone]

## 2022-12-18 NOTE — Progress Notes (Signed)
This encounter was created in error - please disregard.

## 2022-12-24 ENCOUNTER — Non-Acute Institutional Stay (INDEPENDENT_AMBULATORY_CARE_PROVIDER_SITE_OTHER): Payer: Medicare Other | Admitting: Orthopedic Surgery

## 2022-12-24 ENCOUNTER — Encounter: Payer: Self-pay | Admitting: Orthopedic Surgery

## 2022-12-24 VITALS — BP 139/83 | HR 71 | Temp 96.9°F | Resp 16 | Ht 73.0 in | Wt 191.1 lb

## 2022-12-24 DIAGNOSIS — Z0000 Encounter for general adult medical examination without abnormal findings: Secondary | ICD-10-CM

## 2022-12-24 NOTE — Progress Notes (Signed)
Subjective:   Brandon [name] is a 77 y.o. male who presents for Medicare Annual/Subsequent preventive examination.  Place of Service: [facility] Provider: Windell Moulding, AGNP-C   Review of Systems     Cardiac Risk Factors include: hypertension;male gender;advanced age (>9mn, >>26women)     Objective:    Today's Vitals   12/24/22 1332  BP: 139/83  Pulse: 71  Resp: 16  Temp: (!) 96.9 F (36.1 C)  SpO2: 97%  Weight: 191 lb 1.6 oz (86.7 kg)  Height: '6\' 1"'$  (1.854 m)   Body mass index is 25.21 kg/m.     12/24/2022    1:33 PM 12/17/2022   10:53 AM 08/08/2022    2:57 PM 06/06/2022    1:57 PM 04/25/2022    5:57 PM 03/30/2022    2:08 PM 01/24/2022    6:09 AM  Advanced Directives  Does Patient Have a Medical Advance Directive? Yes Yes Yes Yes No Yes Yes  Type of AParamedicof [place]own[place] will;Out of facility DNR (pink MOST or yellow form) H[place]omon[place]Living will;Out of facility DNR (pink MOST or yellow form) HGerrardOut of facility DNR (pink MOST or yellow form);Living will   HNorth WantaghLiving will HVinegar BendLiving will  Does patient want to make changes to medical advance directive? No - Patient declined No - Patient declined No - Patient declined Yes (Inpatient - patient requests chaplain consult to change a medical advance directive)  No - Patient declined No - Patient declined  Copy of [place] Chart? Yes - validated most recent copy scanned in chart (See row information) Yes - validated most recent copy scanned in chart (See row information) No - copy requested    No - copy requested  Would patient like information on creating a medical advance directive?     No - Patient declined      Current Medications (verified) Outpatient Encounter Medications as of 12/24/2022  Medication Sig   aspirin EC 81 MG tablet Take 81 mg by mouth daily.    Cholecalciferol 50 MCG (2000 UT) TABS Take 2,000 Units by mouth daily.   fenofibrate 160 MG tablet TAKE 1 TABLET BY MOUTH  DAILY   hydrochlorothiazide (HYDRODIURIL) 25 MG tablet TAKE 1 TABLET BY MOUTH  DAILY   lisinopril (ZESTRIL) 40 MG tablet TAKE 1 TABLET BY MOUTH  DAILY   No facility-administered encounter medications on file as of 12/24/2022.    Allergies (verified) Zocor [simvastatin] and Niaspan [niacin er]   History: Past Medical History:  Diagnosis Date   Arthritis    neck   Cataract    Cervicalgia 03/29/2015   History of colonic polyps    Hyperlipidemia    Hypertension    Thrombocytopenia (HCC)    Vitamin D deficiency    Past Surgical History:  Procedure Laterality Date   ARM SKIN LESION BIOPSY / EXCISION Left 02/2016   atypical lentigmous and nested melanocytic proliferation--Dr. WHarriett Sine  CATARACT EXTRACTION Bilateral january and february 2018   ORIF SCAPHOID FRACTURE Right 01/24/2022   Procedure: RIGHT OPEN REDUCTION INTERNAL FIXATION (ORIF) SCAPHOID FRACTURE;  Surgeon: BSherilyn Cooter MD;  Location[place] [facility]  Service: Orthopedics;  Laterality: Right;   Family History  Problem Relation Age of Onset   Clotting disorder Father    Suicidality Father    High Cholesterol Mother    Renal Disease Mother    Stroke Mother 776  Colon cancer Neg Hx    Esophageal cancer Neg Hx    Stomach cancer Neg Hx    Rectal cancer Neg Hx    Social History   Socioeconomic History   Marital status: Married    Spouse name: Not on file   Number of children: Not on file   Years of education: Not on file   Highest education level: Not on file  Occupational History   Not on file  Tobacco Use   Smoking status: Never   Smokeless tobacco: Never  Vaping Use   Vaping Use: Never used  Substance and Sexual Activity   Alcohol use: Not Currently    Comment: ocassional   Drug use: No   Sexual activity: Not Currently  Other Topics Concern   Not on file  Social History  Narrative   Diet- well balanced   Caffeine- Yes (coffee)   Married- Yes, [place]- Yes, 1 story with 2 persons   Pets- 1 cat   Current/Past profession- Gaffer   Exercise- Yes ( walking and aerobics)   Living Will- Yes   DNR- N/A   POA/HPOA- N/A      Social Determinants of Health   Financial Resource Strain: Low Risk  (12/24/2022)   Overall Financial Resource Strain (CARDIA)    Difficulty of Paying Living Expenses: Not hard at all  Food Insecurity: No Food Insecurity (12/24/2022)   Hunger Vital Sign    Worried About Running Out of Food in the Last Year: Never true    [place] in the Last Year: Never true  Transportation Needs: No Transportation Needs (12/24/2022)   PRAPARE - Hydrologist (Medical): No    Lack of Transportation (Non-Medical): No  Physical Activity: Sufficiently Active (12/24/2022)   Exercise Vital Sign    Days of Exercise per Week: 6 days    Minutes of Exercise per Session: 30 min  Stress: No Stress Concern Present (12/24/2022)   [place]    Feeling of Stress : Only a little  Social Connections: Moderately Isolated (12/24/2022)   Social Connection and Isolation Panel [NHANES]    Frequency of Communication with Friends and Family: Twice a week    Frequency of Social Gatherings with Friends and Family: Twice a week    Attends Religious Services: Never    Marine scientist or Organizations: No    Attends Music therapist: Never    Marital Status: Married    Tobacco Counseling Counseling given: Not Answered   Clinical Intake:  Pre-visit preparation completed: No  Pain : No/denies pain     BMI - recorded: 25.21 Nutritional Status: BMI 25 -29 Overweight Nutritional Risks: None Diabetes: No  How often do you need to have someone help you when you read instructions, pamphlets, or other written materials from your doctor  or pharmacy?: 1 - Never What is the last grade level you completed in school?: Masters degree  Diabetic?No  Interpreter Needed?: No      Activities of Daily Living    12/24/2022    1:52 PM 01/24/2022    6:08 AM  In your present state of health, do you have any difficulty performing the following activities:  Hearing? 0 0  Vision? 0 0  Difficulty concentrating or making decisions? 0 0  Walking or climbing stairs? 0 0  Dressing or bathing? 0 0  Doing errands, shopping? 0  Preparing Food and eating ? N   Using the Toilet? N   In the past six months, have you accidently leaked urine? N   Do you have problems with loss of bowel control? N   Managing your Medications? N   Managing your Finances? N   Housekeeping or managing your Housekeeping? N     Patient Care Team: Virgie Dad, MD as PCP - General (Internal Medicine)  Indicate any recent Medical Services you may have received from other than [facility] providers in the past year (date may be approximate).     Assessment:   This is a routine wellness examination for Brandon [name].  Hearing/Vision screen Hearing Screening - Comments:: No hearing concerns.  Vision Screening - Comments:: No vision concerns. Patient wears reading glasses.   Dietary issues and exercise activities discussed: Current Exercise Habits: Structured exercise class, Type of exercise: Other - see comments (swimming), Time (Minutes): 30, Frequency (Times/Week): 6, Weekly Exercise (Minutes/Week): 180, Intensity: Moderate, Exercise limited by: cardiac condition(s)   Goals Addressed             This Visit's Progress    Exercise 3x per week (30 min per time)   On track    I will continue with my biking.        Depression Screen    12/24/2022    1:32 PM 08/08/2022    2:57 PM 12/01/2021   11:21 AM 11/28/2020    8:19 AM 05/26/2020    7:36 AM 11/20/2019    8:51 AM 05/21/2019    8:06 AM  PHQ 2/9 Scores  PHQ - 2 Score 0 0 0 0 0 0 0    Fall Risk     12/24/2022    1:51 PM 12/24/2022    1:32 PM 12/17/2022   10:53 AM 08/08/2022    2:57 PM 06/06/2022    1:56 PM  [place] in the past year? 0 0 0  0  Number falls in past yr: 0 0 0 0 0  Injury with Fall? 0 0 0 1 0  Risk for fall due to : History of fall(s) No Fall Risks No Fall Risks  No Fall Risks  Follow up Falls evaluation completed;Education provided Falls evaluation completed Falls evaluation completed  Falls evaluation completed    [place]:  Any stairs in or around the home? No  If so, are there any without handrails? Yes  Home free of loose throw rugs in walkways, pet beds, electrical cords, etc? Yes  Adequate lighting in your home to reduce risk of falls? Yes   ASSISTIVE DEVICES UTILIZED TO PREVENT FALLS:  Life alert? No  Use of a cane, walker or w/c? No  Grab bars in the bathroom? Yes  Shower chair or bench in shower? No  Elevated toilet seat or a handicapped toilet? Yes   TIMED UP AND GO:  Was the test performed? No .  Length of time to ambulate 10 feet: N/A sec.   Gait steady and fast without use of assistive device  Cognitive Function:    12/24/2022    1:33 PM 11/28/2020    8:23 AM 11/17/2018    3:02 PM 10/28/2017    9:34 AM 10/25/2016    8:39 AM  MMSE - Mini Mental State Exam  Orientation to time '5 5 5 5 5  '$ Orientation to time comments  2022, Winter, 31st, Monday, January.     Orientation to Place 5  $'5 4 5 5  'T$ Orientation to Place-comments  [place], [place], [place], [employer].     Registration '3 3 3 3 3  '$ Attention/ Calculation '5 5 5 5 5  '$ Recall '3 3 3 3 3  '$ Language- name 2 objects '2 2 2 2 2  '$ Language- repeat '1 1 1 1 1  '$ Language- follow 3 step command '3 3 3 3 2  '$ Language- read & follow direction 1 0 '1 1 1  '$ Write a sentence '1 1 1 1 1  '$ Copy design '1 1 1 1 1  '$ Total score '30 29 29 30 29        '$ 12/01/2021   11:23 AM 11/20/2019    8:52 AM  6CIT Screen  What Year? 0 points 0 points  What month? 0 points 0  points  What time? 0 points 0 points  Count back from 20 0 points 0 points  Months in reverse 0 points 0 points  Repeat phrase 0 points 0 points  Total Score 0 points 0 points    Immunizations Immunization History  Administered Date(s) Administered   Fluad Quad(high Dose 65+) 06/23/2019   Influenza, High Dose Seasonal PF 08/03/2021, 06/29/2022   Influenza,inj,Quad PF,6+ Mos 07/25/2015, 08/03/2016, 07/29/2018   Influenza-Unspecified 08/29/2017   PFIZER Comirnaty(Gray Top)Covid-19 Tri-Sucrose Vaccine 02/17/2021   PFIZER(Purple Top)SARS-COV-2 Vaccination 12/03/2019, 12/28/2019, 07/26/2020   Pfizer Covid-19 Vaccine Bivalent Booster 24yr & up 07/19/2021, 04/11/2022   Pneumococcal Conjugate-13 03/21/2015   Pneumococcal Polysaccharide-23 04/06/2016   Tdap 10/30/2007, 05/27/2018   Zoster Recombinat (Shingrix) 03/08/2018, 05/27/2018   Zoster, Live 10/29/2012    TDAP status: Up to date  Flu Vaccine status: Up to date  Pneumococcal vaccine status: Up to date  Covid-19 vaccine status: Completed vaccines  Qualifies for Shingles Vaccine? Yes   Zostavax completed Yes   Shingrix Completed?: Yes  Screening Tests Health Maintenance  Topic Date Due   COVID-19 Vaccine (7 - 2023-24 season) 06/29/2022   Medicare Annual Wellness (AWV)  12/25/2023   COLONOSCOPY (Pts [phone] coverage will need to be confirmed)  06/24/2024   DTaP/Tdap/Td (3 - Td or Tdap) 05/27/2028   Pneumonia Vaccine 6530Years old  Completed   INFLUENZA VACCINE  Completed   Hepatitis C Screening  Completed   Zoster Vaccines- Shingrix  Completed   HPV VACCINES  Aged Out    Health Maintenance  Health Maintenance Due  Topic Date Due   COVID-19 Vaccine (7 - 2023-24 season) 06/29/2022    Colorectal cancer screening: No longer required.   Lung Cancer Screening: (Low Dose CT Chest recommended if Age 76-80ears, 30 pack-year currently smoking OR have quit w/in 15years.) does not qualify.   Lung Cancer  Screening Referral: No  Additional Screening: does not Hepatitis C Screening:  qualify; Completed   Vision Screening: Recommended annual ophthalmology exams for early detection of glaucoma and other disorders of the eye. Is the patient up to date with their annual eye exam?  Yes  Who is the provider or what is the name of the office in which the patient attends annual eye exams? Dr. LyPrudencio Burlyf pt is not established with a provider, would they like to be referred to a provider to establish care? No .   Dental Screening: Recommended annual dental exams for proper oral hygiene  Community Resource Referral / Chronic Care Management: CRR required this visit?  No   CCM required this visit?  No      Plan:     I have  personally reviewed and noted the following in the patient's chart:   Medical and social history Use of alcohol, tobacco or illicit drugs  Current medications and supplements including opioid prescriptions. Patient is not currently taking opioid prescriptions. Functional ability and status Nutritional status Physical activity Advanced directives List of other physicians Hospitalizations, surgeries, and ER visits in previous 12 months Vitals Screenings to include cognitive, depression, and falls Referrals and appointments  In addition, I have reviewed and discussed with patient certain preventive protocols, quality metrics, and best practice recommendations. A written personalized care plan for preventive services as well as general preventive health recommendations were provided to patient.     Yvonna Alanis, NP   12/24/2022   Nurse Notes: none

## 2022-12-27 ENCOUNTER — Other Ambulatory Visit: Payer: Self-pay | Admitting: Internal Medicine

## 2022-12-27 ENCOUNTER — Other Ambulatory Visit: Payer: Medicare Other

## 2022-12-27 DIAGNOSIS — I10 Essential (primary) hypertension: Secondary | ICD-10-CM

## 2022-12-27 DIAGNOSIS — E782 Mixed hyperlipidemia: Secondary | ICD-10-CM

## 2022-12-31 ENCOUNTER — Other Ambulatory Visit: Payer: Medicare Other

## 2022-12-31 DIAGNOSIS — I10 Essential (primary) hypertension: Secondary | ICD-10-CM | POA: Diagnosis not present

## 2022-12-31 DIAGNOSIS — E782 Mixed hyperlipidemia: Secondary | ICD-10-CM | POA: Diagnosis not present

## 2023-01-01 ENCOUNTER — Other Ambulatory Visit: Payer: Medicare Other

## 2023-01-01 ENCOUNTER — Encounter: Payer: Medicare Other | Admitting: Internal Medicine

## 2023-01-01 LAB — CBC WITH DIFFERENTIAL/PLATELET
Absolute Monocytes: 467 cells/uL (ref 200–950)
Basophils Absolute: 40 cells/uL (ref 0–200)
Basophils Relative: 0.7 %
Eosinophils Absolute: 120 cells/uL (ref 15–500)
Eosinophils Relative: 2.1 %
HCT: 44.2 % (ref 38.5–50.0)
Hemoglobin: 15.3 g/dL (ref 13.2–17.1)
Lymphs Abs: 1248 cells/uL (ref 850–3900)
MCH: 30.1 pg (ref 27.0–33.0)
MCHC: 34.6 g/dL (ref 32.0–36.0)
MCV: 87 fL (ref 80.0–100.0)
MPV: 11.6 fL (ref 7.5–12.5)
Monocytes Relative: 8.2 %
Neutro Abs: 3825 cells/uL (ref 1500–7800)
Neutrophils Relative %: 67.1 %
Platelets: 191 10*3/uL (ref 140–400)
RBC: 5.08 10*6/uL (ref 4.20–5.80)
RDW: 11.8 % (ref 11.0–15.0)
Total Lymphocyte: 21.9 %
WBC: 5.7 10*3/uL (ref 3.8–10.8)

## 2023-01-01 LAB — LIPID PANEL
Cholesterol: 145 mg/dL (ref <200)
HDL: 50 mg/dL (ref >=40)
LDL Cholesterol (Calc): 82 mg/dL (calc)
Non-HDL Cholesterol (Calc): 95 mg/dL (calc) (ref <130)
Total CHOL/HDL Ratio: 2.9 (calc) (ref <5.0)
Triglycerides: 54 mg/dL (ref <150)

## 2023-01-01 LAB — COMPLETE METABOLIC PANEL WITH GFR
AG Ratio: 2.1 (calc) (ref 1.0–2.5)
ALT: 18 U/L (ref 9–46)
AST: 19 U/L (ref 10–35)
Albumin: 4.2 g/dL (ref 3.6–5.1)
Alkaline phosphatase (APISO): 49 U/L (ref 35–144)
BUN/Creatinine Ratio: 28 (calc) — ABNORMAL HIGH (ref 6–22)
BUN: 32 mg/dL — ABNORMAL HIGH (ref 7–25)
CO2: 26 mmol/L (ref 20–32)
Calcium: 9.4 mg/dL (ref 8.6–10.3)
Chloride: 106 mmol/L (ref 98–110)
Creat: 1.15 mg/dL (ref 0.70–1.28)
Globulin: 2 g/dL (calc) (ref 1.9–3.7)
Glucose, Bld: 77 mg/dL (ref 65–99)
Potassium: 3.9 mmol/L (ref 3.5–5.3)
Sodium: 143 mmol/L (ref 135–146)
Total Bilirubin: 0.7 mg/dL (ref 0.2–1.2)
Total Protein: 6.2 g/dL (ref 6.1–8.1)
eGFR: 66 mL/min/{1.73_m2} (ref >=60)

## 2023-01-01 LAB — TSH: TSH: 0.96 mIU/L (ref 0.40–4.50)

## 2023-01-02 ENCOUNTER — Non-Acute Institutional Stay: Payer: Medicare Other | Admitting: Internal Medicine

## 2023-01-02 ENCOUNTER — Encounter: Payer: Self-pay | Admitting: Internal Medicine

## 2023-01-02 VITALS — BP 136/84 | HR 79 | Temp 97.8°F | Resp 17 | Ht 73.0 in | Wt 191.7 lb

## 2023-01-02 DIAGNOSIS — I10 Essential (primary) hypertension: Secondary | ICD-10-CM

## 2023-01-02 DIAGNOSIS — M542 Cervicalgia: Secondary | ICD-10-CM

## 2023-01-02 DIAGNOSIS — E782 Mixed hyperlipidemia: Secondary | ICD-10-CM

## 2023-01-02 NOTE — Patient Instructions (Signed)
Can use OTC Voltaren Gel as needed for neck pain Can apply 3-4 times a day  Also use Aleve once a day for extreme pain. Take with Food. Not to take more then 2-3 days.

## 2023-01-02 NOTE — Progress Notes (Signed)
Location:  [facility] (12)  Provider:   Code Status: DNR Goals of Care:     01/02/2023    8:09 AM  Advanced Directives  Does Patient Have a Medical Advance Directive? Yes  Type of Paramedic of [place];Living will;Out of facility DNR (pink MOST or yellow form)  Does patient want to make changes to medical advance directive? No - Guardian declined  Copy of [place] in Chart? Yes - validated most recent copy scanned in chart (See row information)     Chief Complaint  Patient presents with   Medical Management of Chronic Issues    5 month follow up with labs . With a cough after covid     HPI: Patient is a 77 y.o. male seen today for medical management of chronic diseases.    Lives in [place] in [place]  Has h/o HTN,HLD DJD in his Neck  Recent Covid  Continues to do well Does have neck pain when riding on his bike for long distance Still wants to ride to [place] in May  Continues to have some issues with Word findings Not progressive No Cognitiv eissues Has had some chest discomfort before with Hiking. But nothing recently He did go for biking few days ago and had no issues  Lives with his wife Independent in [place]  Past Medical History:  Diagnosis Date   Arthritis    neck   Cataract    Cervicalgia 03/29/2015   History of colonic polyps    Hyperlipidemia    Hypertension    Thrombocytopenia ([place])    Vitamin D deficiency     Past Surgical History:  Procedure Laterality Date   ARM SKIN LESION BIOPSY / EXCISION Left 02/2016   atypical lentigmous and nested melanocytic proliferation--Dr. Harriett Sine   CATARACT EXTRACTION Bilateral january and february 2018   ORIF SCAPHOID FRACTURE Right 01/24/2022   Procedure: RIGHT OPEN REDUCTION INTERNAL FIXATION (ORIF) SCAPHOID FRACTURE;  Surgeon: Sherilyn Cooter, MD;  Location: [place];  Service: Orthopedics;  Laterality:  Right;    Allergies  Allergen Reactions   Zocor [Simvastatin] Other (See Comments)    Memory loss   Niaspan [Niacin Er] Hives    Outpatient Encounter Medications as of 01/02/2023  Medication Sig   aspirin EC 81 MG tablet Take 81 mg by mouth daily.   Cholecalciferol 50 MCG (2000 UT) TABS Take 2,000 Units by mouth daily.   fenofibrate 160 MG tablet TAKE 1 TABLET BY MOUTH  DAILY   hydrochlorothiazide (HYDRODIURIL) 25 MG tablet TAKE 1 TABLET BY MOUTH  DAILY   lisinopril (ZESTRIL) 40 MG tablet TAKE 1 TABLET BY MOUTH  DAILY   No facility-administered encounter medications on file as of 01/02/2023.    Review of Systems:  Review of Systems  Constitutional:  Negative for activity change, appetite change and unexpected weight change.  HENT: Negative.    Respiratory:  Negative for cough and shortness of breath.   Cardiovascular:  Negative for leg swelling.  Gastrointestinal:  Negative for constipation.  Genitourinary:  Positive for frequency.  Musculoskeletal:  Positive for neck pain. Negative for arthralgias, gait problem and myalgias.  Skin: Negative.  Negative for rash.  Neurological:  Negative for dizziness and weakness.  Psychiatric/Behavioral:  Negative for confusion and sleep disturbance.   All other systems reviewed and are negative.   Health Maintenance  Topic Date Due   COVID-19 Vaccine (7 -  2023-24 season) 06/28/2023 (Originally 06/29/2022)   Medicare Annual Wellness (AWV)  12/25/2023   COLONOSCOPY (Pts 45-26yr Insurance coverage will need to be confirmed)  06/24/2024   DTaP/Tdap/Td (3 - Td or Tdap) 05/27/2028   Pneumonia Vaccine 76 Years old  Completed   INFLUENZA VACCINE  Completed   Hepatitis C Screening  Completed   Zoster Vaccines- Shingrix  Completed   HPV VACCINES  Aged Out    Physical Exam: Vitals:   01/02/23 0806  BP: 136/84  Pulse: 79  Resp: 17  Temp: 97.8 F (36.6 C)  TempSrc: Temporal  SpO2: 96%  Weight: 191 lb 11.2 oz (87 kg)  Height: '6\' 1"'$  (1.854  m)   Body mass index is 25.29 kg/m. Physical Exam Vitals reviewed.  Constitutional:      Appearance: Normal appearance.  HENT:     Head: Normocephalic.     Right Ear: Tympanic membrane normal.     Left Ear: Tympanic membrane normal.     Nose: Nose normal.     Mouth/Throat:     Mouth: Mucous membranes are moist.     Pharynx: Oropharynx is clear.  Eyes:     Pupils: Pupils are equal, round, and reactive to light.  Cardiovascular:     Rate and Rhythm: Normal rate and regular rhythm.     Pulses: Normal pulses.     Heart sounds: No murmur heard. Pulmonary:     Effort: Pulmonary effort is normal. No respiratory distress.     Breath sounds: Normal breath sounds. No rales.  Abdominal:     General: Abdomen is flat. Bowel sounds are normal.     Palpations: Abdomen is soft.  Musculoskeletal:        General: No swelling.     Cervical back: Neck supple. No rigidity.  Skin:    General: Skin is warm.  Neurological:     General: No focal deficit present.     Mental Status: He is alert and oriented to person, place, and time.  Psychiatric:        Mood and Affect: Mood normal.        Thought Content: Thought content normal.     Labs reviewed: Basic Metabolic Panel: Recent Labs    01/24/22 0554 05/31/22 0835 12/31/22 0816  NA 141 141 143  K 3.6 3.7 3.9  CL 111 104 106  CO2 '23 30 26  '$ GLUCOSE 100* 73 77  BUN 22 20 32*  CREATININE 1.09 1.18 1.15  CALCIUM 9.1 9.5 9.4  TSH  --   --  0.96   Liver Function Tests: Recent Labs    05/31/22 0835 12/31/22 0816  AST 20 19  ALT 22 18  BILITOT 0.7 0.7  PROT 6.4 6.2   No results for input(s): "LIPASE", "AMYLASE" in the last 8760 hours. No results for input(s): "AMMONIA" in the last 8760 hours. CBC: Recent Labs    01/24/22 0554 05/31/22 0835 12/31/22 0816  WBC 4.7 5.1 5.7  NEUTROABS  --  3,443 3,825  HGB 14.6 15.8 15.3  HCT 42.8 45.8 44.2  MCV 88.8 90.7 87.0  PLT 127* 142 191   Lipid Panel: Recent Labs     05/31/22 0835 12/31/22 0816  CHOL 152 145  HDL 51 50  LDLCALC 85 82  TRIG 75 54  CHOLHDL 3.0 2.9   No results found for: "HGBA1C"  Procedures since last visit: No results found.  Assessment/Plan 1. Essential hypertension Lisinopril and HCTZ   - TSH - COMPLETE METABOLIC  PANEL WITH GFR - CBC with Differential/Platelet  2. Mixed hyperlipidemia LDL is good - Lipid panel  3. Cervicalgia Did therapy Can use Tylenol And Voltaren PRN  4 Chest discomfort Has not happened recently Does not want any Cardiac Testing like CTA or Stress test      Labs/tests ordered:  * No order type specified * Next appt:  Visit date not found  Total time spent in this patient care encounter was 45 _  minutes; greater than 50% of the visit spent counseling patient and staff, reviewing records , Labs and coordinating care for problems addressed at this encounter.

## 2023-01-07 DIAGNOSIS — D487 Neoplasm of uncertain behavior of other specified sites: Secondary | ICD-10-CM | POA: Diagnosis not present

## 2023-01-07 DIAGNOSIS — D485 Neoplasm of uncertain behavior of skin: Secondary | ICD-10-CM | POA: Diagnosis not present

## 2023-01-07 DIAGNOSIS — Z85828 Personal history of other malignant neoplasm of skin: Secondary | ICD-10-CM | POA: Diagnosis not present

## 2023-01-07 DIAGNOSIS — Z85820 Personal history of malignant melanoma of skin: Secondary | ICD-10-CM | POA: Diagnosis not present

## 2023-05-30 DIAGNOSIS — H401411 Capsular glaucoma with pseudoexfoliation of lens, right eye, mild stage: Secondary | ICD-10-CM | POA: Diagnosis not present

## 2023-05-30 DIAGNOSIS — Z961 Presence of intraocular lens: Secondary | ICD-10-CM | POA: Diagnosis not present

## 2023-05-30 IMAGING — CR DG WRIST COMPLETE 3+V*R*
4 series · 4 of 4 positions shown · non-contrast
Comparison: None.

CLINICAL DATA: Right wrist injury 1 month ago, intermittent
posterior pain, limited range of motion

EXAM:
RIGHT WRIST - COMPLETE 3+ VIEW

[x wrist pa right]
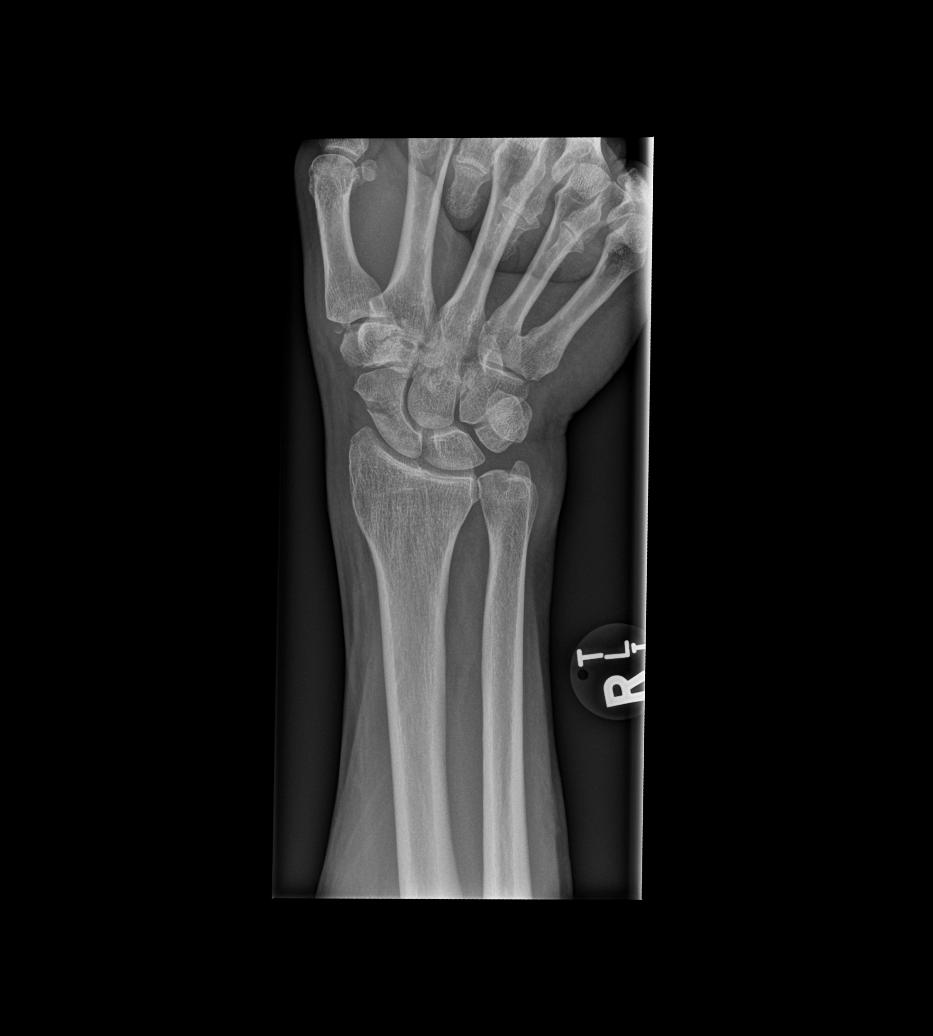

[x wrist obl right]
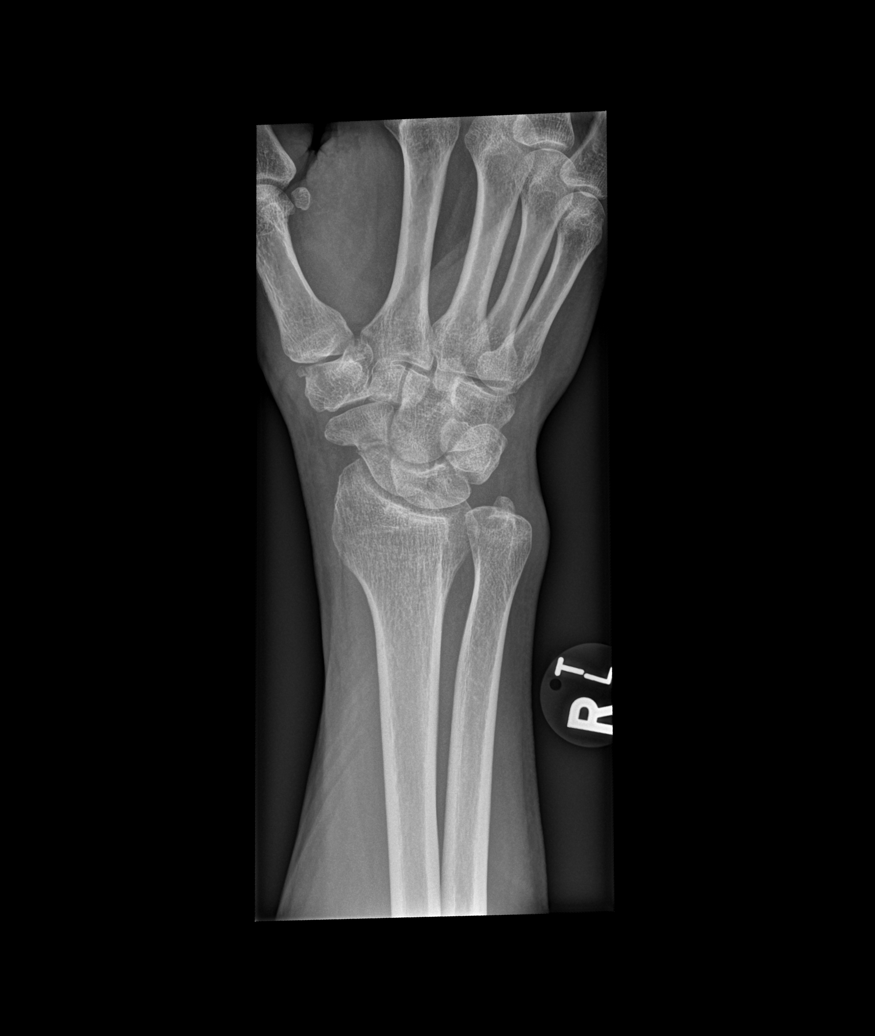

[x wrist lat right]
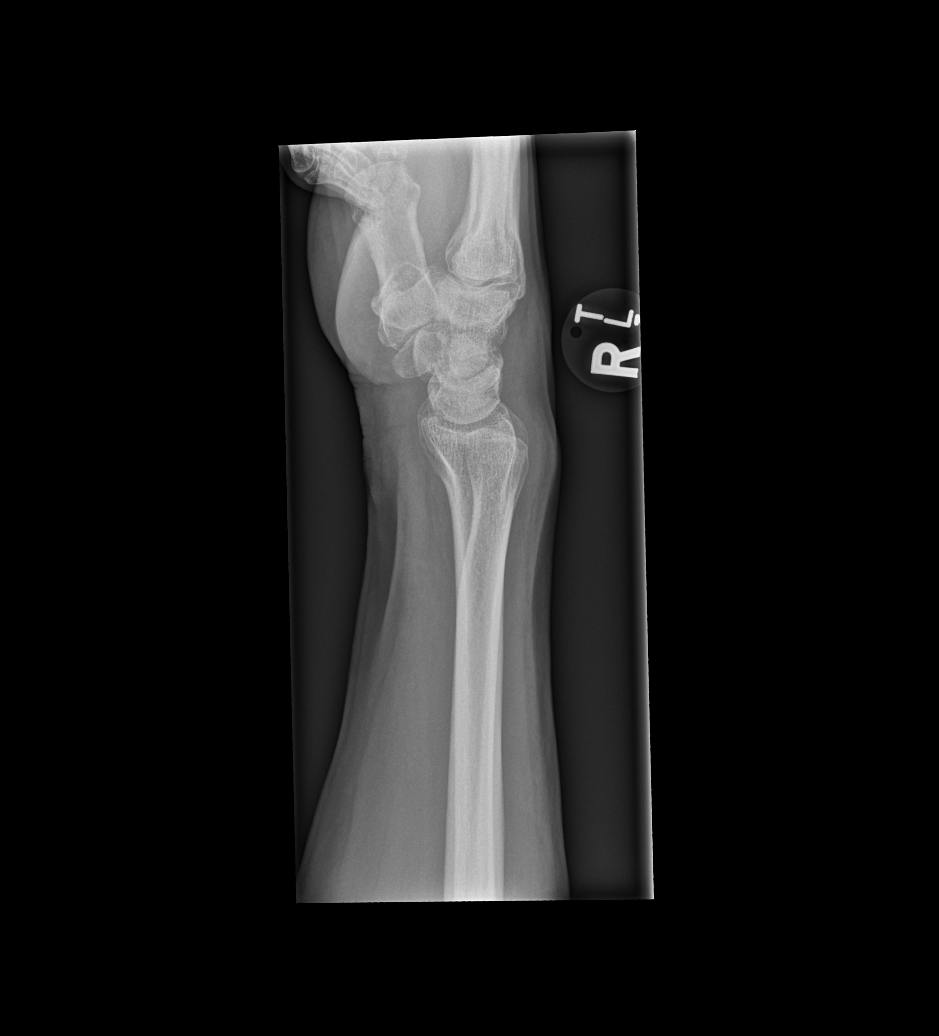

[x wrist navicular view right]
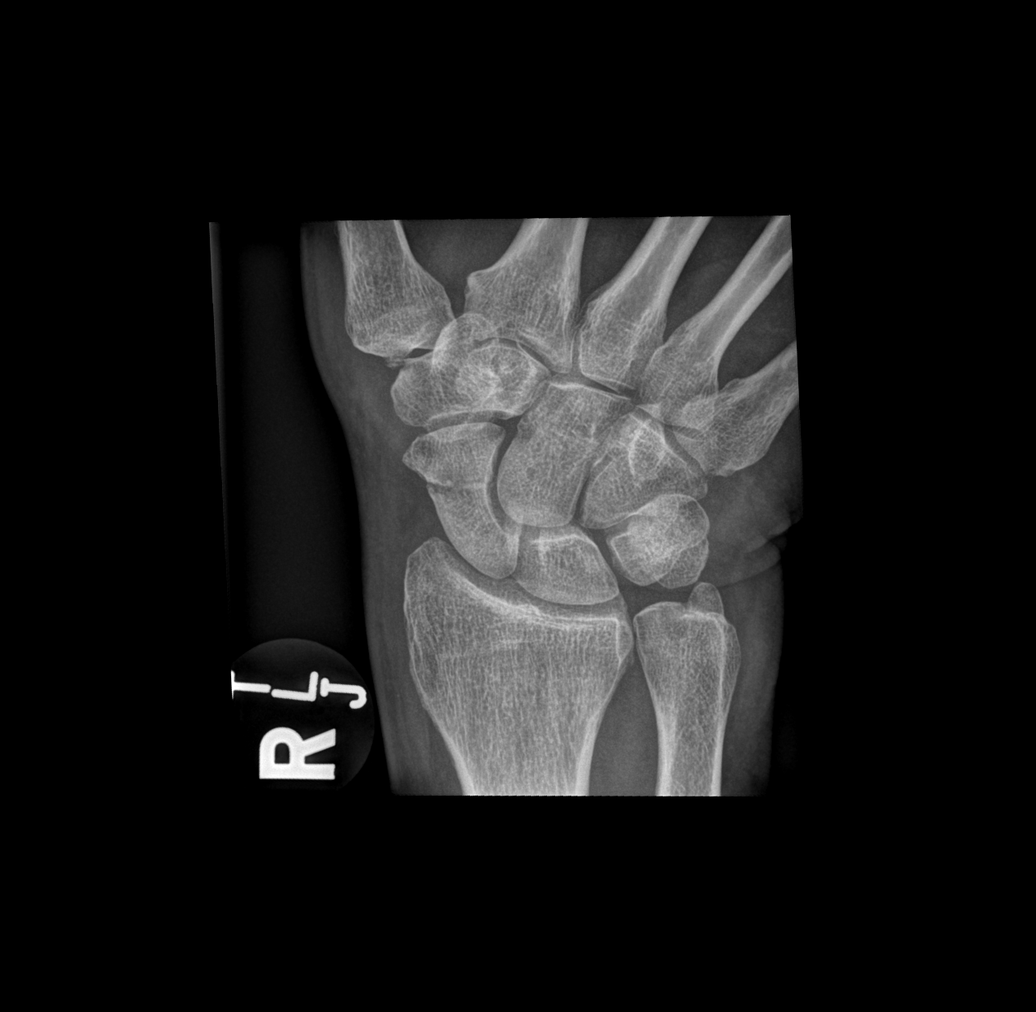

[4 of 4 positions shown; findings below may reference images not displayed]

FINDINGS: Frontal, oblique, lateral, and ulnar deviated views of the right
wrist are obtained. There is a minimally displaced fracture through
the distal aspect of the scaphoid waist. No significant callus
formation. Alignment is near anatomic.

Mild osteoarthritis at the first and second carpometacarpal joints.
Mild diffuse soft tissue swelling.
IMPRESSION: 1. Minimally displaced scaphoid waist fracture. No significant
callus formation.
2. Soft tissue swelling.
3. Osteoarthritis.

These results will be called to the ordering clinician or
representative by the Radiologist Assistant, and communication
documented in the PACS or [REDACTED].

## 2023-06-05 DIAGNOSIS — Z85828 Personal history of other malignant neoplasm of skin: Secondary | ICD-10-CM | POA: Diagnosis not present

## 2023-06-05 DIAGNOSIS — L820 Inflamed seborrheic keratosis: Secondary | ICD-10-CM | POA: Diagnosis not present

## 2023-06-05 DIAGNOSIS — D225 Melanocytic nevi of trunk: Secondary | ICD-10-CM | POA: Diagnosis not present

## 2023-06-05 DIAGNOSIS — L570 Actinic keratosis: Secondary | ICD-10-CM | POA: Diagnosis not present

## 2023-06-05 DIAGNOSIS — B078 Other viral warts: Secondary | ICD-10-CM | POA: Diagnosis not present

## 2023-06-05 DIAGNOSIS — L814 Other melanin hyperpigmentation: Secondary | ICD-10-CM | POA: Diagnosis not present

## 2023-06-05 DIAGNOSIS — Z85820 Personal history of malignant melanoma of skin: Secondary | ICD-10-CM | POA: Diagnosis not present

## 2023-06-05 DIAGNOSIS — D1801 Hemangioma of skin and subcutaneous tissue: Secondary | ICD-10-CM | POA: Diagnosis not present

## 2023-06-05 DIAGNOSIS — L821 Other seborrheic keratosis: Secondary | ICD-10-CM | POA: Diagnosis not present

## 2023-06-17 IMAGING — CT CT WRIST*R* W/O CM
3 of 6 series · 9 of 33 positions shown, 10 images · non-contrast
Comparison: Right wrist radiographs 12/25/2021

CLINICAL DATA: Wrist fracture. Evaluate subacute scaphoid fracture
(approximally 1-month-old injury) for alignment and healing. Fell
1.5 months ago playing pickle ball and caught himself with right
hand/wrist. Closed nondisplaced fracture of middle third of scaphoid
bone.

EXAM:
CT OF THE RIGHT WRIST WITHOUT CONTRAST
TECHNIQUE: Multidetector CT imaging of the right wrist was performed according
to the standard protocol. Multiplanar CT image reconstructions were
also generated.
RADIATION DOSE REDUCTION: This exam was performed according to the
departmental dose-optimization program which includes automated
exposure control, adjustment of the mA and/or kV according to
patient size and/or use of iterative reconstruction technique.

[Series 3: wrist 1.50 br60 s3 axial bone hd fov · axial · 0.16mm/px · z∈[-654,-569]mm · 3 of 160 slices shown, 4 images]
[im 27/160  soft-tissue]
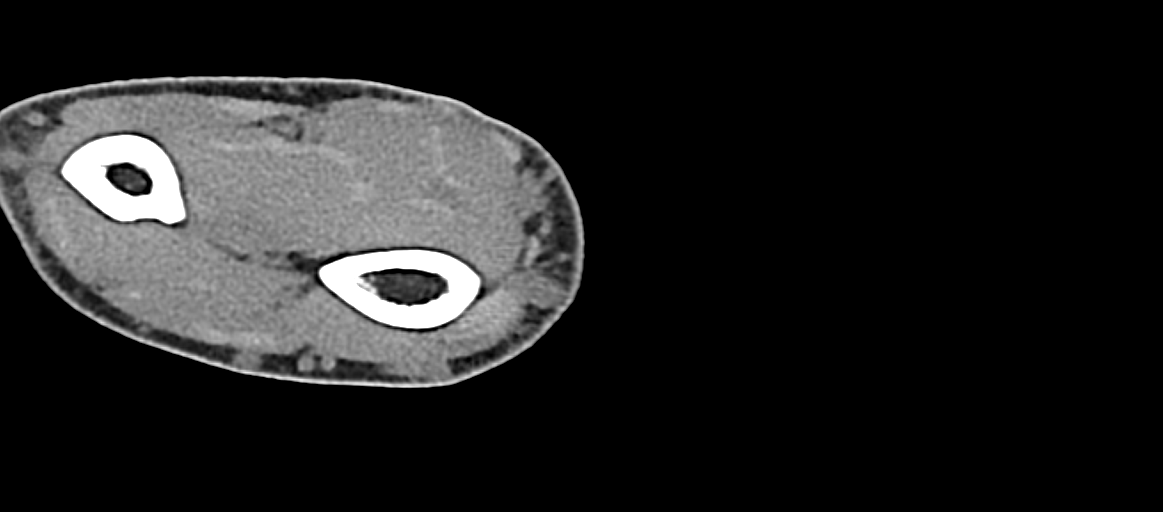
[im 27/160  bone]
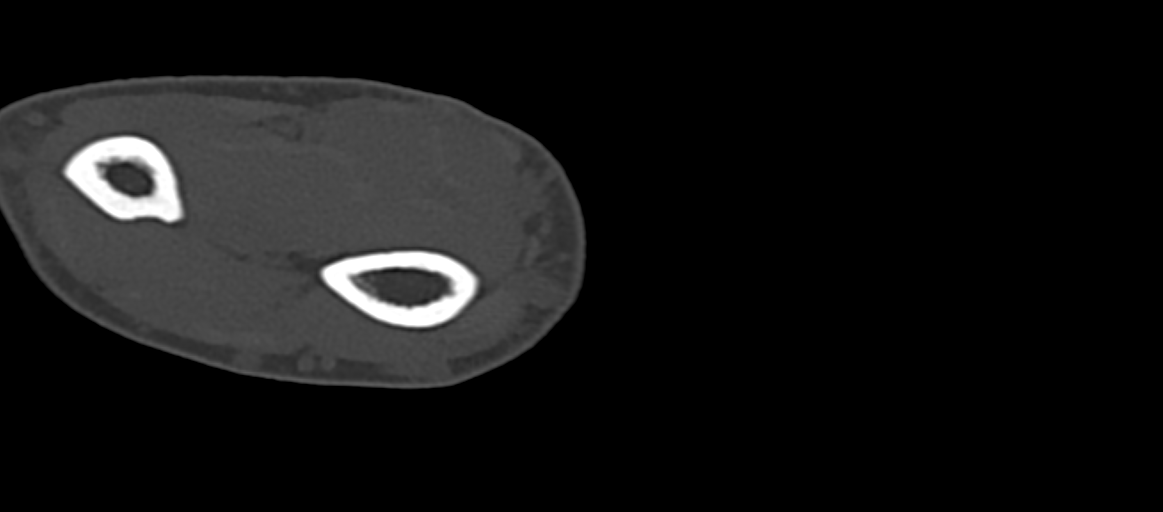
[im 80/160  bone]
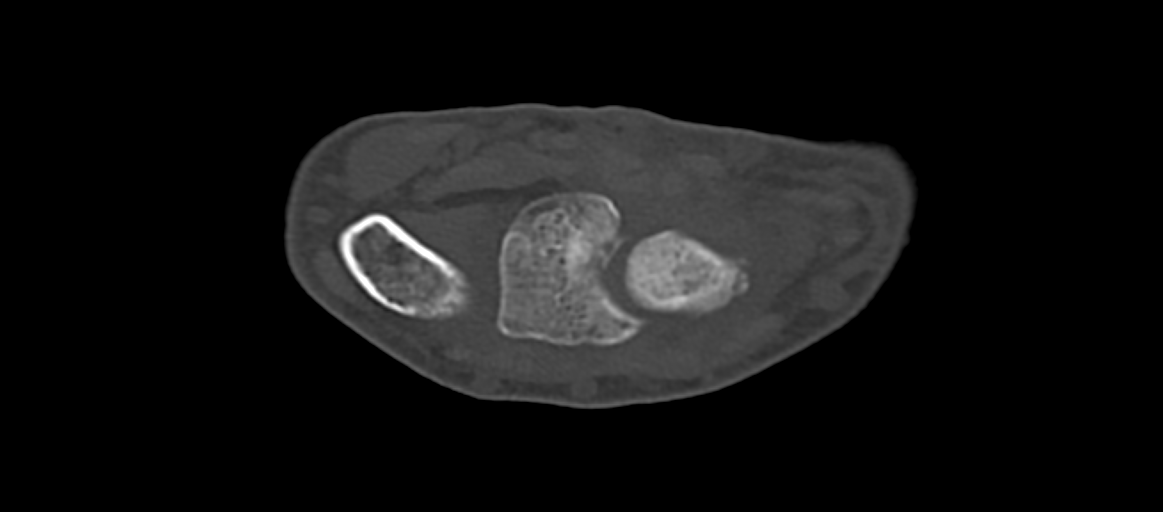
[im 133/160  bone]
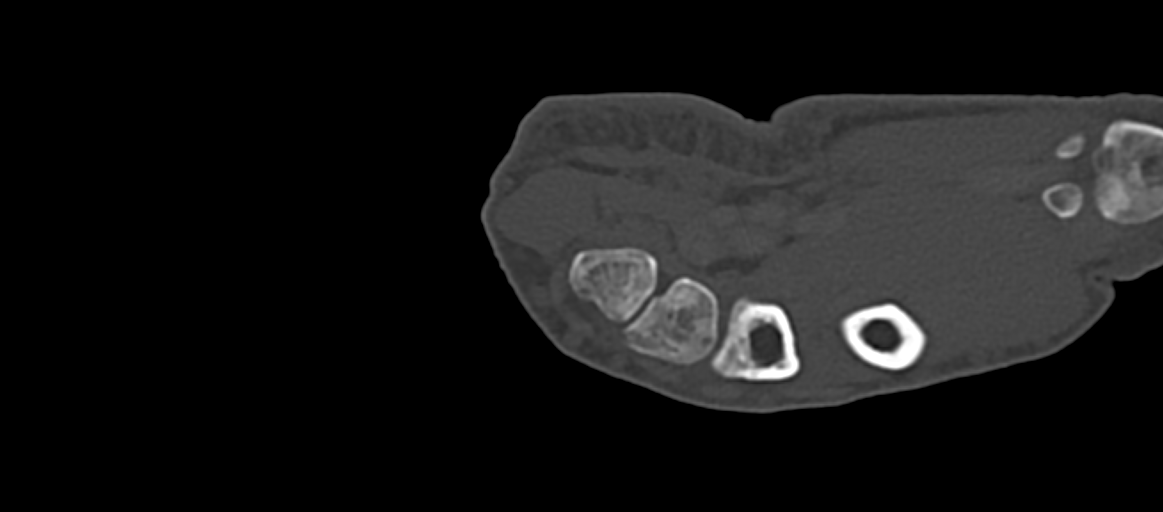

[Series 9: wrist 1.50 br60 s3 cor bone hd fov · coronal · 0.25mm/px · 1 of 186 slices shown]
[im 93/186  bone]
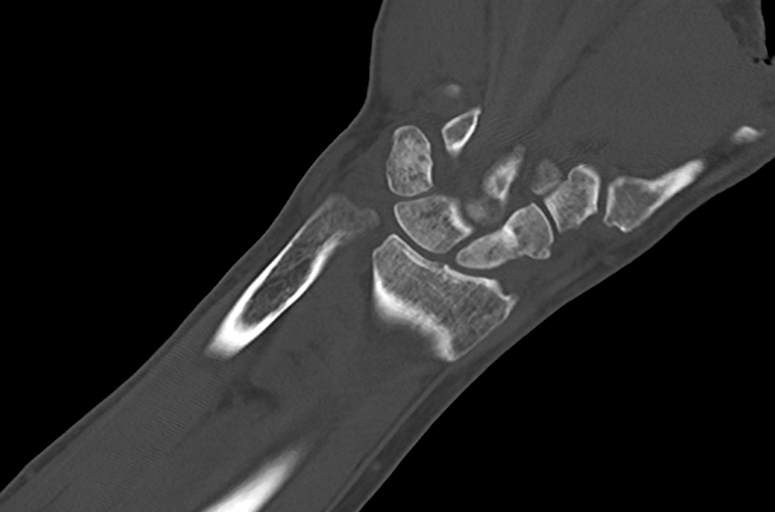

[Series 13: wrist 1.50 br60 s3 sag bone hd fov · sagittal · 0.20mm/px · 5 of 253 slices shown]
[im 43/253  bone]
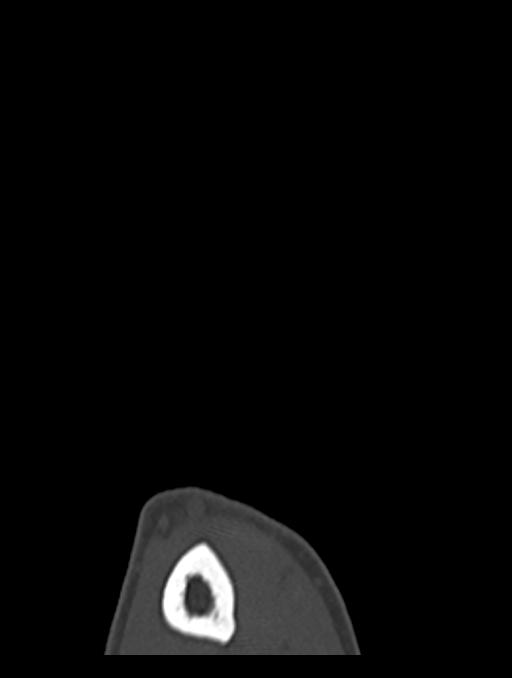
[im 85/253  bone]
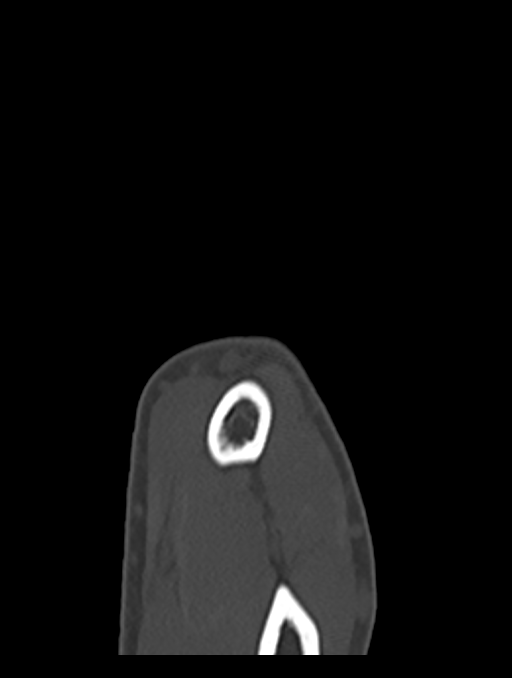
[im 127/253  bone]
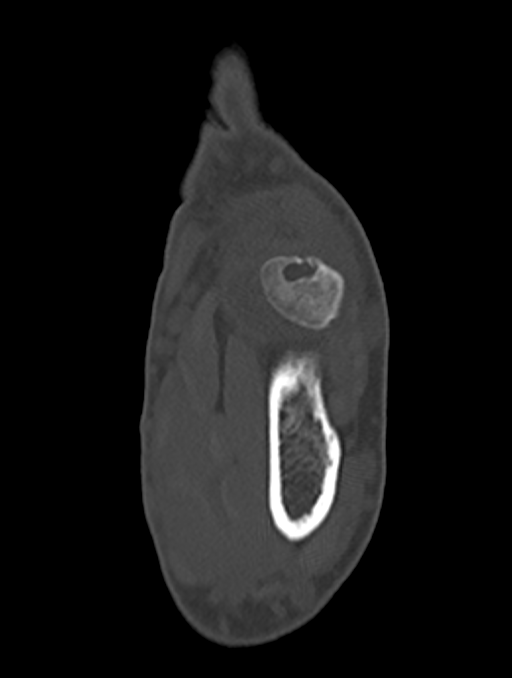
[im 169/253  bone]
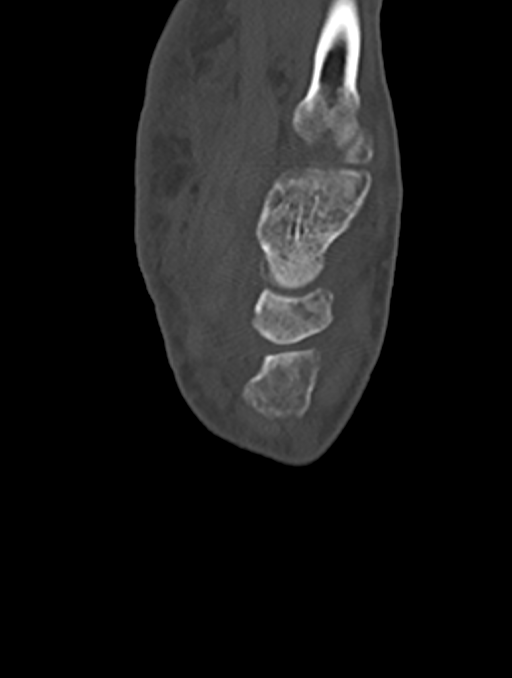
[im 211/253  bone]
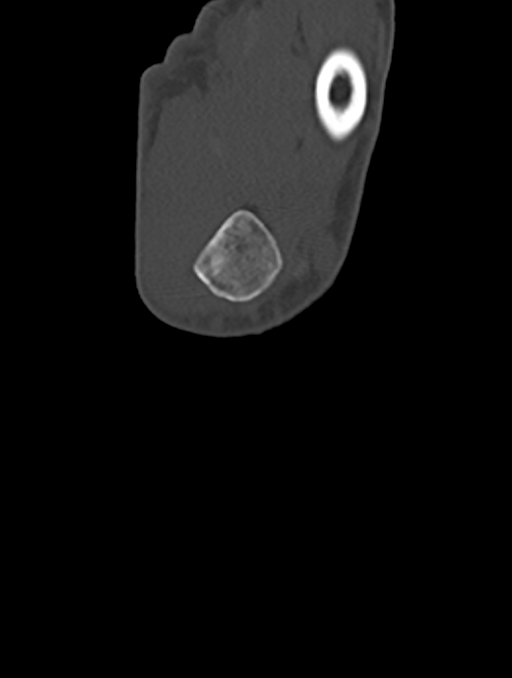

[9 of 33 positions shown; findings below may reference images not displayed]

FINDINGS: Bones/Joint/Cartilage

There is a mildly comminuted, Y shaped (axial series 3, image 74) in
the transverse plane of the scaphoid, lucency within the mid to
distal aspect of the middle third of the scaphoid. Up to 4 mm
diastasis of the mid aspect of the scaphoid (axial image 76, coronal
image 98), however no significant displacement.

Mild triscaphe joint space narrowing and mild distal scaphoid
peripheral degenerative osteophytes.

Moderate thumb carpometacarpal joint space narrowing with multiple
tiny ossicles seen at the medial and lateral aspect of the joint.
These appear well corticated and are favored to be chronic. One of
these ossicles, just lateral to the distal aspect of the trapezium,
has more age indeterminate margins and may be chronic or subacute
(coronal image 82), measuring up to 2 mm.

There is lucency within the peripheral aspect of the distal portion
of the hamate that is partially curvilinear and may represent a
nondisplaced subacute fracture (coronal series 9 images 98 through
105).

Ligaments

Suboptimally assessed by CT.

Muscles and Tendons

Normal density and size of the regional musculature. No gross tendon
tear is seen.

Soft tissues

No fluid collection.  No significant swelling.
IMPRESSION: Mildly comminuted subacute fracture of the mid scaphoid. Up to 4 mm
diastasis but no significant displacement.

There is a lucency at the distal lateral aspect of the hamate. This
may represent a subacute nondisplaced healing fracture.

Mild triscaphe and moderate thumb carpometacarpal osteoarthritis.
There are multiple tiny ossicles at the medial and lateral aspect of
the thumb carpometacarpal joint. One of these at the lateral aspect
may be subacute.

## 2023-07-04 ENCOUNTER — Other Ambulatory Visit: Payer: Medicare Other

## 2023-07-04 DIAGNOSIS — E782 Mixed hyperlipidemia: Secondary | ICD-10-CM | POA: Diagnosis not present

## 2023-07-04 DIAGNOSIS — I10 Essential (primary) hypertension: Secondary | ICD-10-CM | POA: Diagnosis not present

## 2023-07-09 ENCOUNTER — Encounter: Payer: Medicare Other | Admitting: Internal Medicine

## 2023-07-10 ENCOUNTER — Encounter: Payer: Self-pay | Admitting: Internal Medicine

## 2023-07-10 ENCOUNTER — Non-Acute Institutional Stay: Payer: Medicare Other | Admitting: Internal Medicine

## 2023-07-10 VITALS — BP 132/78 | HR 63 | Temp 97.7°F | Resp 17 | Ht 73.0 in | Wt 187.7 lb

## 2023-07-10 DIAGNOSIS — L720 Epidermal cyst: Secondary | ICD-10-CM

## 2023-07-10 DIAGNOSIS — I10 Essential (primary) hypertension: Secondary | ICD-10-CM | POA: Diagnosis not present

## 2023-07-10 DIAGNOSIS — E782 Mixed hyperlipidemia: Secondary | ICD-10-CM

## 2023-07-10 DIAGNOSIS — M542 Cervicalgia: Secondary | ICD-10-CM | POA: Diagnosis not present

## 2023-07-10 DIAGNOSIS — R0789 Other chest pain: Secondary | ICD-10-CM

## 2023-07-10 NOTE — Progress Notes (Signed)
[facility] (12)  Provider:   Code Status: DNR Goals of Care:     07/10/2023    9:22 AM  Advanced Directives  Does Patient Have a Medical Advance Directive? Yes  Type of Estate agent of [place];Living will;Out of facility DNR (pink MOST or yellow form)  Does patient want to make changes to medical advance directive? No - Patient declined  Copy of Healthcare Power of Attorney in Chart? No - copy requested     Chief Complaint  Patient presents with   Medical Management of Chronic Issues    Patient is here for a 6 month follow up and discuss labs    Immunizations    Discuss the need for flu and covid vaccine     HPI: Patient is a 77 y.o. male seen today for medical management of chronic diseases.    [place] in [facility]   Has h/o HTN,HLD DJD in his Neck   Chest Discomfort Started feeling again recently when he was working for [employer] No SOB or diaphoresis Lasted for few min and then got better He had refused Cardiology Eval before but now more open  Lesion on his left ear Gustavus Messing Thinks it was Bug bute  Cervicalgia with Riding Bike  Did bike to [employer] and came back next day   Past Medical History:  Diagnosis Date   Arthritis    neck   Cataract    Cervicalgia 03/29/2015   History of colonic polyps    Hyperlipidemia    Hypertension    Thrombocytopenia ([facility])    Vitamin D deficiency     Past Surgical History:  Procedure Laterality Date   ARM SKIN LESION BIOPSY / EXCISION Left 02/2016   atypical lentigmous and nested melanocytic proliferation--Dr. Aris Lot   CATARACT EXTRACTION Bilateral january and february 2018   ORIF SCAPHOID FRACTURE Right 01/24/2022   Procedure: RIGHT OPEN REDUCTION INTERNAL FIXATION (ORIF) SCAPHOID FRACTURE;  Surgeon: Marlyne Beards, MD;  Location: [facility];  Service: Orthopedics;  Laterality: Right;    Allergies  Allergen Reactions    Zocor [Simvastatin] Other (See Comments)    Memory loss   Niaspan [Niacin Er] Hives    Outpatient Encounter Medications as of 07/10/2023  Medication Sig   aspirin EC 81 MG tablet Take 81 mg by mouth daily.   Cholecalciferol 50 MCG (2000 UT) TABS Take 2,000 Units by mouth daily.   fenofibrate 160 MG tablet TAKE 1 TABLET BY MOUTH  DAILY   hydrochlorothiazide (HYDRODIURIL) 25 MG tablet TAKE 1 TABLET BY MOUTH  DAILY   lisinopril (ZESTRIL) 40 MG tablet TAKE 1 TABLET BY MOUTH  DAILY   No facility-administered encounter medications on file as of 07/10/2023.    Review of Systems:  Review of Systems  Constitutional:  Negative for activity change, appetite change and unexpected weight change.  HENT: Negative.    Respiratory:  Negative for cough and shortness of breath.   Cardiovascular:  Negative for leg swelling.  Gastrointestinal:  Negative for constipation.  Genitourinary:  Negative for frequency.  Musculoskeletal:  Negative for arthralgias, gait problem and myalgias.  Skin: Negative.  Negative for rash.  Neurological:  Negative for dizziness and weakness.  Psychiatric/Behavioral:  Negative for confusion and sleep disturbance.   All other systems reviewed and are negative.   Health Maintenance  Topic Date Due   INFLUENZA VACCINE  05/30/2023   COVID-19 Vaccine (7 -  2023-24 season) 06/30/2023   Medicare Annual Wellness (AWV)  12/25/2023   Colonoscopy  06/24/2024   DTaP/Tdap/Td (3 - Td or Tdap) 05/27/2028   Pneumonia Vaccine 25+ Years old  Completed   Hepatitis C Screening  Completed   Zoster Vaccines- Shingrix  Completed   HPV VACCINES  Aged Out    Physical Exam: Vitals:   07/10/23 0919  BP: 132/78  Pulse: 63  Resp: 17  Temp: 97.7 F (36.5 C)  TempSrc: Temporal  SpO2: 97%  Weight: 187 lb 11.2 oz (85.1 kg)  Height: 6\' 1"  (1.854 m)   Body mass index is 24.76 kg/m. Physical Exam Vitals reviewed.  Constitutional:      Appearance: Normal appearance.  HENT:     Head:  Normocephalic.     Ears:     Comments: Left Pinna a small Cyst but not tender or red      Nose: Nose normal.     Mouth/Throat:     Mouth: Mucous membranes are moist.     Pharynx: Oropharynx is clear.  Eyes:     Pupils: Pupils are equal, round, and reactive to light.  Cardiovascular:     Rate and Rhythm: Normal rate and regular rhythm.     Pulses: Normal pulses.     Heart sounds: No murmur heard. Pulmonary:     Effort: Pulmonary effort is normal. No respiratory distress.     Breath sounds: Normal breath sounds. No rales.  Abdominal:     General: Abdomen is flat. Bowel sounds are normal.     Palpations: Abdomen is soft.  Musculoskeletal:        General: No swelling.     Cervical back: Neck supple.     Comments: Chronic venous changes bilateral  Skin:    General: Skin is warm.  Neurological:     General: No focal deficit present.     Mental Status: He is alert and oriented to person, place, and time.  Psychiatric:        Mood and Affect: Mood normal.        Thought Content: Thought content normal.     Labs reviewed: Basic Metabolic Panel: Recent Labs    12/31/22 0816 07/04/23 0800  NA 143 144  K 3.9 3.7  CL 106 107  CO2 26 26  GLUCOSE 77 69  BUN 32* 26*  CREATININE 1.15 1.02  CALCIUM 9.4 8.8  TSH 0.96 1.17   Liver Function Tests: Recent Labs    12/31/22 0816 07/04/23 0800  AST 19 20  ALT 18 18  BILITOT 0.7 0.5  PROT 6.2 6.3   No results for input(s): "LIPASE", "AMYLASE" in the last 8760 hours. No results for input(s): "AMMONIA" in the last 8760 hours. CBC: Recent Labs    12/31/22 0816 07/04/23 0800  WBC 5.7 5.6  NEUTROABS 3,825 3,970  HGB 15.3 14.6  HCT 44.2 42.9  MCV 87.0 89.2  PLT 191 141   Lipid Panel: Recent Labs    12/31/22 0816 07/04/23 0800  CHOL 145 134  HDL 50 53  LDLCALC 82 69  TRIG 54 42  CHOLHDL 2.9 2.5   No results found for: "HGBA1C"  Procedures since last visit: No results found.  Assessment/Plan 1. Essential  hypertension BP controlled on hydrochlorothiazide and Lisinopril  2. Mixed hyperlipidemia LDL good levels Has been on Fenofibrate for many years He thinks Statin caused him to have memory issues I will not change it now ut can discontinue it after his Cardiology  appointment  3. Cervicalgia Can use PRN Tylenol and Voltaren ointment  4. Chest discomfort  - Ambulatory referral to Cardiology  5. Epidermoid cyst of ear Will monitor   Labs/tests ordered:   Next appt:  Visit date not found

## 2023-07-15 ENCOUNTER — Other Ambulatory Visit: Payer: Self-pay | Admitting: Internal Medicine

## 2023-07-16 ENCOUNTER — Encounter: Payer: Medicare Other | Admitting: Internal Medicine

## 2023-07-17 ENCOUNTER — Encounter: Payer: Medicare Other | Admitting: Internal Medicine

## 2023-08-27 DIAGNOSIS — Z23 Encounter for immunization: Secondary | ICD-10-CM | POA: Diagnosis not present

## 2023-09-03 NOTE — Progress Notes (Unsigned)
Cardiology Office Note:  .   Date:  09/05/2023  ID:  Brandon [name], DOB [birth year], MRN [id number] PCP: Mahlon Gammon, MD  [facility] Providers Cardiologist:  None    History of Present Illness: .   Brandon [name] is a 77 y.o. male with history of HTN, HLD who presents for the evaluation of chest pain at the request of Mahlon Gammon, MD.  [facility] living @ [facility]   History of Present Illness   [name], a 77 year old male with a history of hypertension and hyperlipidemia, presents with intermittent chest tightness. The episodes of chest discomfort are infrequent and random, with no clear pattern or trigger. The discomfort is described as a feeling of tightness, which resolves within seconds upon cessation of activity. The patient reports no daily occurrence of these symptoms. They do occur with activity.   In 2003, the patient underwent a heart catheterization due to similar symptoms. The procedure revealed some plaque but no significant blockage. The patient has been physically active throughout his life, engaging in regular swimming and biking activities. He reports no significant changes in his activity level that could account for the recent episodes of chest discomfort.  The patient has been on medication for hypertension and cholesterol for over ten years. He is currently taking fenofibrate for cholesterol and has had issues with statins in the past. His blood pressure is well controlled on his current regimen.           Problem List HTN HLD -T chol 134, HDL 53, LDL 69, TG 42 3. Non-obstructive CAD -LHC 2003 ([place]) ->  50% pLAD, 20% RCA, 20% LCX    ROS: All other ROS reviewed and negative. Pertinent positives noted in the HPI.     Studies Reviewed: Marland Kitchen   EKG Interpretation Date/Time:  Thursday September 05 2023 08:40:41 EST Ventricular Rate:  69 PR Interval:  156 QRS Duration:  90 QT Interval:  390 QTC Calculation: 417 R Axis:   61  Text  Interpretation: Normal sinus rhythm Normal ECG Confirmed by Lennie Odor [phone]) on 09/05/2023 8:41:45 AM   Physical Exam:   VS:  BP 124/70 (BP Location: Left Arm, Patient Position: Sitting, Cuff Size: Normal)   Ht 6' 1.5" (1.867 m)   Wt 187 lb 6.4 oz (85 kg)   SpO2 94%   BMI 24.39 kg/m    Wt Readings from Last 3 Encounters:  09/05/23 187 lb 6.4 oz (85 kg)  07/10/23 187 lb 11.2 oz (85.1 kg)  01/02/23 191 lb 11.2 oz (87 kg)    GEN: Well nourished, well developed in no acute distress NECK: No JVD; No carotid bruits CARDIAC: RRR, no murmurs, rubs, gallops RESPIRATORY:  Clear to auscultation without rales, wheezing or rhonchi  ABDOMEN: Soft, non-tender, non-distended EXTREMITIES:  No edema; No deformity  ASSESSMENT AND PLAN: .   Assessment and Plan    Chest Pain CAD with possible angina Infrequent episodes of chest tightness, often with activity. History of 50% proximal LAD lesion in 2003. EKG normal. -Order Coronary CT Angiography to evaluate current coronary anatomy. -Obtain BMP today to ensure kidney function is adequate for contrast. -Instruct patient to take Metoprolol Tartrate 100mg  two hours before the scan to slow heart rate.  Hyperlipidemia LDL cholesterol is 69 on Fenofibrate. History of memory issues with Simvastatin. -Continue Fenofibrate.  Hypertension Well controlled on current regimen. -Continue current antihypertensive regimen.  Follow-up Based on results of the Coronary CT Angiography. Further testing may be needed depending on  coronary anatomy.              Follow-up: Return if symptoms worsen or fail to improve.   Signed, Lenna Gilford. Flora Lipps, MD, [facility]  [facility]  [address] [place], [place] [address] [phone]  9:32 AM

## 2023-09-05 ENCOUNTER — Encounter: Payer: Self-pay | Admitting: Cardiovascular Disease

## 2023-09-05 ENCOUNTER — Ambulatory Visit: Payer: Medicare Other | Attending: Cardiovascular Disease | Admitting: Cardiovascular Disease

## 2023-09-05 VITALS — BP 124/70 | Ht 73.5 in | Wt 187.4 lb

## 2023-09-05 DIAGNOSIS — I150 Renovascular hypertension: Secondary | ICD-10-CM | POA: Insufficient documentation

## 2023-09-05 DIAGNOSIS — I2510 Atherosclerotic heart disease of native coronary artery without angina pectoris: Secondary | ICD-10-CM | POA: Diagnosis not present

## 2023-09-05 DIAGNOSIS — R072 Precordial pain: Secondary | ICD-10-CM | POA: Diagnosis not present

## 2023-09-05 DIAGNOSIS — Z01812 Encounter for preprocedural laboratory examination: Secondary | ICD-10-CM | POA: Diagnosis not present

## 2023-09-05 DIAGNOSIS — R079 Chest pain, unspecified: Secondary | ICD-10-CM | POA: Diagnosis not present

## 2023-09-05 DIAGNOSIS — E782 Mixed hyperlipidemia: Secondary | ICD-10-CM | POA: Insufficient documentation

## 2023-09-05 MED ORDER — METOPROLOL TARTRATE 100 MG PO TABS: 100.0000 mg | ORAL_TABLET | Freq: Once | ORAL | 0 refills | Status: DC

## 2023-09-05 NOTE — Patient Instructions (Signed)
Medication Instructions:  - Take METOPROLOL 100MG  (1 tablet) 2 hours before cardiac testing.    *If you need a refill on your cardiac medications before your next appointment, please call your pharmacy*   Lab Work: - BMET   If you have labs (blood work) drawn today and your tests are completely normal, you will receive your results only by: MyChart Message (if you have MyChart) OR A paper copy in the mail If you have any lab test that is abnormal or we need to change your treatment, we will call you to review the results.   Testing/Procedures:   Your cardiac CT will be scheduled at one of the below locations:   [facility] [address] [place], [place] [address] [phone]   If scheduled at [facility], please arrive at the [facility] and Children's Entrance (Entrance C2) of [facility] 30 minutes prior to test start time. You can use the FREE valet parking offered at entrance C (encouraged to control the heart rate for the test)  Proceed to the [facility] Radiology Department (first floor) to check-in and test prep.  All radiology patients and guests should use entrance C2 at [facility], accessed from [facility], even though the hospital's physical address listed is [address]     There is spacious parking and easy access to the radiology department from the [facility] V[facility] entrance. Please enter here and check-in with the desk attendant.   Please follow these instructions carefully (unless otherwise directed):  An IV will be required for this test and Nitroglycerin will be given.  Hold all erectile dysfunction medications at least 3 days (72 hrs) prior to test. (Ie viagra, cialis, sildenafil, tadalafil, etc)   On the Night Before the Test: Be sure to Drink plenty of water. Do not consume any caffeinated/decaffeinated beverages or chocolate 12 hours prior to your test. Do not take any  antihistamines 12 hours prior to your test. If the patient has contrast allergy: Patient will need a prescription for Prednisone and very clear instructions (as follows): Prednisone 50 mg - take 13 hours prior to test Take another Prednisone 50 mg 7 hours prior to test Take another Prednisone 50 mg 1 hour prior to test Take Benadryl 50 mg 1 hour prior to test Patient must complete all four doses of above prophylactic medications. Patient will need a ride after test due to Benadryl.  On the Day of the Test: Drink plenty of water until 1 hour prior to the test. Do not eat any food 1 hour prior to test. You may take your regular medications prior to the test.  Take metoprolol (Lopressor) 100MG   two hours prior to test. If you take Furosemide/Hydrochlorothiazide/Spironolactone, please HOLD on the morning of the test. HOLD HYDROCHLOROTHIAZIDE        After the Test: Drink plenty of water. After receiving IV contrast, you may experience a mild flushed feeling. This is normal. On occasion, you may experience a mild rash up to 24 hours after the test. This is not dangerous. If this occurs, you can take Benadryl 25 mg and increase your fluid intake. If you experience trouble breathing, this can be serious. If it is severe call 911 IMMEDIATELY. If it is mild, please call our office. If you take any of these medications: Glipizide/Metformin, Avandament, Glucavance, please do not take 48 hours after completing test unless otherwise instructed.  We will call to schedule your test 2-4  weeks out understanding that some insurance companies will need an authorization prior to the service being performed.   For more information and frequently asked questions, please visit our website : [email or web address]  For non-scheduling related questions, please contact the cardiac imaging nurse navigator should you have any questions/concerns: Cardiac Imaging Nurse Navigators Direct Office Dial:  [phone]   For scheduling needs, including cancellations and rescheduling, please call [place], [phone].    Follow-Up: At [facility], you and your health needs are our priority.  As part of our continuing mission to provide you with exceptional heart care, we have created designated Provider Care Teams.  These Care Teams include your primary Cardiologist (physician) and Advanced Practice Providers (APPs -  Physician Assistants and Nurse Practitioners) who all work together to provide you with the care you need, when you need it.  We recommend signing up for the patient portal called "MyChart".  Sign up information is provided on this After Visit Summary.  MyChart is used to connect with patients for Virtual Visits (Telemedicine).  Patients are able to view lab/test results, encounter notes, upcoming appointments, etc.  Non-urgent messages can be sent to your provider as well.   To learn more about what you can do with MyChart, go to [email or web address].    Your next appointment:   Follow up as needed   Provider:   Dr. Velna Ochs, MD   Other Instructions

## 2023-09-06 LAB — BASIC METABOLIC PANEL
BUN/Creatinine Ratio: 16 (ref 10–24)
BUN: 17 mg/dL (ref 8–27)
CO2: 23 mmol/L (ref 20–29)
Calcium: 9.3 mg/dL (ref 8.6–10.2)
Chloride: 104 mmol/L (ref 96–106)
Creatinine, Ser: 1.06 mg/dL (ref 0.76–1.27)
Glucose: 87 mg/dL (ref 70–99)
Potassium: 3.9 mmol/L (ref 3.5–5.2)
Sodium: 142 mmol/L (ref 134–144)
eGFR: 73 mL/min/{1.73_m2} (ref >59)

## 2023-09-23 ENCOUNTER — Encounter (HOSPITAL_COMMUNITY): Payer: Self-pay

## 2023-09-25 ENCOUNTER — Ambulatory Visit (HOSPITAL_BASED_OUTPATIENT_CLINIC_OR_DEPARTMENT_OTHER)
Admission: RE | Admit: 2023-09-25 | Discharge: 2023-09-25 | Disposition: A | Discharge status: Home / Self Care | Payer: Medicare Other | Source: Ambulatory Visit | Attending: Cardiology | Admitting: Cardiology

## 2023-09-25 ENCOUNTER — Ambulatory Visit (HOSPITAL_COMMUNITY)
Admission: RE | Admit: 2023-09-25 | Discharge: 2023-09-25 | Disposition: A | Discharge status: Home / Self Care | Payer: Medicare Other | Source: Ambulatory Visit | Attending: Cardiovascular Disease | Admitting: Cardiovascular Disease

## 2023-09-25 ENCOUNTER — Other Ambulatory Visit: Payer: Self-pay | Admitting: Cardiology

## 2023-09-25 DIAGNOSIS — R931 Abnormal findings on diagnostic imaging of heart and coronary circulation: Secondary | ICD-10-CM

## 2023-09-25 DIAGNOSIS — I2510 Atherosclerotic heart disease of native coronary artery without angina pectoris: Secondary | ICD-10-CM | POA: Diagnosis not present

## 2023-09-25 DIAGNOSIS — R072 Precordial pain: Secondary | ICD-10-CM | POA: Insufficient documentation

## 2023-09-25 DIAGNOSIS — R079 Chest pain, unspecified: Secondary | ICD-10-CM | POA: Diagnosis not present

## 2023-09-25 MED ORDER — IOHEXOL 350 MG/ML SOLN
95.0000 mL | Freq: Once | INTRAVENOUS | Status: AC | PRN
  Administered 2023-09-25: 95 mL via INTRAVENOUS

## 2023-09-25 MED ORDER — NITROGLYCERIN 0.4 MG SL SUBL
0.8000 mg | SUBLINGUAL_TABLET | Freq: Once | SUBLINGUAL | Status: AC
  Administered 2023-09-25: 0.8 mg via SUBLINGUAL

## 2023-09-25 MED ORDER — NITROGLYCERIN 0.4 MG SL SUBL
SUBLINGUAL_TABLET | SUBLINGUAL | Status: AC
  Filled 2023-09-25: qty 2

## 2023-09-29 ENCOUNTER — Encounter: Payer: Self-pay | Admitting: Cardiovascular Disease

## 2023-09-30 ENCOUNTER — Other Ambulatory Visit: Payer: Self-pay

## 2023-09-30 MED ORDER — METOPROLOL TARTRATE 25 MG PO TABS: 25.0000 mg | ORAL_TABLET | Freq: Two times a day (BID) | ORAL | 3 refills | Status: DC

## 2023-09-30 MED ORDER — EZETIMIBE 10 MG PO TABS: 10.0000 mg | ORAL_TABLET | Freq: Every day | ORAL | 3 refills | Status: DC

## 2023-09-30 NOTE — Progress Notes (Signed)
Patient identification verified by 2 forms. Shade Flood, RN     Called and spoke to patient and made him aware of new medications and CTA test results. Patient confirmed [facility] in [place] for pharmacy. Will call back next year to provided new pharmacy bc switching to [facility].   Patient agrees with plan, no questions at this time

## 2023-10-03 ENCOUNTER — Other Ambulatory Visit: Payer: Self-pay

## 2023-10-03 MED ORDER — HYDROCHLOROTHIAZIDE 25 MG PO TABS: 25.0000 mg | ORAL_TABLET | Freq: Every day | ORAL | 3 refills | Status: DC

## 2023-10-03 MED ORDER — LISINOPRIL 40 MG PO TABS: 40.0000 mg | ORAL_TABLET | Freq: Every day | ORAL | 3 refills | Status: DC

## 2023-10-03 MED ORDER — FENOFIBRATE 160 MG PO TABS: 160.0000 mg | ORAL_TABLET | Freq: Every day | ORAL | 3 refills | Status: DC

## 2023-10-10 ENCOUNTER — Other Ambulatory Visit: Payer: Self-pay | Admitting: Cardiovascular Disease

## 2023-10-10 MED ORDER — METOPROLOL TARTRATE 25 MG PO TABS: 25.0000 mg | ORAL_TABLET | Freq: Two times a day (BID) | ORAL | 3 refills | Status: DC

## 2023-12-02 DIAGNOSIS — H401411 Capsular glaucoma with pseudoexfoliation of lens, right eye, mild stage: Secondary | ICD-10-CM | POA: Diagnosis not present

## 2023-12-13 DIAGNOSIS — D1801 Hemangioma of skin and subcutaneous tissue: Secondary | ICD-10-CM | POA: Diagnosis not present

## 2023-12-13 DIAGNOSIS — L814 Other melanin hyperpigmentation: Secondary | ICD-10-CM | POA: Diagnosis not present

## 2023-12-13 DIAGNOSIS — L821 Other seborrheic keratosis: Secondary | ICD-10-CM | POA: Diagnosis not present

## 2023-12-13 DIAGNOSIS — L570 Actinic keratosis: Secondary | ICD-10-CM | POA: Diagnosis not present

## 2023-12-13 DIAGNOSIS — Z85828 Personal history of other malignant neoplasm of skin: Secondary | ICD-10-CM | POA: Diagnosis not present

## 2023-12-13 DIAGNOSIS — D485 Neoplasm of uncertain behavior of skin: Secondary | ICD-10-CM | POA: Diagnosis not present

## 2023-12-13 DIAGNOSIS — Z85820 Personal history of malignant melanoma of skin: Secondary | ICD-10-CM | POA: Diagnosis not present

## 2023-12-23 ENCOUNTER — Other Ambulatory Visit: Payer: Self-pay

## 2023-12-23 MED ORDER — EZETIMIBE 10 MG PO TABS: 10.0000 mg | ORAL_TABLET | Freq: Every day | ORAL | 2 refills | Status: DC

## 2023-12-29 NOTE — Progress Notes (Unsigned)
 Cardiology Office Note:  .   Date:  01/01/2024  ID:  Brandon [name], DOB June 04, 1946, MRN [id number] PCP: Mahlon Gammon, MD  [facility] Providers Cardiologist:  None { History of Present Illness: .    Chief Complaint  Patient presents with   Follow-up    Brandon [name] is a 78 y.o. male with history of CAD who presents for follow-up.    History of Present Illness   Brandon [name] "Brandon [name]" is a 78 year old male with coronary artery disease who presents for follow-up.  He is here for a routine follow-up regarding his coronary artery disease (CAD). His most recent coronary CTA showed nonobstructive CAD. He manages his condition with medications, including fenofibrate, Zetia, and metoprolol. He is unable to take statins due to a previous reaction, so he is on Zetia to help lower his LDL cholesterol. He also takes hydrochlorothiazide and lisinopril for blood pressure management, which he has been on for about ten years. Stopping hydrochlorothiazide in the past led to an increase in blood pressure.  No chest pain, pressure, or tightness recently. He recalls a couple of episodes of feeling 'wobbly' or lightheaded, particularly when standing up or preparing to get on a bicycle, but these were not associated with chest pain. He maintains an active lifestyle, swimming approximately 40 laps in 40 minutes each morning without any chest discomfort. He has a history of chest tightness from 15-20 years ago, but not recently.  His family history includes his mother having cholesterol, and his father having blood clots in his legs. Both parents were smokers. He is focused on prevention through medication and lifestyle. He admits to having a 'problem' with ice cream consumption but is otherwise trying to maintain a healthy diet with lean meats and fresh produce.          Problem List  CAD -CAC 683 (58th percentile) -TPV 597 (severe) -moderate 50-69% pLAD (CT FFR 0.80) -moderate LCX 50-69% (CT  FFR 0.85) HTN HLD -T chol 134, HDL 53, LDL 69, TG 42     ROS: All other ROS reviewed and negative. Pertinent positives noted in the HPI.     Studies Reviewed: Marland Kitchen        CCTA 09/25/2023 IMPRESSION: 1. Coronary calcium score of 683. This was 50 percentile for age and sex matched control.   2. Total plaque volume (TPV) 597 mm3 which is 44th percentile for age-and sex matched controls (calcified plaque 180 mm3; non-calcified plaque 417 mm3). TPV is severe.   3. Normal coronary origin with right dominance.   4. Moderate CAD (proximal LAD FFR 0.80 borderline flow limiting), Prox Circ (0.85 FFR, non flow limiting)   CAD-RADS 3. Moderate stenosis. Consider symptom-guided anti-ischemic pharmacotherapy as well as risk factor modification per guideline directed care. Additional analysis with CT FFR performed Physical Exam:   VS:  BP 120/68 (BP Location: Left Arm, Patient Position: Sitting, Cuff Size: Normal)   Pulse 60   Ht 6' 1.5" (1.867 m)   Wt 190 lb 6.4 oz (86.4 kg)   SpO2 94%   BMI 24.78 kg/m    Wt Readings from Last 3 Encounters:  01/01/24 190 lb 6.4 oz (86.4 kg)  09/05/23 187 lb 6.4 oz (85 kg)  07/10/23 187 lb 11.2 oz (85.1 kg)    GEN: Well nourished, well developed in no acute distress NECK: No JVD; No carotid bruits CARDIAC: RRR, no murmurs, rubs, gallops RESPIRATORY:  Clear to auscultation without rales, wheezing or rhonchi  ABDOMEN: Soft,  non-tender, non-distended EXTREMITIES:  No edema; No deformity  ASSESSMENT AND PLAN: .   Assessment and Plan    Coronary Artery Disease (CAD) Nonobstructive managed with cholesterol control. Asymptomatic for angina. Statin intolerance addressed with Zetia, fenofibrate, and metoprolol. Echocardiogram planned due to family history of cardiovascular issues. - Continue aspirin 81 mg daily. - Continue Zetia 10 mg daily. - Continue fenofibrate. - Continue metoprolol tartrate 25 mg BID. - Goal LDL < 55 mg/dL - Order echocardiogram  to assess heart function. - Forward cholesterol results from primary care physician. - Annual follow-up or sooner if symptoms change.  Hyperlipidemia Managed with fenofibrate and Zetia due to statin intolerance. Goal to lower LDL cholesterol. - Continue fenofibrate. - Continue Zetia 10 mg daily. - Recheck lipids with primary care physician.  Hypertension Controlled with lisinopril and hydrochlorothiazide. Previous blood pressure increase when hydrochlorothiazide was discontinued. - Continue lisinopril 40 mg daily. - Continue hydrochlorothiazide 25 mg daily.              Follow-up: Return in about 1 year (around 12/31/2024).   Signed, Lenna Gilford. Flora Lipps, MD, [facility]  [facility]  [address], [address] [place], [place] [address] [phone]  7:09 PM

## 2024-01-01 ENCOUNTER — Encounter: Payer: Self-pay | Admitting: Cardiovascular Disease

## 2024-01-01 ENCOUNTER — Ambulatory Visit: Payer: Medicare Other | Attending: Cardiovascular Disease | Admitting: Cardiovascular Disease

## 2024-01-01 VITALS — BP 120/68 | HR 60 | Ht 73.5 in | Wt 190.4 lb

## 2024-01-01 DIAGNOSIS — E782 Mixed hyperlipidemia: Secondary | ICD-10-CM | POA: Diagnosis not present

## 2024-01-01 DIAGNOSIS — I150 Renovascular hypertension: Secondary | ICD-10-CM | POA: Diagnosis not present

## 2024-01-01 DIAGNOSIS — I2510 Atherosclerotic heart disease of native coronary artery without angina pectoris: Secondary | ICD-10-CM | POA: Diagnosis not present

## 2024-01-01 NOTE — Patient Instructions (Signed)
 Medication Instructions:  Your physician recommends that you continue on your current medications as directed. Please refer to the Current Medication list given to you today.    *If you need a refill on your cardiac medications before your next appointment, please call your pharmacy*   Lab Work: NONE    If you have labs (blood work) drawn today and your tests are completely normal, you will receive your results only by: MyChart Message (if you have MyChart) OR A paper copy in the mail If you have any lab test that is abnormal or we need to change your treatment, we will call you to review the results.   Testing/Procedures: Echo will be scheduled at [address]  Your physician has requested that you have an echocardiogram. Echocardiography is a painless test that uses sound waves to create images of your heart. It provides your doctor with information about the size and shape of your heart and how well your heart's chambers and valves are working. This procedure takes approximately one hour. There are no restrictions for this procedure. Please do NOT wear cologne, perfume, aftershave, or lotions (deodorant is allowed). Please arrive 15 minutes prior to your appointment time.    Follow-Up: At [facility], you and your health needs are our priority.  As part of our continuing mission to provide you with exceptional heart care, we have created designated Provider Care Teams.  These Care Teams include your primary Cardiologist (physician) and Advanced Practice Providers (APPs -  Physician Assistants and Nurse Practitioners) who all work together to provide you with the care you need, when you need it.  We recommend signing up for the patient portal called "MyChart".  Sign up information is provided on this After Visit Summary.  MyChart is used to connect with patients for Virtual Visits (Telemedicine).  Patients are able to view lab/test results, encounter notes, upcoming  appointments, etc.  Non-urgent messages can be sent to your provider as well.   To learn more about what you can do with MyChart, go to [email or web address].    Your next appointment:   1 year(s)  The format for your next appointment:   In Person  Provider:   Edd Fabian, FNP, Micah Flesher, PA-C, Marjie Skiff, PA-C, Robet Leu, PA-C, Juanda Crumble, PA-C, Joni Reining, DNP, ANP, Azalee Course, PA-C, Bernadene Person, NP, or Reather Littler, NP     Other Instructions

## 2024-01-02 ENCOUNTER — Other Ambulatory Visit: Payer: Medicare Other

## 2024-01-02 DIAGNOSIS — E782 Mixed hyperlipidemia: Secondary | ICD-10-CM | POA: Diagnosis not present

## 2024-01-02 DIAGNOSIS — I10 Essential (primary) hypertension: Secondary | ICD-10-CM | POA: Diagnosis not present

## 2024-01-02 LAB — COMPLETE METABOLIC PANEL WITH GFR
AG Ratio: 2 (calc) (ref 1.0–2.5)
ALT: 17 U/L (ref 9–46)
AST: 19 U/L (ref 10–35)
Albumin: 4.2 g/dL (ref 3.6–5.1)
Alkaline phosphatase (APISO): 38 U/L (ref 35–144)
BUN/Creatinine Ratio: 24 (calc) — ABNORMAL HIGH (ref 6–22)
BUN: 26 mg/dL — ABNORMAL HIGH (ref 7–25)
CO2: 28 mmol/L (ref 20–32)
Calcium: 9.6 mg/dL (ref 8.6–10.3)
Chloride: 108 mmol/L (ref 98–110)
Creat: 1.08 mg/dL (ref 0.70–1.28)
Globulin: 2.1 g/dL (ref 1.9–3.7)
Glucose, Bld: 89 mg/dL (ref 65–99)
Potassium: 3.9 mmol/L (ref 3.5–5.3)
Sodium: 144 mmol/L (ref 135–146)
Total Bilirubin: 0.6 mg/dL (ref 0.2–1.2)
Total Protein: 6.3 g/dL (ref 6.1–8.1)
eGFR: 71 mL/min/{1.73_m2} (ref >=60)

## 2024-01-02 LAB — CBC WITH DIFFERENTIAL/PLATELET
Absolute Lymphocytes: 1137 {cells}/uL (ref 850–3900)
Absolute Monocytes: 347 {cells}/uL (ref 200–950)
Basophils Absolute: 28 {cells}/uL (ref 0–200)
Basophils Relative: 0.5 %
Eosinophils Absolute: 112 {cells}/uL (ref 15–500)
Eosinophils Relative: 2 %
HCT: 44.8 % (ref 38.5–50.0)
Hemoglobin: 15.1 g/dL (ref 13.2–17.1)
MCH: 30.3 pg (ref 27.0–33.0)
MCHC: 33.7 g/dL (ref 32.0–36.0)
MCV: 90 fL (ref 80.0–100.0)
MPV: 11.9 fL (ref 7.5–12.5)
Monocytes Relative: 6.2 %
Neutro Abs: 3976 {cells}/uL (ref 1500–7800)
Neutrophils Relative %: 71 %
Platelets: 156 10*3/uL (ref 140–400)
RBC: 4.98 10*6/uL (ref 4.20–5.80)
RDW: 12.2 % (ref 11.0–15.0)
Total Lymphocyte: 20.3 %
WBC: 5.6 10*3/uL (ref 3.8–10.8)

## 2024-01-02 LAB — LIPID PANEL
Cholesterol: 126 mg/dL (ref <200)
HDL: 51 mg/dL (ref >=40)
LDL Cholesterol (Calc): 61 mg/dL
Non-HDL Cholesterol (Calc): 75 mg/dL (ref <130)
Total CHOL/HDL Ratio: 2.5 (calc) (ref <5.0)
Triglycerides: 67 mg/dL (ref <150)

## 2024-01-08 ENCOUNTER — Non-Acute Institutional Stay: Payer: Medicare Other | Admitting: Internal Medicine

## 2024-01-08 ENCOUNTER — Encounter: Payer: Self-pay | Admitting: Internal Medicine

## 2024-01-08 VITALS — BP 121/87 | HR 53 | Temp 97.6°F | Resp 18 | Wt 186.1 lb

## 2024-01-08 DIAGNOSIS — I10 Essential (primary) hypertension: Secondary | ICD-10-CM

## 2024-01-08 DIAGNOSIS — M542 Cervicalgia: Secondary | ICD-10-CM | POA: Diagnosis not present

## 2024-01-08 DIAGNOSIS — E782 Mixed hyperlipidemia: Secondary | ICD-10-CM | POA: Diagnosis not present

## 2024-01-08 DIAGNOSIS — I2583 Coronary atherosclerosis due to lipid rich plaque: Secondary | ICD-10-CM

## 2024-01-08 DIAGNOSIS — I2510 Atherosclerotic heart disease of native coronary artery without angina pectoris: Secondary | ICD-10-CM | POA: Diagnosis not present

## 2024-01-08 NOTE — Progress Notes (Signed)
 Location:  [facility] educational needs teacher of Service:  Clinic (12)  Provider:   Code Status:  Goals of Care:     07/10/2023    9:22 AM  Advanced Directives  Does Patient Have a Medical Advance Directive? Yes  Type of Estate agent of [place];Living will;Out of facility DNR (pink MOST or yellow form)  Does patient want to make changes to medical advance directive? No - Patient declined  Copy of Healthcare Power of Attorney in Chart? No - copy requested     Chief Complaint  Patient presents with   Medical Management of Chronic Issues    6 month follow up    HPI: Patient is a 78 y.o. male seen today for medical management of chronic diseases.    [place] in [place]  Discussed the use of AI scribe software for clinical note transcription with the patient, who gave verbal consent to proceed.  History of Present Illness   Neck Pain Usually when he is biking Trying to limit his Biking The pain is described as muscle fatigue, particularly when he is in a bent-over position while riding. He has seen Dr Earna Coder for this issue approximately two years ago and was diagnosed with arthritis. He was given exercises to alleviate the pain, but admits to not consistently performing them. He denies any numbness in the fingers or legs, and reports no issues with daily activities or sleep.   he also mentions some memory issues. He acknowledges a slow decline in memory, particularly with remembering names, but does not believe it significantly impacts daily life. He engages in mental exercises such as Sudoku and occasionally crossword puzzles. Has Some issue with  word Findings  CAD He also discusses a recent cardiac CT scan, which revealed moderate coronary artery disease. He is currently on aspirin, metoprolol, and Ziti for this condition. He reports no issues with bicycling, swimming, or walking, and has plans for Echo  Dizziness Especially when he gets up in  the morning and tries to stand up      Past Medical History:  Diagnosis Date   Arthritis    neck   Cataract    Cervicalgia 03/29/2015   History of colonic polyps    Hyperlipidemia    Hypertension    Thrombocytopenia (HCC)    Vitamin D deficiency     Past Surgical History:  Procedure Laterality Date   ARM SKIN LESION BIOPSY / EXCISION Left 02/2016   atypical lentigmous and nested melanocytic proliferation--Dr. Aris Lot   CARDIAC CATHETERIZATION     CATARACT EXTRACTION Bilateral january and february 2018   ORIF SCAPHOID FRACTURE Right 01/24/2022   Procedure: RIGHT OPEN REDUCTION INTERNAL FIXATION (ORIF) SCAPHOID FRACTURE;  Surgeon: Marlyne Beards, MD;  Location: [facility] OR;  Service: Orthopedics;  Laterality: Right;    Allergies  Allergen Reactions   Zocor [Simvastatin] Other (See Comments)    Memory loss   Niaspan [Niacin Er (Antihyperlipidemic)] Hives    Outpatient Encounter Medications as of 01/08/2024  Medication Sig   aspirin EC 81 MG tablet Take 81 mg by mouth daily.   Cholecalciferol 50 MCG (2000 UT) TABS Take 2,000 Units by mouth daily.   ezetimibe (ZETIA) 10 MG tablet Take 1 tablet (10 mg total) by mouth daily.   fenofibrate 160 MG tablet Take 1 tablet (160 mg total) by mouth daily.   hydrochlorothiazide (HYDRODIURIL) 25 MG tablet Take 1 tablet (25 mg total) by mouth daily.   lisinopril (ZESTRIL)  40 MG tablet Take 1 tablet (40 mg total) by mouth daily.   metoprolol tartrate (LOPRESSOR) 25 MG tablet Take 1 tablet (25 mg total) by mouth 2 (two) times daily.   [DISCONTINUED] metoprolol tartrate (LOPRESSOR) 100 MG tablet Take 1 tablet (100 mg total) by mouth once for 1 dose. Take 2 hrs prior to cardiac testing   No facility-administered encounter medications on file as of 01/08/2024.    Review of Systems:  Review of Systems  Constitutional:  Negative for activity change, appetite change and unexpected weight change.  HENT: Negative.    Respiratory:  Negative  for cough and shortness of breath.   Cardiovascular:  Negative for leg swelling.  Gastrointestinal:  Negative for constipation.  Genitourinary:  Negative for frequency.  Musculoskeletal:  Negative for arthralgias, gait problem and myalgias.  Skin: Negative.  Negative for rash.  Neurological:  Negative for dizziness and weakness.  Psychiatric/Behavioral:  Negative for confusion and sleep disturbance.   All other systems reviewed and are negative.   Health Maintenance  Topic Date Due   COVID-19 Vaccine (7 - 2024-25 season) 06/30/2023   Medicare Annual Wellness (AWV)  12/25/2023   Colonoscopy  06/24/2024   DTaP/Tdap/Td (3 - Td or Tdap) 05/27/2028   Pneumonia Vaccine 75+ Years old  Completed   INFLUENZA VACCINE  Completed   Hepatitis C Screening  Completed   Zoster Vaccines- Shingrix  Completed   HPV VACCINES  Aged Out    Physical Exam: Vitals:   01/08/24 1053  BP: 121/87  Pulse: (!) 53  Resp: 18  Temp: 97.6 F (36.4 C)  SpO2: 96%  Weight: 186 lb 1.6 oz (84.4 kg)   Body mass index is 24.22 kg/m. Physical Exam Vitals reviewed.  Constitutional:      Appearance: Normal appearance.  HENT:     Head: Normocephalic.     Nose: Nose normal.     Mouth/Throat:     Mouth: Mucous membranes are moist.     Pharynx: Oropharynx is clear.  Eyes:     Pupils: Pupils are equal, round, and reactive to light.  Cardiovascular:     Rate and Rhythm: Regular rhythm. Bradycardia present.     Pulses: Normal pulses.     Heart sounds: No murmur heard. Pulmonary:     Effort: Pulmonary effort is normal. No respiratory distress.     Breath sounds: Normal breath sounds. No rales.  Abdominal:     General: Abdomen is flat. Bowel sounds are normal.     Palpations: Abdomen is soft.  Musculoskeletal:        General: No swelling.     Cervical back: Neck supple.  Skin:    General: Skin is warm.  Neurological:     General: No focal deficit present.     Mental Status: He is alert and oriented to  person, place, and time.  Psychiatric:        Mood and Affect: Mood normal.        Thought Content: Thought content normal.     Labs reviewed: Basic Metabolic Panel: Recent Labs    07/04/23 0800 09/05/23 0959 01/02/24 0810  NA 144 142 144  K 3.7 3.9 3.9  CL 107 104 108  CO2 26 23 28   GLUCOSE 69 87 89  BUN 26* 17 26*  CREATININE 1.02 1.06 1.08  CALCIUM 8.8 9.3 9.6  TSH 1.17  --   --    Liver Function Tests: Recent Labs    07/04/23 0800 01/02/24 0810  AST 20 19  ALT 18 17  BILITOT 0.5 0.6  PROT 6.3 6.3   No results for input(s): "LIPASE", "AMYLASE" in the last 8760 hours. No results for input(s): "AMMONIA" in the last 8760 hours. CBC: Recent Labs    07/04/23 0800 01/02/24 0810  WBC 5.6 5.6  NEUTROABS 3,970 3,976  HGB 14.6 15.1  HCT 42.9 44.8  MCV 89.2 90.0  PLT 141 156   Lipid Panel: Recent Labs    07/04/23 0800 01/02/24 0810  CHOL 134 126  HDL 53 51  LDLCALC 69 61  TRIG 42 67  CHOLHDL 2.5 2.5   No results found for: "HGBA1C"  Procedures since last visit: No results found.  Assessment/Plan Assessment and Plan    Coronary Artery Disease His most recent coronary CTA showed nonobstructive CAD  managed with aspirin, metoprolol, and Zetia   Occasional dizziness could be due due to metoprolol-induced hypotension and bradycardia. - Continue aspirin, metoprolol, and statin therapy. - echocardiogram.Per Cardiology - Monitor for dizziness or hypotension, especially orthostatic. - Encourage hydration, particularly before exercise. - Monitor home blood pressure and heart rate, especially in the morning. - Consider adjusting lisinopril or hydrochlorothiazide if hypotension persists.  Cervical Arthritis/cervicalgia Cervical arthritis exacerbated during bicycling. Declines further treatment such as injections. - Consider Voltaren gel for symptomatic relief during bicycling. - Encourage adherence to prescribed neck exercises.  Memory Decline Gradual  memory decline without significant impact on daily life. Engages in mental exercises and maintains active social life. - Continue mental exercises like Sudoku and crossword puzzles. - Encourage social engagement and physical activity. HLD LDL good levels with Zetia Does not want to do statin due to memory issues HTN On Lisinopril and HCTZ Toprol just added by Cardiology General Health Maintenance Active lifestyle with bicycling, swimming, and walking. Up to date with flu and COVID vaccinations, pending RSV vaccine. Advised on hydration and sunscreen use. - Administer RSV vaccine. - Ensure adequate hydration, especially before physical activities. - Use sunscreen regularly. - Continue regular follow-ups with ophthalmologist and dermatologist.  Follow-up Scheduled for follow-up in six months with blood work to be completed prior. - Schedule follow-up appointment in six months. - Complete blood work prior to next visit.         Labs/tests ordered:  * No order type specified * Next appt:  Visit date not found

## 2024-01-08 NOTE — Patient Instructions (Signed)
 Check BP couple of times and let me know if it is running Less then 100 HR less then 45 Get RSV for you and Margery Drink water before you go for your Bike ride

## 2024-01-10 ENCOUNTER — Encounter: Payer: Self-pay | Admitting: Internal Medicine

## 2024-01-24 ENCOUNTER — Encounter: Payer: Self-pay | Admitting: Cardiovascular Disease

## 2024-01-24 ENCOUNTER — Ambulatory Visit (HOSPITAL_COMMUNITY): Attending: Internal Medicine

## 2024-01-24 DIAGNOSIS — I2510 Atherosclerotic heart disease of native coronary artery without angina pectoris: Secondary | ICD-10-CM | POA: Insufficient documentation

## 2024-01-24 LAB — ECHOCARDIOGRAM COMPLETE
Area-P 1/2: 3.65 cm2
S' Lateral: 2.9 cm

## 2024-05-04 DIAGNOSIS — H401411 Capsular glaucoma with pseudoexfoliation of lens, right eye, mild stage: Secondary | ICD-10-CM | POA: Diagnosis not present

## 2024-05-04 DIAGNOSIS — Z961 Presence of intraocular lens: Secondary | ICD-10-CM | POA: Diagnosis not present

## 2024-06-24 DIAGNOSIS — L814 Other melanin hyperpigmentation: Secondary | ICD-10-CM | POA: Diagnosis not present

## 2024-06-24 DIAGNOSIS — D1801 Hemangioma of skin and subcutaneous tissue: Secondary | ICD-10-CM | POA: Diagnosis not present

## 2024-06-24 DIAGNOSIS — D034 Melanoma in situ of scalp and neck: Secondary | ICD-10-CM | POA: Diagnosis not present

## 2024-06-24 DIAGNOSIS — Z85828 Personal history of other malignant neoplasm of skin: Secondary | ICD-10-CM | POA: Diagnosis not present

## 2024-06-24 DIAGNOSIS — L570 Actinic keratosis: Secondary | ICD-10-CM | POA: Diagnosis not present

## 2024-06-24 DIAGNOSIS — L821 Other seborrheic keratosis: Secondary | ICD-10-CM | POA: Diagnosis not present

## 2024-06-24 DIAGNOSIS — Z85820 Personal history of malignant melanoma of skin: Secondary | ICD-10-CM | POA: Diagnosis not present

## 2024-07-06 ENCOUNTER — Other Ambulatory Visit

## 2024-07-06 DIAGNOSIS — I10 Essential (primary) hypertension: Secondary | ICD-10-CM | POA: Diagnosis not present

## 2024-07-06 DIAGNOSIS — E782 Mixed hyperlipidemia: Secondary | ICD-10-CM | POA: Diagnosis not present

## 2024-07-06 DIAGNOSIS — I2510 Atherosclerotic heart disease of native coronary artery without angina pectoris: Secondary | ICD-10-CM | POA: Diagnosis not present

## 2024-07-06 DIAGNOSIS — I2583 Coronary atherosclerosis due to lipid rich plaque: Secondary | ICD-10-CM | POA: Diagnosis not present

## 2024-07-06 LAB — COMPREHENSIVE METABOLIC PANEL WITH GFR
AG Ratio: 2.2 (calc) (ref 1.0–2.5)
ALT: 27 U/L (ref 9–46)
AST: 22 U/L (ref 10–35)
Albumin: 4.4 g/dL (ref 3.6–5.1)
Alkaline phosphatase (APISO): 42 U/L (ref 35–144)
BUN: 22 mg/dL (ref 7–25)
CO2: 26 mmol/L (ref 20–32)
Calcium: 9.5 mg/dL (ref 8.6–10.3)
Chloride: 107 mmol/L (ref 98–110)
Creat: 1 mg/dL (ref 0.70–1.28)
Globulin: 2 g/dL (ref 1.9–3.7)
Glucose, Bld: 84 mg/dL (ref 65–99)
Potassium: 3.9 mmol/L (ref 3.5–5.3)
Sodium: 143 mmol/L (ref 135–146)
Total Bilirubin: 0.7 mg/dL (ref 0.2–1.2)
Total Protein: 6.4 g/dL (ref 6.1–8.1)
eGFR: 78 mL/min/1.73m2 (ref >=60)

## 2024-07-06 LAB — CBC WITH DIFFERENTIAL/PLATELET
Absolute Lymphocytes: 1300 {cells}/uL (ref 850–3900)
Absolute Monocytes: 450 {cells}/uL (ref 200–950)
Basophils Absolute: 51 {cells}/uL (ref 0–200)
Basophils Relative: 0.9 %
Eosinophils Absolute: 148 {cells}/uL (ref 15–500)
Eosinophils Relative: 2.6 %
HCT: 47.5 % (ref 38.5–50.0)
Hemoglobin: 15.7 g/dL (ref 13.2–17.1)
MCH: 30.4 pg (ref 27.0–33.0)
MCHC: 33.1 g/dL (ref 32.0–36.0)
MCV: 91.9 fL (ref 80.0–100.0)
MPV: 11.6 fL (ref 7.5–12.5)
Monocytes Relative: 7.9 %
Neutro Abs: 3751 {cells}/uL (ref 1500–7800)
Neutrophils Relative %: 65.8 %
Platelets: 152 Thousand/uL (ref 140–400)
RBC: 5.17 Million/uL (ref 4.20–5.80)
RDW: 12.2 % (ref 11.0–15.0)
Total Lymphocyte: 22.8 %
WBC: 5.7 Thousand/uL (ref 3.8–10.8)

## 2024-07-06 LAB — LIPID PANEL
Cholesterol: 138 mg/dL (ref <200)
HDL: 57 mg/dL (ref >=40)
LDL Cholesterol (Calc): 65 mg/dL
Non-HDL Cholesterol (Calc): 81 mg/dL (ref <130)
Total CHOL/HDL Ratio: 2.4 (calc) (ref <5.0)
Triglycerides: 76 mg/dL (ref <150)

## 2024-07-06 LAB — TSH: TSH: 1.29 m[IU]/L (ref 0.40–4.50)

## 2024-07-08 ENCOUNTER — Non-Acute Institutional Stay: Admitting: Internal Medicine

## 2024-07-08 ENCOUNTER — Encounter: Payer: Self-pay | Admitting: Internal Medicine

## 2024-07-08 VITALS — BP 110/68 | HR 76 | Temp 97.6°F | Ht 73.5 in | Wt 192.3 lb

## 2024-07-08 DIAGNOSIS — E782 Mixed hyperlipidemia: Secondary | ICD-10-CM | POA: Diagnosis not present

## 2024-07-08 DIAGNOSIS — M542 Cervicalgia: Secondary | ICD-10-CM

## 2024-07-08 DIAGNOSIS — Z23 Encounter for immunization: Secondary | ICD-10-CM | POA: Diagnosis not present

## 2024-07-08 DIAGNOSIS — I10 Essential (primary) hypertension: Secondary | ICD-10-CM | POA: Diagnosis not present

## 2024-07-08 MED ORDER — COVID-19 MRNA VACCINE (PFIZER) 30 MCG/0.3ML IM SUSP: 0.3000 mL | Freq: Once | INTRAMUSCULAR | 0 refills | Status: AC

## 2024-07-08 NOTE — Progress Notes (Unsigned)
 Location:  [facility] Service:  Clinic (12)  Provider:   Code Status: *** Goals of Care:     07/10/2023    9:22 AM  Advanced Directives  Does Patient Have a Medical Advance Directive? Yes  Type of Estate agent of [place];Living will;Out of facility DNR (pink MOST or yellow form)  Does patient want to make changes to medical advance directive? No - Patient declined  Copy of Healthcare Power of Attorney in Chart? No - copy requested     Chief Complaint  Patient presents with  . Medical Management of Chronic Issues    6 month f/u. Wants you to look at the varicose veins in his legs. Need for covid, flu(plans to get, postponed) vaccines.     HPI: Patient is a 78 y.o. male seen today for medical management of chronic diseases.     Past Medical History:  Diagnosis Date  . Arthritis    neck  . Cataract   . Cervicalgia 03/29/2015  . History of colonic polyps   . Hyperlipidemia   . Hypertension   . Thrombocytopenia (HCC)   . Vitamin D deficiency     Past Surgical History:  Procedure Laterality Date  . ARM SKIN LESION BIOPSY / EXCISION Left 02/2016   atypical lentigmous and nested melanocytic proliferation--Dr. Ryan Gammon  . CARDIAC CATHETERIZATION    . CATARACT EXTRACTION Bilateral january and february 2018  . ORIF SCAPHOID FRACTURE Right 01/24/2022   Procedure: RIGHT OPEN REDUCTION INTERNAL FIXATION (ORIF) SCAPHOID FRACTURE;  Surgeon: Romona Harari, MD;  Location: [facility] OR;  Service: Orthopedics;  Laterality: Right;    Allergies  Allergen Reactions  . Zocor [Simvastatin] Other (See Comments)    Memory loss  . Niaspan [Niacin Er (Antihyperlipidemic)] Hives    Outpatient Encounter Medications as of 07/08/2024  Medication Sig  . aspirin EC 81 MG tablet Take 81 mg by mouth daily.  . Cholecalciferol 50 MCG (2000 UT) TABS Take 2,000 Units by mouth daily.  . ezetimibe  (ZETIA ) 10 MG tablet Take 1 tablet (10 mg total) by  mouth daily.  . fenofibrate  160 MG tablet Take 1 tablet (160 mg total) by mouth daily.  . hydrochlorothiazide  (HYDRODIURIL ) 25 MG tablet Take 1 tablet (25 mg total) by mouth daily.  . lisinopril  (ZESTRIL ) 40 MG tablet Take 1 tablet (40 mg total) by mouth daily.  . metoprolol  tartrate (LOPRESSOR ) 25 MG tablet Take 1 tablet (25 mg total) by mouth 2 (two) times daily.   No facility-administered encounter medications on file as of 07/08/2024.    Review of Systems:  Review of Systems  Health Maintenance  Topic Date Due  . Medicare Annual Wellness (AWV)  12/25/2023  . Colonoscopy  06/24/2024  . COVID-19 Vaccine (7 - 2025-26 season) 06/29/2024  . Influenza Vaccine  08/28/2024 (Originally 05/29/2024)  . DTaP/Tdap/Td (3 - Td or Tdap) 05/27/2028  . Pneumococcal Vaccine: 50+ Years  Completed  . Hepatitis C Screening  Completed  . Zoster Vaccines- Shingrix  Completed  . HPV VACCINES  Aged Out  . Meningococcal B Vaccine  Aged Out    Physical Exam: Vitals:   07/07/24 1647  BP: 110/68  Pulse: 76  Temp: 97.6 F (36.4 C)  SpO2: 97%  Weight: 192 lb 4.8 oz (87.2 kg)  Height: 6' 1.5 (1.867 m)   Body mass index is 25.03 kg/m. Physical Exam  Labs reviewed: Basic Metabolic Panel: Recent Labs    09/05/23 0959 01/02/24 0810 07/06/24  0830  NA 142 144 143  K 3.9 3.9 3.9  CL 104 108 107  CO2 23 28 26   GLUCOSE 87 89 84  BUN 17 26* 22  CREATININE 1.06 1.08 1.00  CALCIUM 9.3 9.6 9.5  TSH  --   --  1.29   Liver Function Tests: Recent Labs    01/02/24 0810 07/06/24 0830  AST 19 22  ALT 17 27  BILITOT 0.6 0.7  PROT 6.3 6.4   No results for input(s): LIPASE, AMYLASE in the last 8760 hours. No results for input(s): AMMONIA in the last 8760 hours. CBC: Recent Labs    01/02/24 0810 07/06/24 0830  WBC 5.6 5.7  NEUTROABS 3,976 3,751  HGB 15.1 15.7  HCT 44.8 47.5  MCV 90.0 91.9  PLT 156 152   Lipid Panel: Recent Labs    01/02/24 0810 07/06/24 0830  CHOL 126 138  HDL  51 57  LDLCALC 61 65  TRIG 67 76  CHOLHDL 2.5 2.4   No results found for: HGBA1C  Procedures since last visit: No results found.  Assessment/Plan 1. Immunization due (Primary) ***    Labs/tests ordered:  * No order type specified * Next appt:  Visit date not found

## 2024-07-08 NOTE — Patient Instructions (Signed)
 1.) Please have your labs done here at [facility] on 12/28/24 between 7:45-8:15

## 2024-07-16 DIAGNOSIS — Z85828 Personal history of other malignant neoplasm of skin: Secondary | ICD-10-CM | POA: Diagnosis not present

## 2024-07-16 DIAGNOSIS — Z85820 Personal history of malignant melanoma of skin: Secondary | ICD-10-CM | POA: Diagnosis not present

## 2024-07-16 DIAGNOSIS — L988 Other specified disorders of the skin and subcutaneous tissue: Secondary | ICD-10-CM | POA: Diagnosis not present

## 2024-07-16 DIAGNOSIS — D034 Melanoma in situ of scalp and neck: Secondary | ICD-10-CM | POA: Diagnosis not present

## 2024-07-23 ENCOUNTER — Encounter: Payer: Self-pay | Admitting: Gastroenterology

## 2024-08-03 ENCOUNTER — Encounter: Payer: Self-pay | Admitting: Gastroenterology

## 2024-08-03 ENCOUNTER — Other Ambulatory Visit: Payer: Self-pay | Admitting: Cardiovascular Disease

## 2024-08-03 NOTE — Telephone Encounter (Deleted)
 Patient requesting to speak with a nurse in regards to having constipation.   Scheduled for next available with provider. Also requesting sooner apt.   Please further advise. Thank you

## 2024-08-03 NOTE — Telephone Encounter (Signed)
 Brandon [name]

## 2024-08-05 DIAGNOSIS — Z23 Encounter for immunization: Secondary | ICD-10-CM | POA: Diagnosis not present

## 2024-08-24 ENCOUNTER — Encounter: Payer: Self-pay | Admitting: Family

## 2024-08-25 ENCOUNTER — Encounter: Payer: Self-pay | Admitting: Family

## 2024-08-25 ENCOUNTER — Ambulatory Visit (INDEPENDENT_AMBULATORY_CARE_PROVIDER_SITE_OTHER): Admitting: Family

## 2024-08-25 VITALS — BP 124/64 | HR 78 | Temp 98.0°F | Ht 73.5 in | Wt 192.4 lb

## 2024-08-25 DIAGNOSIS — E782 Mixed hyperlipidemia: Secondary | ICD-10-CM | POA: Diagnosis not present

## 2024-08-25 DIAGNOSIS — I10 Essential (primary) hypertension: Secondary | ICD-10-CM | POA: Diagnosis not present

## 2024-08-25 DIAGNOSIS — R051 Acute cough: Secondary | ICD-10-CM | POA: Diagnosis not present

## 2024-08-25 LAB — POC COVID19 BINAXNOW: SARS Coronavirus 2 Ag: NEGATIVE

## 2024-08-25 LAB — POCT INFLUENZA A/B
Influenza A, POC: NEGATIVE
Influenza B, POC: NEGATIVE

## 2024-08-25 MED ORDER — GUAIFENESIN ER 600 MG PO TB12: 600.0000 mg | ORAL_TABLET | Freq: Two times a day (BID) | ORAL | 0 refills | Status: AC

## 2024-08-25 MED ORDER — EZETIMIBE 10 MG PO TABS: 10.0000 mg | ORAL_TABLET | Freq: Every day | ORAL | 0 refills | Status: DC

## 2024-08-25 MED ORDER — DOXYCYCLINE HYCLATE 100 MG PO TABS: 100.0000 mg | ORAL_TABLET | Freq: Two times a day (BID) | ORAL | 0 refills | Status: AC

## 2024-08-25 NOTE — Progress Notes (Signed)
 Provider: Wallis Vancott FNP-C  Charlanne Fredia CROME, MD  Patient Care Team: Charlanne Fredia CROME, MD as PCP - General (Internal Medicine)  Extended Emergency Contact Information Primary Emergency Contact: Knott,Margery Address: [address] [place], [place] [address] [place] Mobile Phone: [phone] Relation: None  Code Status:  Full Code  Goals of care: Advanced Directive information    08/25/2024    8:45 AM  Advanced Directives  Does Patient Have a Medical Advance Directive? Yes  Type of Estate Agent of [place];Living will;Out of facility DNR (pink MOST or yellow form)  Does patient want to make changes to medical advance directive? No - Patient declined  Copy of Healthcare Power of Attorney in Chart? Yes - validated most recent copy scanned in chart (See row information)     Chief Complaint  Patient presents with   Bad Cough    Ongoing for about a week. He did test for covid and flu last week and it was negative for both    Discussed the use of AI scribe software for clinical note transcription with the patient, who gave verbal consent to proceed.  History of Present Illness   Brandon [name] is a 78 year old male who presents with a persistent cough following a camping trip.  His symptoms began a day after a camping trip with his grandson, approximately a week and a half ago. Initially, he experienced a sore throat and a 'gushing' sensation in his head, followed by the onset of a deep cough with minimal sputum production. The cough worsens when lying down, causing difficulty sleeping, but he finds some relief sleeping in a reclined position in a lounge chair. No fever or chills. He denies shortness of breath when lying down but mentions that the cough is triggered, leading to difficulty breathing. The patient describes the cough as deep and has been working to clear it, but reports not coughing up much.  He has a history  of a similar illness about fifteen years ago after a camping trip, which resulted in a prolonged cough lasting two to three months. He tested negative for COVID-19 and influenza last week and again during this visit.  His current medications include metoprolol  25 mg twice daily, lisinopril  40 mg, hydrochlorothiazide  25 mg for blood pressure, fenofibrate  160 mg, Zetia  10 mg for cholesterol, vitamin D 2000 units, and aspirin. He reports no recent changes in his medication regimen. He is allergic to simvastatin and Niaspan. No swelling in his legs, diarrhea, or wheezing. His appetite remains unaffected.   Past Medical History:  Diagnosis Date   Arthritis    neck   Cataract    Cervicalgia 03/29/2015   History of colonic polyps    Hyperlipidemia    Hypertension    Thrombocytopenia    Vitamin D deficiency    Past Surgical History:  Procedure Laterality Date   ARM SKIN LESION BIOPSY / EXCISION Left 02/2016   atypical lentigmous and nested melanocytic proliferation--Dr. Ryan Gammon   CARDIAC CATHETERIZATION     CATARACT EXTRACTION Bilateral january and february 2018   ORIF SCAPHOID FRACTURE Right 01/24/2022   Procedure: RIGHT OPEN REDUCTION INTERNAL FIXATION (ORIF) SCAPHOID FRACTURE;  Surgeon: Romona Harari, MD;  Location: [facility] OR;  Service: Orthopedics;  Laterality: Right;    Allergies  Allergen Reactions   Zocor [Simvastatin] Other (See Comments)    Memory loss   Niaspan [Niacin Er (Antihyperlipidemic)] Hives  Outpatient Encounter Medications as of 08/25/2024  Medication Sig   aspirin EC 81 MG tablet Take 81 mg by mouth daily.   Cholecalciferol 50 MCG (2000 UT) TABS Take 2,000 Units by mouth daily.   doxycycline (VIBRA-TABS) 100 MG tablet Take 1 tablet (100 mg total) by mouth 2 (two) times daily for 7 days.   fenofibrate  160 MG tablet Take 1 tablet (160 mg total) by mouth daily.   guaiFENesin (MUCINEX) 600 MG 12 hr tablet Take 1 tablet (600 mg total) by mouth 2 (two) times  daily for 10 days.   hydrochlorothiazide  (HYDRODIURIL ) 25 MG tablet Take 1 tablet (25 mg total) by mouth daily.   lisinopril  (ZESTRIL ) 40 MG tablet Take 1 tablet (40 mg total) by mouth daily.   metoprolol  tartrate (LOPRESSOR ) 25 MG tablet Take 1 tablet (25 mg total) by mouth 2 (two) times daily.   [DISCONTINUED] ezetimibe  (ZETIA ) 10 MG tablet Take 1 tablet (10 mg total) by mouth daily.   ezetimibe  (ZETIA ) 10 MG tablet Take 1 tablet (10 mg total) by mouth daily.   No facility-administered encounter medications on file as of 08/25/2024.    Review of Systems  Constitutional:  Negative for appetite change, chills, fatigue, fever and unexpected weight change.  HENT:  Positive for rhinorrhea. Negative for congestion, dental problem, ear discharge, ear pain, facial swelling, hearing loss, nosebleeds, postnasal drip, sinus pressure, sinus pain, sneezing, sore throat, tinnitus and trouble swallowing.   Eyes:  Negative for pain, discharge, redness, itching and visual disturbance.  Respiratory:  Positive for cough. Negative for chest tightness, shortness of breath and wheezing.   Cardiovascular:  Negative for chest pain, palpitations and leg swelling.  Gastrointestinal:  Negative for abdominal distention, abdominal pain, blood in stool, constipation, diarrhea, nausea and vomiting.  Genitourinary:  Negative for difficulty urinating, dysuria, flank pain, frequency and urgency.  Musculoskeletal:  Negative for arthralgias, back pain, gait problem, joint swelling, myalgias, neck pain and neck stiffness.  Skin:  Negative for color change, pallor and rash.  Neurological:  Negative for dizziness, weakness, light-headedness, numbness and headaches.    Immunization History  Administered Date(s) Administered   Fluad Quad(high Dose 65+) 06/23/2019   Fluad Trivalent(High Dose 65+) 08/27/2023, 08/05/2024   INFLUENZA, HIGH DOSE SEASONAL PF 08/03/2021, 06/29/2022   Influenza,inj,Quad PF,6+ Mos 07/25/2015,  08/03/2016, 07/29/2018   Influenza-Unspecified 08/29/2017   Moderna Covid-19 Fall Seasonal Vaccine 23yrs & older 07/08/2024   PFIZER Comirnaty(Gray Top)Covid-19 Tri-Sucrose Vaccine 02/17/2021   PFIZER(Purple Top)SARS-COV-2 Vaccination 12/03/2019, 12/28/2019, 07/26/2020   PNEUMOCOCCAL CONJUGATE-20 07/08/2024   Pfizer Covid-19 Vaccine Bivalent Booster 42yrs & up 07/19/2021, 04/11/2022   Pneumococcal Conjugate-13 03/21/2015   Pneumococcal Polysaccharide-23 04/06/2016   RSV,unspecified 09/13/2022   Tdap 10/30/2007, 05/27/2018   Zoster Recombinant(Shingrix) 03/08/2018, 05/27/2018   Zoster, Live 10/29/2012   Pertinent  Health Maintenance Due  Topic Date Due   Colonoscopy  06/24/2024   Influenza Vaccine  Completed      12/24/2022    1:51 PM 01/02/2023    8:09 AM 07/10/2023    8:46 AM 07/10/2023    9:21 AM 08/25/2024    8:39 AM  Fall Risk  Falls in the past year? 0 0 0 0 0  Was there an injury with Fall? 0 0 0 0   Fall Risk Category Calculator 0 0 0 0   Patient at Risk for Falls Due to History of fall(s) No Fall Risks No Fall Risks No Fall Risks No Fall Risks  Fall risk Follow up Falls evaluation completed;Education  provided Falls evaluation completed Falls evaluation completed Falls evaluation completed Falls evaluation completed   Functional Status Survey:    Vitals:   08/24/24 1422  BP: 124/64  Pulse: 78  Temp: 98 F (36.7 C)  TempSrc: Temporal  SpO2: 97%  Weight: 192 lb 6.4 oz (87.3 kg)  Height: 6' 1.5 (1.867 m)   Body mass index is 25.04 kg/m. Physical Exam  VITALS: T- 98.0, P- 78, BP- 124/64, SaO2- 97% MEASUREMENTS: Weight- 192. GENERAL: Alert, cooperative, well developed, no acute distress. HEENT: Normocephalic, normal oropharynx, moist mucous membranes, tympanic membranes normal bilaterally, nose normal, throat slight erythematous, no sinus tenderness. CHEST: Clear to auscultation bilaterally, rhonchi present over bronchus, no wheezes or crackles. CARDIOVASCULAR:  Normal heart rate and rhythm, S1 and S2 normal without murmurs. ABDOMEN: Soft, non-tender, non-distended, without organomegaly, normal bowel sounds. EXTREMITIES: No cyanosis or edema. NEUROLOGICAL: Cranial nerves grossly intact, moves all extremities without gross motor or sensory deficit.   Labs reviewed: Recent Labs    09/05/23 0959 01/02/24 0810 07/06/24 0830  NA 142 144 143  K 3.9 3.9 3.9  CL 104 108 107  CO2 23 28 26   GLUCOSE 87 89 84  BUN 17 26* 22  CREATININE 1.06 1.08 1.00  CALCIUM 9.3 9.6 9.5   Recent Labs    01/02/24 0810 07/06/24 0830  AST 19 22  ALT 17 27  BILITOT 0.6 0.7  PROT 6.3 6.4   Recent Labs    01/02/24 0810 07/06/24 0830  WBC 5.6 5.7  NEUTROABS 3,976 3,751  HGB 15.1 15.7  HCT 44.8 47.5  MCV 90.0 91.9  PLT 156 152   Lab Results  Component Value Date   TSH 1.29 07/06/2024   No results found for: HGBA1C Lab Results  Component Value Date   CHOL 138 07/06/2024   HDL 57 07/06/2024   LDLCALC 65 07/06/2024   TRIG 76 07/06/2024   CHOLHDL 2.4 07/06/2024    Significant Diagnostic Results in last 30 days:  No results found.  Assessment/Plan  Acute bronchitis Symptoms began after camping, with sore throat, cough, and nasal congestion. Negative for COVID-19 and influenza. Cough is deep and productive, worsens when lying down. No fever, chills, or wheezing. Lungs clear on auscultation except for rattling in the bronchus. Differential includes bronchitis and potential progression to pneumonia. - Prescribe doxycycline 100 mg twice daily for 7 days - Advise completion of antibiotic course even if symptoms improve - Recommend Mucinex to loosen mucus, take twice daily for 10 days - Advise increased water intake with Mucinex - Instruct to monitor for worsening symptoms such as shortness of breath, chest tightness, or wheezing and seek emergency care if he occurs  Hypertension Blood pressure well-controlled with metoprolol , lisinopril , and  hydrochlorothiazide . Recent reading 124/64 mmHg. - Continue current antihypertensive regimen  Hyperlipidemia Cholesterol management with fenofibrate  and Zetia . Recent blood work: total cholesterol 138 mg/dL, HDL 57 mg/dL, triglycerides 76 mg/dL, LDL 65 mg/dL. LDL target is 55 mg/dL. Zetia  deemed not significantly effective but advised to continue by PCP. - Send prescription for Zetia  to mail order pharmacy - Continue fenofibrate  and Zetia  as per Dr.Gupta's advice   Family/ staff Communication: Reviewed plan of care with patient verbalized understanding   Labs/tests ordered: None   Next Appointment: Return if symptoms worsen or fail to improve.   Total time: 22 minutes. Greater than 50% of total time spent doing patient education regarding acute bronchitis,HTN,HLD and health maintenance including symptom/medication management.   Roxan JAYSON Plough, NP

## 2024-09-21 ENCOUNTER — Ambulatory Visit: Admitting: Gastroenterology

## 2024-09-21 ENCOUNTER — Encounter: Payer: Self-pay | Admitting: Gastroenterology

## 2024-09-21 ENCOUNTER — Ambulatory Visit (INDEPENDENT_AMBULATORY_CARE_PROVIDER_SITE_OTHER): Admitting: Gastroenterology

## 2024-09-21 VITALS — BP 128/76 | HR 82 | Ht 73.0 in | Wt 192.1 lb

## 2024-09-21 DIAGNOSIS — Z860100 Personal history of colon polyps, unspecified: Secondary | ICD-10-CM

## 2024-09-21 MED ORDER — NA SULFATE-K SULFATE-MG SULF 17.5-3.13-1.6 GM/177ML PO SOLN: 1.0000 | Freq: Once | ORAL | 0 refills | Status: AC

## 2024-09-21 NOTE — Progress Notes (Signed)
 Attending Physician's Attestation   I have reviewed the chart.   I agree with the Advanced Practitioner's note, impression, and recommendations with any updates as below.    Corliss Parish, MD [facility] Advanced Endoscopy Office # [phone]

## 2024-09-21 NOTE — Patient Instructions (Signed)

## 2024-09-21 NOTE — Progress Notes (Signed)
 Brandon [name] [id number] August 30, 1946   Chief Complaint: Discuss colonoscopy  Referring Provider: Charlanne Fredia CROME, MD Primary GI MD: Dr. Wilhelmenia  HPI: Brandon [name] is a 78 y.o. male with past medical history of CAD, HLD, HTN, thrombocytopenia, vitamin D deficiency, colon polyps who presents today to discuss colonoscopy.    Discussed the use of AI scribe software for clinical note transcription with the patient, who gave verbal consent to proceed.  History of Present Illness Brandon [name] is a 78 year old male who presents for a routine colonoscopy.  Colorectal neoplasia surveillance - Undergoing routine colonoscopy due to prior history of colon polyps - Last colonoscopy performed in 2020 - No known family history of colon cancer - Previous colonoscopy raised concern for incomplete bowel preparation  Gastrointestinal symptoms - Occasional acid reflux occurring once every couple of weeks - No difficulty with bowel movements - No rectal bleeding - No abdominal pain - No nausea or vomiting - No dysphagia or sensation of food impaction in throat or chest  Cardiac history - Echocardiogram in March 2025 was normal - Takes aspirin, not on other anticoagulants   Previous GI Procedures/Imaging   Colonoscopy 06/25/2019 - Hemorrhoids found on digital rectal exam.  - The examined portion of the ileum was normal.  - Stool in the entire examined colon. Lavaged with adequate visualization.  - Four 3 to 4 mm polyps at the recto- sigmoid colon, in the sigmoid colon and in the transverse colon, removed with a cold snare. Resected and retrieved.  - Normal mucosa in the entire examined colon.  - Non- bleeding non- thrombosed external and internal hemorrhoids. - Recall 5 years Path: Surgical [P], colon, sigmoid, transverse and rectosigmoid, polyp (4) - SESSILE SERRATED POLYP (2 OF 4 FRAGMENTS) - HYPERPLASTIC POLYP (1 OF 4 FRAGMENTS) - BENIGN COLONIC MUCOSA (1 OF 4  FRAGMENTS) - NO HIGH GRADE DYSPLASIA OR MALIGNANCY IDENTIFIED   Past Medical History:  Diagnosis Date   Arthritis    neck   Cataract    Cervicalgia 03/29/2015   History of colonic polyps    Hyperlipidemia    Hypertension    Thrombocytopenia    Vitamin D deficiency     Past Surgical History:  Procedure Laterality Date   ARM SKIN LESION BIOPSY / EXCISION Left 02/2016   atypical lentigmous and nested melanocytic proliferation--Dr. Ryan Gammon   CARDIAC CATHETERIZATION     CATARACT EXTRACTION Bilateral january and february 2018   ORIF SCAPHOID FRACTURE Right 01/24/2022   Procedure: RIGHT OPEN REDUCTION INTERNAL FIXATION (ORIF) SCAPHOID FRACTURE;  Surgeon: Romona Harari, MD;  Location: [facility] OR;  Service: Orthopedics;  Laterality: Right;    Current Outpatient Medications  Medication Sig Dispense Refill   aspirin EC 81 MG tablet Take 81 mg by mouth daily.     Cholecalciferol 50 MCG (2000 UT) TABS Take 2,000 Units by mouth daily.     ezetimibe  (ZETIA ) 10 MG tablet Take 1 tablet (10 mg total) by mouth daily. 90 tablet 0   fenofibrate  160 MG tablet Take 1 tablet (160 mg total) by mouth daily. 90 tablet 3   hydrochlorothiazide  (HYDRODIURIL ) 25 MG tablet Take 1 tablet (25 mg total) by mouth daily. 90 tablet 3   lisinopril  (ZESTRIL ) 40 MG tablet Take 1 tablet (40 mg total) by mouth daily. 90 tablet 3   metoprolol  tartrate (LOPRESSOR ) 25 MG tablet Take 1 tablet (25 mg total) by mouth 2 (two) times daily. 180 tablet 3   No current facility-administered medications for  this visit.    Allergies as of 09/21/2024 - Review Complete 09/21/2024  Allergen Reaction Noted   Zocor [simvastatin] Other (See Comments) 12/09/2014   Niaspan [niacin er (antihyperlipidemic)] Hives 12/09/2014    Family History  Problem Relation Age of Onset   Heart disease Mother    High Cholesterol Mother    Renal Disease Mother    Stroke Mother 37   Clotting disorder Father    Suicidality Father    Colon  cancer Neg Hx    Esophageal cancer Neg Hx    Stomach cancer Neg Hx    Rectal cancer Neg Hx     Social History   Tobacco Use   Smoking status: Never   Smokeless tobacco: Never  Vaping Use   Vaping status: Never Used  Substance Use Topics   Alcohol use: Not Currently    Comment: ocassional   Drug use: No     Review of Systems:    Constitutional: No fever, chills Cardiovascular: No chest pain Respiratory: No SOB  Gastrointestinal: See HPI and otherwise negative   Physical Exam:  Vital signs: BP 128/76   Pulse 82   Ht 6' 1 (1.854 m)   Wt 192 lb 2 oz (87.1 kg)   BMI 25.35 kg/m   Constitutional: Pleasant, well-appearing male in NAD, alert and cooperative Head:  Normocephalic and atraumatic.  Eyes: No scleral icterus.  Respiratory: Respirations even and unlabored. Lungs clear to auscultation bilaterally.  No wheezes, crackles, or rhonchi.  Cardiovascular:  Regular rate and rhythm. No murmurs. No peripheral edema. Gastrointestinal:  Soft, nondistended, nontender. No rebound or guarding. Normal bowel sounds. No appreciable masses or hepatomegaly. Rectal:  Not performed.  Neurologic:  Alert and oriented x4;  grossly normal neurologically.  Skin:   Dry and intact without significant lesions or rashes. Psychiatric: Oriented to person, place and time. Demonstrates good judgement and reason without abnormal affect or behaviors.   RELEVANT LABS AND IMAGING: CBC    Component Value Date/Time   WBC 5.7 07/06/2024 0830   RBC 5.17 07/06/2024 0830   HGB 15.7 07/06/2024 0830   HGB 14.8 04/04/2016 0910   HCT 47.5 07/06/2024 0830   HCT 43.4 04/04/2016 0910   PLT 152 07/06/2024 0830   PLT 150 04/04/2016 0910   MCV 91.9 07/06/2024 0830   MCV 88 04/04/2016 0910   MCH 30.4 07/06/2024 0830   MCHC 33.1 07/06/2024 0830   RDW 12.2 07/06/2024 0830   RDW 13.6 04/04/2016 0910   LYMPHSABS 1,148 07/04/2023 0800   LYMPHSABS 1.4 04/04/2016 0910   MONOABS 585 05/13/2017 0840   EOSABS 148  07/06/2024 0830   EOSABS 0.1 04/04/2016 0910   BASOSABS 51 07/06/2024 0830   BASOSABS 0.0 04/04/2016 0910    CMP     Component Value Date/Time   NA 143 07/06/2024 0830   NA 142 09/05/2023 0959   K 3.9 07/06/2024 0830   CL 107 07/06/2024 0830   CO2 26 07/06/2024 0830   GLUCOSE 84 07/06/2024 0830   BUN 22 07/06/2024 0830   BUN 17 09/05/2023 0959   CREATININE 1.00 07/06/2024 0830   CALCIUM 9.5 07/06/2024 0830   PROT 6.4 07/06/2024 0830   PROT 6.0 04/04/2016 0910   ALBUMIN 4.0 05/13/2017 0840   ALBUMIN 4.2 04/04/2016 0910   AST 22 07/06/2024 0830   ALT 27 07/06/2024 0830   ALKPHOS 44 05/13/2017 0840   BILITOT 0.7 07/06/2024 0830   BILITOT 0.5 04/04/2016 0910   GFRNONAA >60 01/24/2022 0554  GFRNONAA 66 05/23/2020 0805   GFRAA 76 05/23/2020 0805   Echocardiogram 01/24/2024 1. Left ventricular ejection fraction, by estimation, is 60 to 65% . Left ventricular ejection fraction by 3D volume is 60 % . The left ventricle has normal function. The left ventricle has no regional wall motion abnormalities. Left ventricular diastolic parameters are consistent with Grade I diastolic dysfunction ( impaired relaxation) . The average left ventricular global longitudinal strain is - 22. 4 % . The global longitudinal strain is normal.  2. Right ventricular systolic function is normal. The right ventricular size is mildly enlarged. There is normal pulmonary artery systolic pressure. The estimated right ventricular systolic pressure is 22. 9 mmHg.  3. Normal left atrial reservoir strain 41. 9% .  4. Right atrial size was mildly dilated.  5. The mitral valve is grossly normal. Trivial mitral valve regurgitation. No evidence of mitral stenosis.  6. The aortic valve is tricuspid. There is mild calcification of the aortic valve. Aortic valve regurgitation is not visualized. No aortic stenosis is present.  7. The inferior vena cava is normal in size with greater than 50% respiratory variability, suggesting  right atrial pressure of 3 mmHg.  Assessment/Plan:   Assessment & Plan Personal history of colonic polyps Due for colonoscopy. No current gastrointestinal symptoms.   - Schedule colonoscopy. I thoroughly discussed the procedure with the patient to include nature of the procedure, alternatives, benefits, and risks (including but not limited to bleeding, infection, perforation, anesthesia/cardiac/pulmonary complications). Patient verbalized understanding and gave verbal consent to proceed with procedure.     Camie Furbish, PA-C [facility] 09/21/2024, 9:32 AM  Patient Care Team: Charlanne Fredia CROME, MD as PCP - General (Internal Medicine)

## 2024-10-19 ENCOUNTER — Other Ambulatory Visit: Payer: Self-pay | Admitting: Family

## 2024-10-19 ENCOUNTER — Other Ambulatory Visit: Payer: Self-pay | Admitting: Cardiovascular Disease

## 2024-10-19 ENCOUNTER — Other Ambulatory Visit: Payer: Self-pay | Admitting: Internal Medicine

## 2024-10-19 DIAGNOSIS — E782 Mixed hyperlipidemia: Secondary | ICD-10-CM

## 2024-11-12 ENCOUNTER — Encounter: Payer: Self-pay | Admitting: Internal Medicine

## 2024-11-20 ENCOUNTER — Ambulatory Visit: Admitting: Gastroenterology

## 2024-11-20 ENCOUNTER — Encounter: Payer: Self-pay | Admitting: Gastroenterology

## 2024-11-20 VITALS — BP 103/65 | HR 60 | Temp 97.3°F | Resp 12 | Ht 73.0 in | Wt 192.0 lb

## 2024-11-20 DIAGNOSIS — Z1211 Encounter for screening for malignant neoplasm of colon: Secondary | ICD-10-CM | POA: Diagnosis not present

## 2024-11-20 DIAGNOSIS — Z860100 Personal history of colon polyps, unspecified: Secondary | ICD-10-CM

## 2024-11-20 DIAGNOSIS — Z860101 Personal history of adenomatous and serrated colon polyps: Secondary | ICD-10-CM

## 2024-11-20 DIAGNOSIS — D122 Benign neoplasm of ascending colon: Secondary | ICD-10-CM | POA: Diagnosis not present

## 2024-11-20 DIAGNOSIS — K641 Second degree hemorrhoids: Secondary | ICD-10-CM

## 2024-11-20 DIAGNOSIS — D123 Benign neoplasm of transverse colon: Secondary | ICD-10-CM

## 2024-11-20 DIAGNOSIS — D127 Benign neoplasm of rectosigmoid junction: Secondary | ICD-10-CM | POA: Diagnosis not present

## 2024-11-20 DIAGNOSIS — D128 Benign neoplasm of rectum: Secondary | ICD-10-CM

## 2024-11-20 MED ORDER — SODIUM CHLORIDE 0.9 % IV SOLN: 500.0000 mL | Freq: Once | INTRAVENOUS | Status: DC

## 2024-11-20 NOTE — Op Note (Signed)
 [facility] Patient Name: Brandon [name] Procedure Date: 11/20/2024 10:23 AM MRN: [id number] Endoscopist: Aloha Finner , MD, [id number] Age: 79 Referring MD:  Date of Birth: [birth year] Gender: Male Account #: [id number] Procedure:                Colonoscopy Indications:              High risk colon cancer surveillance: Personal                            history of sessile serrated colon polyp (less than                            10 mm in size) with no dysplasia Medicines:                Monitored Anesthesia Care Procedure:                Pre-Anesthesia Assessment:                           - Prior to the procedure, a History and Physical                            was performed, and patient medications and                            allergies were reviewed. The patient's tolerance of                            previous anesthesia was also reviewed. The risks                            and benefits of the procedure and the sedation                            options and risks were discussed with the patient.                            All questions were answered, and informed consent                            was obtained. Prior Anticoagulants: The patient has                            taken no anticoagulant or antiplatelet agents                            except for aspirin. ASA Grade Assessment: III - A                            patient with severe systemic disease. After                            reviewing the risks and benefits, the patient was  deemed in satisfactory condition to undergo the                            procedure.                           After obtaining informed consent, the colonoscope                            was passed under direct vision. Throughout the                            procedure, the patient's blood pressure, pulse, and                            oxygen saturations were monitored continuously. The                             CF [id number] #[id number] was introduced through the anus                            and advanced to the the cecum, identified by                            appendiceal orifice and ileocecal valve. The                            colonoscopy was performed without difficulty. The                            patient tolerated the procedure. The quality of the                            bowel preparation was good. The ileocecal valve,                            appendiceal orifice, and rectum were photographed. Scope In: 10:40:20 AM Scope Out: 10:54:33 AM Scope Withdrawal Time: 0 hours 11 minutes 56 seconds  Total Procedure Duration: 0 hours 14 minutes 13 seconds  Findings:                 The digital rectal exam findings include                            hemorrhoids. Pertinent negatives include no                            palpable rectal lesions.                           Four sessile polyps were found in the recto-sigmoid                            colon (1), transverse colon (2) and ascending colon                            (  1). The polyps were 2 to 6 mm in size. These                            polyps were removed with a cold snare. Resection                            and retrieval were complete.                           Normal mucosa was found in the entire colon                            otherwise.                           Non-bleeding non-thrombosed internal hemorrhoids                            were found during retroflexion, during perianal                            exam and during digital exam. The hemorrhoids were                            Grade II (internal hemorrhoids that prolapse but                            reduce spontaneously). Complications:            No immediate complications. Estimated Blood Loss:     Estimated blood loss was minimal. Impression:               - Hemorrhoids found on digital rectal exam.                           - Four 2  to 6 mm polyps at the recto-sigmoid colon,                            in the transverse colon and in the ascending colon,                            removed with a cold snare. Resected and retrieved.                           - Normal mucosa in the entire examined colon                            otherwise.                           - Non-bleeding non-thrombosed internal hemorrhoids. Recommendation:           - The patient will be observed post-procedure,                            until all discharge criteria  are met.                           - Discharge patient to home.                           - Patient has a contact number available for                            emergencies. The signs and symptoms of potential                            delayed complications were discussed with the                            patient. Return to normal activities tomorrow.                            Written discharge instructions were provided to the                            patient.                           - High fiber diet.                           - Use FiberCon 1-2 tablets PO daily.                           - Continue present medications.                           - Await pathology results.                           - Repeat colonoscopy in 3/5/7 years for                            surveillance based on pathology results and based                            on patient's health at the time.                           - The findings and recommendations were discussed                            with the patient.                           - The findings and recommendations were discussed                            with the patient's family. Aloha Finner, MD 11/20/2024 10:58:31 AM

## 2024-11-20 NOTE — Progress Notes (Signed)
 Report given to PACU, vss

## 2024-11-20 NOTE — Patient Instructions (Signed)
Await pathology results.    Handout on polyps and hemorrhoids provided.  YOU HAD AN ENDOSCOPIC PROCEDURE TODAY AT [facility]:   Refer to the procedure report that was given to you for any specific questions about what was found during the examination.  If the procedure report does not answer your questions, please call your gastroenterologist to clarify.  If you requested that your care partner not be given the details of your procedure findings, then the procedure report has been included in a sealed envelope for you to review at your convenience later.  YOU SHOULD EXPECT: Some feelings of bloating in the abdomen. Passage of more gas than usual.  Walking can help get rid of the air that was put into your GI tract during the procedure and reduce the bloating. If you had a lower endoscopy (such as a colonoscopy or flexible sigmoidoscopy) you may notice spotting of blood in your stool or on the toilet paper. If you underwent a bowel prep for your procedure, you may not have a normal bowel movement for a few days.  Please Note:  You might notice some irritation and congestion in your nose or some drainage.  This is from the oxygen used during your procedure.  There is no need for concern and it should clear up in a day or so.  SYMPTOMS TO REPORT IMMEDIATELY:  Following lower endoscopy (colonoscopy or flexible sigmoidoscopy):  Excessive amounts of blood in the stool  Significant tenderness or worsening of abdominal pains  Swelling of the abdomen that is new, acute  Fever of 100F or higher   For urgent or emergent issues, a gastroenterologist can be reached at any hour by calling [phone]. Do not use MyChart messaging for urgent concerns.    DIET:  We do recommend a small meal at first, but then you may proceed to your regular diet.  Drink plenty of fluids but you should avoid alcoholic beverages for 24 hours.  ACTIVITY:  You should plan to take it easy for the rest of  today and you should NOT DRIVE or use heavy machinery until tomorrow (because of the sedation medicines used during the test).    FOLLOW UP: Our staff will call the number listed on your records the next business day following your procedure.  We will call around 7:15- 8:00 am to check on you and address any questions or concerns that you may have regarding the information given to you following your procedure. If we do not reach you, we will leave a message.     If any biopsies were taken you will be contacted by phone or by letter within the next 1-3 weeks.  Please call us at [phone] if you have not heard about the biopsies in 3 weeks.    SIGNATURES/CONFIDENTIALITY: You and/or your care partner have signed paperwork which will be entered into your electronic medical record.  These signatures attest to the fact that that the information above on your After Visit Summary has been reviewed and is understood.  Full responsibility of the confidentiality of this discharge information lies with you and/or your care-partner.

## 2024-11-20 NOTE — Progress Notes (Signed)
 Pt's states no medical or surgical changes since previsit or office visit.

## 2024-11-20 NOTE — Progress Notes (Signed)
 "  GASTROENTEROLOGY PROCEDURE H&P NOTE   Primary Care Physician: Charlanne Fredia CROME, MD  HPI: Brandon [name] is a 79 y.o. male  who presents for Colonoscopy for surveillance of prior SSPs.  Past Medical History:  Diagnosis Date   Arthritis    neck   Cataract    Cervicalgia 03/29/2015   History of colonic polyps    Hyperlipidemia    Hypertension    Thrombocytopenia    Vitamin D deficiency    Past Surgical History:  Procedure Laterality Date   ARM SKIN LESION BIOPSY / EXCISION Left 02/2016   atypical lentigmous and nested melanocytic proliferation--Dr. Ryan Gammon   CARDIAC CATHETERIZATION     CATARACT EXTRACTION Bilateral january and february 2018   ORIF SCAPHOID FRACTURE Right 01/24/2022   Procedure: RIGHT OPEN REDUCTION INTERNAL FIXATION (ORIF) SCAPHOID FRACTURE;  Surgeon: Romona Harari, MD;  Location: [facility] OR;  Service: Orthopedics;  Laterality: Right;   Current Outpatient Medications  Medication Sig Dispense Refill   aspirin EC 81 MG tablet Take 81 mg by mouth daily.     Cholecalciferol 50 MCG (2000 UT) TABS Take 2,000 Units by mouth daily.     ezetimibe  (ZETIA ) 10 MG tablet TAKE 1 TABLET EVERY DAY 90 tablet 1   fenofibrate  160 MG tablet TAKE 1 TABLET EVERY DAY 90 tablet 1   hydrochlorothiazide  (HYDRODIURIL ) 25 MG tablet TAKE 1 TABLET EVERY DAY 90 tablet 1   lisinopril  (ZESTRIL ) 40 MG tablet TAKE 1 TABLET EVERY DAY 90 tablet 1   metoprolol  tartrate (LOPRESSOR ) 25 MG tablet TAKE 1 TABLET TWICE DAILY 180 tablet 1   No current facility-administered medications for this visit.   Current Medications[1] Allergies[2] Family History  Problem Relation Age of Onset   Heart disease Mother    High Cholesterol Mother    Renal Disease Mother    Stroke Mother 62   Clotting disorder Father    Suicidality Father    Colon cancer Neg Hx    Esophageal cancer Neg Hx    Stomach cancer Neg Hx    Rectal cancer Neg Hx    Social History   Socioeconomic History   Marital  status: Married    Spouse name: Not on file   Number of children: 2   Years of education: Not on file   Highest education level: Not on file  Occupational History   Occupation: Retired  Tobacco Use   Smoking status: Never   Smokeless tobacco: Never  Vaping Use   Vaping status: Never Used  Substance and Sexual Activity   Alcohol use: Not Currently    Comment: ocassional   Drug use: No   Sexual activity: Not Currently  Other Topics Concern   Not on file  Social History Narrative   Diet- well balanced   Caffeine- Yes (coffee)   Married- Yes, 1990   House- Yes, 1 story with 2 persons   Pets- 1 cat   Current/Past profession- systems analyst   Exercise- Yes ( walking and aerobics)   Living Will- Yes   DNR- N/A   POA/HPOA- N/A      Social Drivers of Health   Tobacco Use: Low Risk (09/21/2024)   Patient History    Smoking Tobacco Use: Never    Smokeless Tobacco Use: Never    Passive Exposure: Not on file  Financial Resource Strain: Low Risk (12/24/2022)   Overall Financial Resource Strain (CARDIA)    Difficulty of Paying Living Expenses: Not hard at all  Food Insecurity: No Food  Insecurity (12/24/2022)   Hunger Vital Sign    Worried About Running Out of Food in the Last Year: Never true    Ran Out of Food in the Last Year: Never true  Transportation Needs: No Transportation Needs (12/24/2022)   PRAPARE - Administrator, Civil Service (Medical): No    Lack of Transportation (Non-Medical): No  Physical Activity: Sufficiently Active (12/24/2022)   Exercise Vital Sign    Days of Exercise per Week: 6 days    Minutes of Exercise per Session: 30 min  Stress: No Stress Concern Present (12/24/2022)   [employer] of Occupational Health - Occupational Stress Questionnaire    Feeling of Stress : Only a little  Social Connections: Moderately Isolated (12/24/2022)   Social Connection and Isolation Panel    Frequency of Communication with Friends and Family: Twice a  week    Frequency of Social Gatherings with Friends and Family: Twice a week    Attends Religious Services: Never    Database Administrator or Organizations: No    Attends Banker Meetings: Never    Marital Status: Married  Catering Manager Violence: Not At Risk (12/24/2022)   Humiliation, Afraid, Rape, and Kick questionnaire    Fear of Current or Ex-Partner: No    Emotionally Abused: No    Physically Abused: No    Sexually Abused: No  Depression (PHQ2-9): Low Risk (08/25/2024)   Depression (PHQ2-9)    PHQ-2 Score: 0  Alcohol Screen: Low Risk (12/24/2022)   Alcohol Screen    Last Alcohol Screening Score (AUDIT): 0  Housing: Low Risk (12/24/2022)   Housing    Last Housing Risk Score: 0  Utilities: Not At Risk (12/24/2022)   AHC Utilities    Threatened with loss of utilities: No  Health Literacy: Not on file    Physical Exam: There were no vitals filed for this visit. There is no height or weight on file to calculate BMI. GEN: NAD EYE: Sclerae anicteric ENT: MMM CV: Non-tachycardic GI: Soft, NT/ND NEURO:  Alert & Oriented x 3  Lab Results: No results for input(s): WBC, HGB, HCT, PLT in the last 72 hours. BMET No results for input(s): NA, K, CL, CO2, GLUCOSE, BUN, CREATININE, CALCIUM in the last 72 hours. LFT No results for input(s): PROT, ALBUMIN, AST, ALT, ALKPHOS, BILITOT, BILIDIR, IBILI in the last 72 hours. PT/INR No results for input(s): LABPROT, INR in the last 72 hours.   Impression / Plan: This is a 79 y.o.male who presents for Colonoscopy for surveillance of prior SSPs.  The risks and benefits of endoscopic evaluation/treatment were discussed with the patient and/or family; these include but are not limited to the risk of perforation, infection, bleeding, missed lesions, lack of diagnosis, severe illness requiring hospitalization, as well as anesthesia and sedation related illnesses.  The patient's history  has been reviewed, patient examined, no change in status, and deemed stable for procedure.  The patient and/or family was provided an opportunity to ask questions and all were answered.  The patient and/or family is agreeable to proceed.    Aloha Finner, MD [facility] Office # [phone]     [1]  Current Outpatient Medications:    aspirin EC 81 MG tablet, Take 81 mg by mouth daily., Disp: , Rfl:    Cholecalciferol 50 MCG (2000 UT) TABS, Take 2,000 Units by mouth daily., Disp: , Rfl:    ezetimibe  (ZETIA ) 10 MG tablet, TAKE 1 TABLET EVERY DAY, Disp:  90 tablet, Rfl: 1   fenofibrate  160 MG tablet, TAKE 1 TABLET EVERY DAY, Disp: 90 tablet, Rfl: 1   hydrochlorothiazide  (HYDRODIURIL ) 25 MG tablet, TAKE 1 TABLET EVERY DAY, Disp: 90 tablet, Rfl: 1   lisinopril  (ZESTRIL ) 40 MG tablet, TAKE 1 TABLET EVERY DAY, Disp: 90 tablet, Rfl: 1   metoprolol  tartrate (LOPRESSOR ) 25 MG tablet, TAKE 1 TABLET TWICE DAILY, Disp: 180 tablet, Rfl: 1 [2]  Allergies Allergen Reactions   Zocor [Simvastatin] Other (See Comments)    Memory loss   Niaspan [Niacin Er (Antihyperlipidemic)] Hives   "

## 2024-11-20 NOTE — Progress Notes (Signed)
 Called to room to assist during endoscopic procedure.  Patient ID and intended procedure confirmed with present staff. Received instructions for my participation in the procedure from the performing physician.

## 2024-11-24 ENCOUNTER — Telehealth: Payer: Self-pay

## 2024-11-24 NOTE — Telephone Encounter (Signed)
" °  Follow up Call-     11/20/2024   10:08 AM  Call back number  Post procedure Call Back phone  # [phone]  Permission to leave phone message Yes     Patient questions:  Do you have a fever, pain , or abdominal swelling? No. Pain Score  0 *  Have you tolerated food without any problems? No.  Have you been able to return to your normal activities? No.  Do you have any questions about your discharge instructions: Diet   Yes.   Medications  Yes.   Follow up visit  Yes.    Do you have questions or concerns about your Care? No.  Actions: * If pain score is 4 or above: No action needed, pain <4.   "

## 2024-11-25 LAB — SURGICAL PATHOLOGY

## 2024-11-26 ENCOUNTER — Ambulatory Visit: Payer: Self-pay | Admitting: Gastroenterology

## 2025-01-06 ENCOUNTER — Encounter: Payer: Self-pay | Admitting: Internal Medicine
